# Patient Record
Sex: Female | Born: 1955
Health system: Southern US, Community
[De-identification: ages and names within clinical notes are randomized; demographics above are authoritative.]

## PROBLEM LIST (undated history)

## (undated) DIAGNOSIS — E785 Hyperlipidemia, unspecified: Secondary | ICD-10-CM

## (undated) DIAGNOSIS — H269 Unspecified cataract: Secondary | ICD-10-CM

## (undated) DIAGNOSIS — Z973 Presence of spectacles and contact lenses: Secondary | ICD-10-CM

## (undated) DIAGNOSIS — H409 Unspecified glaucoma: Secondary | ICD-10-CM

## (undated) DIAGNOSIS — E039 Hypothyroidism, unspecified: Secondary | ICD-10-CM

## (undated) DIAGNOSIS — T148XXA Other injury of unspecified body region, initial encounter: Secondary | ICD-10-CM

## (undated) DIAGNOSIS — E559 Vitamin D deficiency, unspecified: Secondary | ICD-10-CM

## (undated) DIAGNOSIS — J45909 Unspecified asthma, uncomplicated: Secondary | ICD-10-CM

## (undated) DIAGNOSIS — C801 Malignant (primary) neoplasm, unspecified: Secondary | ICD-10-CM

## (undated) DIAGNOSIS — T8859XA Other complications of anesthesia, initial encounter: Secondary | ICD-10-CM

## (undated) DIAGNOSIS — E119 Type 2 diabetes mellitus without complications: Secondary | ICD-10-CM

## (undated) DIAGNOSIS — T7840XA Allergy, unspecified, initial encounter: Secondary | ICD-10-CM

## (undated) DIAGNOSIS — G629 Polyneuropathy, unspecified: Secondary | ICD-10-CM

## (undated) DIAGNOSIS — G5600 Carpal tunnel syndrome, unspecified upper limb: Secondary | ICD-10-CM

## (undated) DIAGNOSIS — Z87442 Personal history of urinary calculi: Secondary | ICD-10-CM

## (undated) DIAGNOSIS — K219 Gastro-esophageal reflux disease without esophagitis: Secondary | ICD-10-CM

## (undated) DIAGNOSIS — T4145XA Adverse effect of unspecified anesthetic, initial encounter: Secondary | ICD-10-CM

## (undated) DIAGNOSIS — I10 Essential (primary) hypertension: Secondary | ICD-10-CM

## (undated) HISTORY — DX: Vitamin D deficiency, unspecified: E55.9

## (undated) HISTORY — DX: Malignant (primary) neoplasm, unspecified: C80.1

## (undated) HISTORY — DX: Unspecified asthma, uncomplicated: J45.909

## (undated) HISTORY — DX: Carpal tunnel syndrome, unspecified upper limb: G56.00

## (undated) HISTORY — DX: Gastro-esophageal reflux disease without esophagitis: K21.9

## (undated) HISTORY — DX: Type 2 diabetes mellitus without complications: E11.9

## (undated) HISTORY — PX: APPENDECTOMY: SHX54

## (undated) HISTORY — DX: Polyneuropathy, unspecified: G62.9

## (undated) HISTORY — DX: Unspecified glaucoma: H40.9

## (undated) HISTORY — PX: TONSILLECTOMY: SUR1361

## (undated) HISTORY — DX: Hypothyroidism, unspecified: E03.9

## (undated) HISTORY — PX: TUBAL LIGATION: SHX77

## (undated) HISTORY — PX: ABDOMINAL HYSTERECTOMY: SHX81

## (undated) HISTORY — DX: Allergy, unspecified, initial encounter: T78.40XA

## (undated) HISTORY — DX: Hyperlipidemia, unspecified: E78.5

---
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
## 1979-03-21 HISTORY — PX: INNER EAR SURGERY: SHX679

## 2000-10-08 ENCOUNTER — Ambulatory Visit (HOSPITAL_COMMUNITY): Admission: RE | Admit: 2000-10-08 | Discharge: 2000-10-08 | Payer: Self-pay | Admitting: Family Medicine

## 2000-10-08 ENCOUNTER — Encounter: Payer: Self-pay | Admitting: Family Medicine

## 2001-04-08 ENCOUNTER — Ambulatory Visit (HOSPITAL_COMMUNITY): Admission: RE | Admit: 2001-04-08 | Discharge: 2001-04-08 | Payer: Self-pay | Admitting: Family Medicine

## 2001-04-08 ENCOUNTER — Encounter: Payer: Self-pay | Admitting: Family Medicine

## 2004-03-20 HISTORY — PX: CARDIAC CATHETERIZATION: SHX172

## 2004-06-22 ENCOUNTER — Emergency Department (HOSPITAL_COMMUNITY): Admission: EM | Admit: 2004-06-22 | Discharge: 2004-06-22 | Payer: Self-pay | Admitting: Emergency Medicine

## 2004-06-23 ENCOUNTER — Ambulatory Visit: Payer: Self-pay | Admitting: Orthopedic Surgery

## 2004-07-20 ENCOUNTER — Ambulatory Visit: Payer: Self-pay | Admitting: Orthopedic Surgery

## 2004-08-22 ENCOUNTER — Ambulatory Visit: Payer: Self-pay | Admitting: Orthopedic Surgery

## 2005-01-30 ENCOUNTER — Ambulatory Visit: Payer: Self-pay | Admitting: Cardiology

## 2005-01-30 ENCOUNTER — Inpatient Hospital Stay (HOSPITAL_COMMUNITY): Admission: EM | Admit: 2005-01-30 | Discharge: 2005-01-31 | Payer: Self-pay | Admitting: Emergency Medicine

## 2005-01-31 ENCOUNTER — Ambulatory Visit: Payer: Self-pay | Admitting: Cardiovascular Disease

## 2005-01-31 ENCOUNTER — Ambulatory Visit (HOSPITAL_COMMUNITY): Admission: AD | Admit: 2005-01-31 | Discharge: 2005-02-01 | Payer: Self-pay | Admitting: Cardiovascular Disease

## 2005-02-14 ENCOUNTER — Ambulatory Visit: Payer: Self-pay | Admitting: Cardiology

## 2005-03-15 ENCOUNTER — Ambulatory Visit (HOSPITAL_COMMUNITY): Admission: RE | Admit: 2005-03-15 | Discharge: 2005-03-15 | Payer: Self-pay | Admitting: Family Medicine

## 2005-12-08 ENCOUNTER — Ambulatory Visit (HOSPITAL_COMMUNITY): Admission: RE | Admit: 2005-12-08 | Discharge: 2005-12-08 | Payer: Self-pay | Admitting: Family Medicine

## 2007-01-25 ENCOUNTER — Emergency Department (HOSPITAL_COMMUNITY): Admission: EM | Admit: 2007-01-25 | Discharge: 2007-01-25 | Payer: Self-pay | Admitting: Emergency Medicine

## 2007-03-21 HISTORY — PX: COLONOSCOPY: SHX174

## 2007-09-19 ENCOUNTER — Ambulatory Visit: Payer: Self-pay | Admitting: Gastroenterology

## 2007-10-08 ENCOUNTER — Encounter: Payer: Self-pay | Admitting: Gastroenterology

## 2007-10-08 ENCOUNTER — Ambulatory Visit: Payer: Self-pay | Admitting: Gastroenterology

## 2007-10-08 ENCOUNTER — Ambulatory Visit (HOSPITAL_COMMUNITY): Admission: RE | Admit: 2007-10-08 | Discharge: 2007-10-08 | Payer: Self-pay | Admitting: Gastroenterology

## 2009-04-12 ENCOUNTER — Ambulatory Visit (HOSPITAL_COMMUNITY): Admission: RE | Admit: 2009-04-12 | Discharge: 2009-04-12 | Payer: Self-pay | Admitting: Family Medicine

## 2009-04-20 ENCOUNTER — Ambulatory Visit (HOSPITAL_COMMUNITY): Admission: RE | Admit: 2009-04-20 | Discharge: 2009-04-20 | Payer: Self-pay | Admitting: Family Medicine

## 2009-10-18 HISTORY — PX: OTHER SURGICAL HISTORY: SHX169

## 2009-10-27 ENCOUNTER — Encounter: Admission: RE | Admit: 2009-10-27 | Discharge: 2009-12-16 | Payer: Self-pay | Admitting: Orthopedic Surgery

## 2010-04-10 ENCOUNTER — Encounter: Payer: Self-pay | Admitting: Family Medicine

## 2010-05-26 ENCOUNTER — Other Ambulatory Visit (HOSPITAL_COMMUNITY): Payer: Self-pay | Admitting: Family Medicine

## 2010-05-26 DIAGNOSIS — Z139 Encounter for screening, unspecified: Secondary | ICD-10-CM

## 2010-05-30 ENCOUNTER — Ambulatory Visit (HOSPITAL_COMMUNITY)
Admission: RE | Admit: 2010-05-30 | Discharge: 2010-05-30 | Disposition: A | Discharge status: Home / Self Care | Payer: Medicare Other | Source: Ambulatory Visit | Attending: Family Medicine | Admitting: Family Medicine

## 2010-05-30 DIAGNOSIS — Z1231 Encounter for screening mammogram for malignant neoplasm of breast: Secondary | ICD-10-CM | POA: Insufficient documentation

## 2010-05-30 DIAGNOSIS — Z139 Encounter for screening, unspecified: Secondary | ICD-10-CM

## 2010-08-02 NOTE — Op Note (Signed)
NAMEDELLANIRA, Maria [name]                ACCOUNT NO.:  [id number]   MEDICAL RECORD NO.:  [id number]          PATIENT TYPE:  AMB   LOCATION:  DAY                           FACILITY:  [facility]   PHYSICIAN:  Kassie Mends, M.D.      DATE OF BIRTH:  September 02, 1955   DATE OF PROCEDURE:  10/08/2007  DATE OF DISCHARGE:                                PROCEDURE NOTE   REFERRING PHYSICIAN:  Donna Bernard, MD   PROCEDURE:  1. Colonoscopy.  2. Esophagogastroduodenoscopy with cold forceps biopsy.   INDICATION FOR EXAM:  Maria [name] is a 55 year old female who presents  with diabetes for 12 years.  She has neuropathy in her feet.  She takes  3-4 Reglan per week for her nausea.  Her nausea primarily occurs in the  morning.  Her heartburn is controlled with her omeprazole.  Her meds  make her nauseous.  She denies any vomiting.   FINDINGS:  1. Tortuous colon requiring multiple changes in position to achieve      successful intubation of the cecum.  Otherwise, no polyps, masses,      inflammatory changes, diverticula or AVMs seen.  Slightly thickened      folds in the sigmoid colon.  Normal retroflexed view of the rectum.  2. Normal esophagus without evidence of Barrett's mass, erosion,      ulceration, or stricture.  3. Patchy erythema in the proximal stomach and the antrum without      erosion or ulceration.  Biopsies obtained via cold forceps to      evaluate for H. pylori gastritis.  4. Normal duodenal bulb and second portion of the duodenum.   DIAGNOSIS:  Mild gastritis, which may be contributing to Maria [name]'  nausea.  The differential diagnosis includes gastroparesis.   RECOMMENDATIONS:  1. She should follow a lactose-free diet to address whether or not her      loose stools may be related to dairy products.  She was given a      handout on lactose-free diet.  She also is to follow a high-fiber      diet.  She was given a handout on high-fiber diet and gastritis.  2. She should continue the  omeprazole.  3. Will follow up with the results of her biopsies.  If her biopsies      reveal no evidence of H. pylori gastritis, then we will order a      gastric emptying study.  4. No aspirin, NSAIDs, or anticoagulation for 5 days.  5. Follow up in 6 weeks with Lorenza Burton regarding her nausea and      loose stools.   MEDICATIONS:  1. Demerol 125 mg IV.  2. Versed 6 mg IV.   PROCEDURE TECHNIQUE:  Physical exam was performed.  Informed consent was  obtained.  The patient explained the benefits, risks and alternatives to  the procedure.  The patient connected to monitor and placed in left  lateral position.  Continuous oxygen was provided via nasal cannula and  IV medicine administered through an indwelling cannula.  After  administration of  sedation and rectal exam, the patient's rectum was  intubated and the scope advanced under direct visualization to the  cecum.  This scope was removed slowly by carefully examining the color,  texture, anatomy and integrity of the mucosa on the way out.   After the colonoscopy, the patient's esophagus was intubated with a  diagnostic gastroscope and the scope was advanced under direct  visualization to the second portion of the duodenum.  This scope was  removed slowly by carefully examining the color, texture, anatomy and  integrity of the mucosa on the way out.  The patient was recovered in  endoscopy and discharged home in satisfactory condition.   PATH:  Reactive gastropathy. Need gastric emptying study to complete  evaluation for nausea. Continue PPI.      Kassie Mends, M.D.  Electronically Signed     SM/MEDQ  D:  10/08/2007  T:  10/08/2007  Job:  [id number]   cc:   Donna Bernard, M.D.  Fax: [phone]

## 2010-08-02 NOTE — Consult Note (Signed)
NAMENARCISSA, MELDER                ACCOUNT NO.:  [id number]   MEDICAL RECORD NO.:  [id number]          PATIENT TYPE:  AMB   LOCATION:  DAY                           FACILITY:  [facility]   PHYSICIAN:  Kassie Mends, M.D.      DATE OF BIRTH:  06-13-1955   DATE OF CONSULTATION:  DATE OF DISCHARGE:                                 CONSULTATION   REFERRING PHYSICIAN:  W. Simone Curia, MD   CHIEF COMPLAINT:  Chronic nausea.   HISTORY OF PRESENT ILLNESS:  Ms. Griffith is a 55 year old Caucasian  female.  She describes a 5-month history of nausea.  She tells me after  she takes her medications first thing in the morning, she has a  significant nausea which worsens throughout the day.  It, around  suppertime, is quite unbearable.  She never has any vomiting, however.  She does have some crawling irritation to her epigastrium  occasionally.  It is not necessarily associated with the nausea.  She is  on omeprazole 40 mg daily and has been for a couple of years.  She has a  history of chronic GERD.  She has been diabetic for about 12 years now.  Her blood sugars do seem to be well controlled at this time, but she has  been skipping her medications regularly due to her nausea.  Her bowel  movements are irregular.  She notes a loose stools a lot.  She has been  having to take Lomotil for about 4 years, and she can have anywhere from  4-6 loose bowel movements per day.  She denies any rectal bleeding or  melena.  She does describe breakthrough heartburn and indigestion.  She  notes early satiety as well.  She tells me her weight has remained  stable.  Her appetite is generally good.  She did have laboratory  studies on July 10, 2007, which showed an alkaline phosphatase of 178  and otherwise normal LFTs.  She did have a trial of Reglan 5 mg q.6 h.  which does seem to help her nausea significantly.   PAST MEDICAL AND SURGICAL HISTORY:  She has a history of hypothyroidism,  was recently checked by Dr.  Gerda Diss.  She has diabetic neuropathy.  She  has history of ear surgery, tonsillectomy, partial hysterectomy with  right salpingo-oophorectomy, and she has had tubal ligation.   CURRENT MEDICATIONS:  1. Metformin 1 g b.i.d.  2. Glipizide 10 mg daily.  3. Lantus 44 units nightly.  4. Levothroid 125 mcg daily.  5. Lipitor 80 mg daily.  6. Gabapentin 200 mg t.i.d.  7. Loratadine 10 mg daily.  8. Omeprazole 40 mg daily.  9. Lomotil p.r.n.  10.Carafate 1 g a.c. and nightly.  11.Reglan 5 mg q.6 h. p.r.n.  12.Sodium bicarbonate 650 mg 2 p.o. p.r.n.  13.Xanax 0.5 mg nightly.  14.Travatan 0.004 mg daily eye drop.  15.Albuterol 90 mcg 2 puffs p.r.n.  16.Advair Discus 250/50 1 puff b.i.d.  17.Albuterol nebulizers p.r.n.   ALLERGIES:  No known drug allergies.   FAMILY HISTORY:  There is no known family history  of colorectal  carcinoma or other chronic GI problems.  Mother deceased at age 12 with  history of diabetes mellitus and pneumonia.  Father is alive with  history of hyperlipidemia, prostate carcinoma, and diabetes mellitus.  She has 2 healthy brothers.   SOCIAL HISTORY:  Ms. Stehr is married.  She has 2 healthy children.  She is disabled.  Previously worked in Designer, fashion/clothing.  She denies any  tobacco, alcohol, or drug use.   REVIEW OF SYSTEMS:  See HPI, otherwise negative.   PHYSICAL EXAMINATION:  VITAL SIGNS:  Weight of 180 pounds, height 5 feet  3 inches, temperature 97.5, blood pressure 122/82, and pulse of 96.  GENERAL:  She is a well-developed, well-nourished, Caucasian female in  no acute distress.  HEENT:  Sclerae are anicteric.  Conjunctivae pink.  Oropharynx pink and  moist without any lesions.  NECK:  Supple without mass or thyromegaly.  HEART:  Regular rate and rhythm.  Normal S1 and S2 without murmurs,  clicks, rubs, or gallops.  LUNGS:  Clear to auscultation bilaterally.  ABDOMEN:  Positive bowel sounds x4.  Protuberant.  No bruits  auscultated.  Soft, nontender,  and nondistended without palpable mass or  hepatosplenomegaly.  No rebound, tenderness, or guarding.  EXTREMITIES:  Without clubbing or edema bilaterally.  SKIN:  Pink, warm, and dry without any rash or jaundice.   IMPRESSION:  Ms. Matthies is a 55 year old Caucasian female with history  of diabetes mellitus who has a 47-month history of chronic nausea without  emesis.  She does occasionally have a crawling irritated epigastric  pain which is not always associated with nausea and history of chronic  gastroesophageal reflux disease.  I do feel she should have an EGD to  rule out peptic ulcer disease or gastritis.  However, she may have  diabetic gastroparesis as the culprit of her symptoms.  Interestingly,  she has an elevated alkaline phosphatase as well, not sure whether this  is related to liver or bone.   She does have chronic intermittent diarrhea for the last 4 years,  differentials include irritable bowel syndrome versus diabetic diarrhea.   PLAN:  1. Colonoscopy and EGD with Dr. Cira Servant in the near future.  I discussed      the procedures including risks and benefits including but not      limited to bleeding, infection, perforation, and drug reaction.      She agrees to the plan and consent will be obtained.  She is to      take half of her metformin and glipizide the night prior to the      colonoscopy as well as half of her dose of Lantus which will be 22      units at bedtime the night prior to the procedure.  She is to bring      her medications to endoscopy for the day of the procedure and take      them after the first early morning in endoscopy appointment per Dr.      Cira Servant' recommendations.  2. If EGD is normal, we would pursue further evaluation her for      gastroparesis in the way of gastric emptying study.   Thank you Dr. Gerda Diss for allowing [place] to participate in the care of Ms.  Fujimoto.      Lorenza Burton, N.P.      Kassie Mends, M.D.  Electronically  Signed    KJ/MEDQ  D:  09/19/2007  T:  09/20/2007  Job:  [id number]   cc:   Donna Bernard, M.D.  Fax: [phone]

## 2010-08-05 NOTE — Cardiovascular Report (Signed)
Maria [name], Maria [name]                ACCOUNT NO.:  [id number]   MEDICAL RECORD NO.:  [id number]          PATIENT TYPE:  OIB   LOCATION:  6526                         FACILITY:  [facility]   PHYSICIAN:  Charlton Haws, M.D.     DATE OF BIRTH:  04/20/1955   DATE OF PROCEDURE:  01/31/2005  DATE OF DISCHARGE:                              CARDIAC CATHETERIZATION   INDICATIONS:  Chest pain, multiple coronary risk factors.   [name] is a 55 year old patient with diabetes. She had a resting  tachycardia prior to the case. I did not give her beta blockers due to her  history of asthma.   Cine catheterization done from right femoral artery.   Left main coronary was normal.   Proximal mid-LAD were normal. The distal LAD was a small vessel without  focal stenosis. First diagonal branch was normal.   Circumflex coronary artery is nondominant and normal.   Right coronary artery was dominant and normal.   RAO VENTRICULOGRAPHY:  RAO ventriculography was normal. EF was 60%. There  was no gradient across the aortic valve. No MR. Aortic pressure was 100/64,  LV pressure was 100/6.   IMPRESSION:  The patient does not have significant coronary artery disease.  She does have a resting tachycardia. I am not sure if this is due to  autonomic neuropathy from her diabetes. We will check a TSH and a T4. I  think she should have formal PFTs pre and post bronchodilator to further  assess her asthma.   The patient tolerated the procedure well.           ______________________________  Charlton Haws, M.D.     PN/MEDQ  D:  01/31/2005  T:  01/31/2005  Job:  [id number]   cc:   Donna Bernard, M.D.  Fax: [phone]   [place] Bing, M.D. [facility]  [address]  [address]  [place]  [place] [address]   [facility]

## 2010-08-05 NOTE — Discharge Summary (Signed)
NAMESHYKERIA, Maria [name] NO.:  [id number]   MEDICAL RECORD NO.:  [id number]           PATIENT TYPE:   LOCATION:  6526                         FACILITY:  [facility]   PHYSICIAN:  [place] Bing, M.D. [facility] OF BIRTH:  1956/02/04   DATE OF ADMISSION:  01/31/2005  DATE OF DISCHARGE:  02/01/2005                                 DISCHARGE SUMMARY   PRIMARY CARDIOLOGIST:  [place] Bing, MD, [facility].   PRIMARY CARE PHYSICIAN:  Donna Bernard, MD.   CHIEF COMPLAINT:  Chest pain, felt noncardiac in nature.   SECONDARY DIAGNOSES:  1.  Diabetes.  2.  Hypothyroidism.  3.  Hyperlipidemia.  4.  Peripheral neuropathy.  5.  Chronic pain.  6.  Family history of coronary artery disease.  7.  Status remote hysterectomy.  8.  Status cervical cancer surgery.  9.  Status tibial fracture repair.  10. Remote history of tobacco use.  11. Asthma.   PROCEDURES:  1.  Cardiac catheterization.  2.  Coronary arteriogram.  3.  Left ventriculogram.   HOSPITAL COURSE:  Ms. Mcfarlane is a 55 year old female with no known history  of coronary artery disease.  She has multiple cardiac risk factors and went  to [facility] with chest pain.  She was evaluated by Dr. Gerda Diss and  admitted.  Dr. Dietrich Pates saw her in consultation and felt that further  evaluation was indicated with catheterization.  She was transferred to [facility] for the procedure.   The cardiac catheterization showed normal coronary arteries and an EF of  60%.  Dr. Eden Emms recommended testing thyroid function studies and also PFTs.  Her D-dimer was tested and was negative.   On February 01, 2005, she was seen by Dr. Dietrich Pates.  He felt that since she  had a negative D-dimer and her cath was clean, she was stable for discharge.  She is to use an Albuterol inhaler p.r.n. for chest tightness, which she  has, and she is to follow up as an outpatient with Cardiology and with Dr.  Gerda Diss.  Ms. Slevin was considered stable  for discharge on February 01, 2005, with outpatient followup arranged.   DISCHARGE INSTRUCTIONS:  1.  Her activity level to be restricted for the next few days.  2.  She is to call the office for problems with the cath site.  3.  She is to stick to a low-fat diet.  4.  She is to follow up with Dr. Marvel Plan PA on November 28th at 1 p.m.      and with Dr. Gerda Diss as needed.   DISCHARGE MEDICATIONS:  1.  Atorvastatin 20 mg a day.  2.  Protonix 40 mg a day.  3.  Avandia 8 mg a day.  4.  Glucotrol 20 mg a day.  5.  Metformin 1 gm b.i.d., restart February 03, 2005.  6.  Advair b.i.d.  7.  Albuterol p.r.n.  8.  Xanax q.h.s.  9.  Synthroid 0.1 mg a day.      Theodore Demark, P.A. [facility]      Sidon Bing, M.D. [facility]  Electronically Signed    RB/MEDQ  D:  02/01/2005  T:  02/01/2005  Job:  [id number]   cc:   Thaxton Bing, M.D. [facility]  [address]  [address]  [place]  [place] [address]   Donna Bernard, M.D.  Fax: [phone]

## 2010-08-05 NOTE — H&P (Signed)
Maria [name], Maria [name]                ACCOUNT NO.:  [id number]   MEDICAL RECORD NO.:  [id number]          PATIENT TYPE:  INP   LOCATION:  A219                          FACILITY:  [facility]   PHYSICIAN:  Donna Bernard, M.D.DATE OF BIRTH:  02-17-1956   DATE OF ADMISSION:  01/30/2005  DATE OF DISCHARGE:  LH                                HISTORY & PHYSICAL   CHIEF COMPLAINT:  Chest pain.   SUBJECTIVE:  This patient is a 55 year old white female with a history of  type 2 diabetes, hyperlipidemia, peripheral neuropathy, hypothyroidism,  asthma, and chronic painful neuropathy who presents to the hospital the day  of admission with complaints of chest pain.  The patient describes it as a  deep pressure in the substernal area of the chest, comes and goes in  severity, at times, has been quite severe.  The patient has some shortness  of breath with it.  No nausea, no diaphoresis.  It does not appear to be  particularly triggered by exertion, but it does not seem to help it either.  The patient notes no stress.  She does note she has had a fair amount of  reflux symptoms recently.  She is compliant with all of her medications  which include, Glucotrol XL 20 mg daily, Levothroid 120 mcg daily, Avandia 8  mg daily, Lipitor 80 mg q.h.s., Glucophage 1000 mg p.o. b.i.d., Ventolin two  sprays q.4h.p.r.n., Protonix 40 mg daily, nortriptyline 50 mg q.h.s., Xanax  0.5 mg q.h.s. p.r.n., Advair 250/50 one puff b.i.d.   ALLERGIES:  No known drug allergies.   FAMILY HISTORY:  Positive for coronary artery disease, diabetes.   PAST SURGICAL HISTORY:  1.  Remote hysterectomy.  2.  Tibial fracture.  3.  Cervical cancer operation.   SOCIAL HISTORY:  The patient is married.  Quit smoking in 1999.  No alcohol  use.  Two children.   REVIEW OF SYSTEMS:  Otherwise negative.   PHYSICAL EXAMINATION:  VITAL SIGNS:  Blood pressure 120/70, afebrile.  GENERAL:  No acute distress.  HEENT:  Normal fundi and disks  sharp.  TM's normal.  Pharynx normal.  NECK:  Supple.  LUNGS:  Clear.  HEART:  Regular rate and rhythm.  No chest wall tenderness.  ABDOMEN:  Mild epigastric tenderness, no rebound, no guarding, obesity  noted.  EXTREMITIES:  Feet diminished sensation bilaterally.  Pulses intact.  Reflexes intact.   LABORATORY DATA:  Cardiac enzymes negative.  Initial EKG normal sinus rhythm  with nonspecific ST changes in V1 through V3.  Other labs, please see  computer.   IMPRESSION:  1.  Chest pain.  Potentially unstable.  Angina by description.  No evidence      of myocardial damage at this point.  2.  Type 2 diabetes.  3.  Hyperlipidemia.  4.  Asth;ma.  5.  Diabetic neuropathy.   PLAN:  1.  Cardiology consultation.  2.  Lovenox b.i.d.  3.  Serial cardiac enzymes.  4.  Nasal cannula O2.  5.  Further orders as noted in the chart.      W.  Simone Curia, M.D.  Electronically Signed     WSL/MEDQ  D:  01/30/2005  T:  01/30/2005  Job:  [id number]

## 2010-08-05 NOTE — Procedures (Signed)
NAMEMERICA, PRELL                ACCOUNT NO.:  [id number]   MEDICAL RECORD NO.:  [id number]          PATIENT TYPE:  OUT   LOCATION:  RESP                          FACILITY:  [facility]   PHYSICIAN:  Edward L. Juanetta Gosling, M.D.DATE OF BIRTH:  [birth year]   DATE OF PROCEDURE:  03/15/2005  DATE OF DISCHARGE:                              PULMONARY FUNCTION TEST   1.  Spirometry shows a minimal ventilatory defect with airflow obstruction      that is most marked in the area of the smaller airways.  2.  Lung volumes are normal.  3.  Diffusion capacity of carbon monoxide is slightly minimally reduced.      This study is consistent with asthma with small airway dysfunction from      cigarette smoking.      Edward L. Juanetta Gosling, M.D.  Electronically Signed     ELH/MEDQ  D:  03/15/2005  T:  03/15/2005  Job:  [id number]   cc:   Lorin Picket A. Gerda Diss, MD  Fax: [phone]

## 2011-05-03 ENCOUNTER — Other Ambulatory Visit: Payer: Self-pay | Admitting: Family Medicine

## 2011-05-03 DIAGNOSIS — Z139 Encounter for screening, unspecified: Secondary | ICD-10-CM

## 2011-06-01 ENCOUNTER — Ambulatory Visit (HOSPITAL_COMMUNITY)
Admission: RE | Admit: 2011-06-01 | Discharge: 2011-06-01 | Disposition: A | Discharge status: Home / Self Care | Payer: Medicare Other | Source: Ambulatory Visit | Attending: Family Medicine | Admitting: Family Medicine

## 2011-06-01 DIAGNOSIS — Z1231 Encounter for screening mammogram for malignant neoplasm of breast: Secondary | ICD-10-CM | POA: Insufficient documentation

## 2011-06-01 DIAGNOSIS — Z139 Encounter for screening, unspecified: Secondary | ICD-10-CM

## 2012-03-04 ENCOUNTER — Other Ambulatory Visit: Payer: Self-pay | Admitting: Family Medicine

## 2012-03-04 DIAGNOSIS — D179 Benign lipomatous neoplasm, unspecified: Secondary | ICD-10-CM

## 2012-03-20 LAB — HM DIABETES EYE EXAM

## 2012-03-25 ENCOUNTER — Ambulatory Visit (HOSPITAL_COMMUNITY)
Admission: RE | Admit: 2012-03-25 | Discharge: 2012-03-25 | Disposition: A | Discharge status: Home / Self Care | Payer: Medicare Other | Source: Ambulatory Visit | Attending: Family Medicine | Admitting: Family Medicine

## 2012-03-25 ENCOUNTER — Other Ambulatory Visit: Payer: Self-pay | Admitting: Family Medicine

## 2012-03-25 DIAGNOSIS — D179 Benign lipomatous neoplasm, unspecified: Secondary | ICD-10-CM

## 2012-03-25 DIAGNOSIS — R222 Localized swelling, mass and lump, trunk: Secondary | ICD-10-CM | POA: Insufficient documentation

## 2012-04-29 ENCOUNTER — Other Ambulatory Visit (HOSPITAL_COMMUNITY): Payer: Self-pay | Admitting: Family Medicine

## 2012-04-29 DIAGNOSIS — Z139 Encounter for screening, unspecified: Secondary | ICD-10-CM

## 2012-05-10 ENCOUNTER — Encounter (HOSPITAL_COMMUNITY): Payer: Self-pay | Admitting: Pharmacy Technician

## 2012-05-17 NOTE — Patient Instructions (Addendum)
Maria [name]  05/17/2012   Your procedure is scheduled on:   05/24/2012  Report to [facility] at  0730 AM.  Call this number if you have problems the morning of surgery: [phone]   Remember:   Do not eat food or drink liquids after midnight.   Take these medicines the morning of surgery with A SIP OF WATER:  Xanax,norco,synthroid,reglan,singulair,prilosec.Take 1/2 Lantus dose night before your procedure. Take your advair and singulair before you come.   Do not wear jewelry, make-up or nail polish.  Do not wear lotions, powders, or perfumes.   Do not shave 48 hours prior to surgery. Men may shave face and neck.  Do not bring valuables to the hospital.  Contacts, dentures or bridgework may not be worn into surgery.  Leave suitcase in the car. After surgery it may be brought to your room.  For patients admitted to the hospital, checkout time is 11:00 AM the day of discharge.   Patients discharged the day of surgery will not be allowed to drive  home.  Name and phone number of your driver: family  Special Instructions: Shower using CHG 2 nights before surgery and the night before surgery.  If you shower the day of surgery use CHG.  Use special wash - you have one bottle of CHG for all showers.  You should use approximately 1/3 of the bottle for each shower.   Please read over the following fact sheets that you were given: Pain Booklet, Coughing and Deep Breathing, MRSA Information, Surgical Site Infection Prevention, Anesthesia Post-op Instructions and Care and Recovery After Surgery Lipoma A lipoma is a noncancerous (benign) tumor composed of fat cells. They are usually found under the skin (subcutaneous). A lipoma may occur in any tissue of the body that contains fat. Common areas for lipomas to appear include the back, shoulders, buttocks, and thighs. Lipomas are a very common soft tissue growth. They are soft and grow slowly. Most problems caused by a lipoma depend on where it is  growing. DIAGNOSIS  A lipoma can be diagnosed with a physical exam. These tumors rarely become cancerous, but radiographic studies can help determine this for certain. Studies used may include:  Computerized X-ray scans (CT or CAT scan).  Computerized magnetic scans (MRI). TREATMENT  Small lipomas that are not causing problems may be watched. If a lipoma continues to enlarge or causes problems, removal is often the best treatment. Lipomas can also be removed to improve appearance. Surgery is done to remove the fatty cells and the surrounding capsule. Most often, this is done with medicine that numbs the area (local anesthetic). The removed tissue is examined under a microscope to make sure it is not cancerous. Keep all follow-up appointments with your caregiver. SEEK MEDICAL CARE IF:   The lipoma becomes larger or hard.  The lipoma becomes painful, red, or increasingly swollen. These could be signs of infection or a more serious condition. Document Released: 02/24/2002 Document Revised: 05/29/2011 Document Reviewed: 08/06/2009 [facility] Patient Information 2013 [place], [place]. PATIENT INSTRUCTIONS POST-ANESTHESIA  IMMEDIATELY FOLLOWING SURGERY:  Do not drive or operate machinery for the first twenty four hours after surgery.  Do not make any important decisions for twenty four hours after surgery or while taking narcotic pain medications or sedatives.  If you develop intractable nausea and vomiting or a severe headache please notify your doctor immediately.  FOLLOW-UP:  Please make an appointment with your surgeon as instructed. You do not need  to follow up with anesthesia unless specifically instructed to do so.  WOUND CARE INSTRUCTIONS (if applicable):  Keep a dry clean dressing on the anesthesia/puncture wound site if there is drainage.  Once the wound has quit draining you may leave it open to air.  Generally you should leave the bandage intact for twenty four hours unless there is  drainage.  If the epidural site drains for more than 36-48 hours please call the anesthesia department.  QUESTIONS?:  Please feel free to call your physician or the hospital operator if you have any questions, and they will be happy to assist you.

## 2012-05-18 ENCOUNTER — Encounter: Payer: Self-pay | Admitting: *Deleted

## 2012-05-20 ENCOUNTER — Encounter (HOSPITAL_COMMUNITY)
Admission: RE | Admit: 2012-05-20 | Discharge: 2012-05-20 | Disposition: A | Discharge status: Home / Self Care | Payer: Medicare Other | Source: Ambulatory Visit | Attending: General Surgery | Admitting: General Surgery

## 2012-05-24 ENCOUNTER — Encounter (HOSPITAL_COMMUNITY): Admission: RE | Payer: Self-pay | Source: Ambulatory Visit

## 2012-05-24 ENCOUNTER — Ambulatory Visit (HOSPITAL_COMMUNITY): Admission: RE | Admit: 2012-05-24 | Payer: Medicare Other | Source: Ambulatory Visit | Admitting: General Surgery

## 2012-05-24 SURGERY — EXCISION LIPOMA
Anesthesia: General | Site: Back | Laterality: Right

## 2012-06-01 ENCOUNTER — Encounter: Payer: Self-pay | Admitting: *Deleted

## 2012-06-03 ENCOUNTER — Ambulatory Visit (HOSPITAL_COMMUNITY)
Admission: RE | Admit: 2012-06-03 | Discharge: 2012-06-03 | Disposition: A | Discharge status: Home / Self Care | Payer: Medicare Other | Source: Ambulatory Visit | Attending: Family Medicine | Admitting: Family Medicine

## 2012-06-03 DIAGNOSIS — Z1231 Encounter for screening mammogram for malignant neoplasm of breast: Secondary | ICD-10-CM | POA: Insufficient documentation

## 2012-06-05 ENCOUNTER — Encounter: Payer: Self-pay | Admitting: Nurse Practitioner

## 2012-06-05 ENCOUNTER — Ambulatory Visit (INDEPENDENT_AMBULATORY_CARE_PROVIDER_SITE_OTHER): Payer: Medicare Other | Admitting: Nurse Practitioner

## 2012-06-05 VITALS — BP 160/84 | HR 100 | Ht 59.5 in | Wt 171.0 lb

## 2012-06-05 MED ORDER — LEVOTHYROXINE SODIUM 112 MCG PO TABS: 112.0000 ug | ORAL_TABLET | Freq: Every day | ORAL | Status: DC

## 2012-06-05 MED ORDER — PREDNISONE 20 MG PO TABS: ORAL_TABLET | ORAL | Status: DC

## 2012-06-06 ENCOUNTER — Encounter: Payer: Self-pay | Admitting: Nurse Practitioner

## 2012-06-06 DIAGNOSIS — E1140 Type 2 diabetes mellitus with diabetic neuropathy, unspecified: Secondary | ICD-10-CM | POA: Insufficient documentation

## 2012-06-06 DIAGNOSIS — J45909 Unspecified asthma, uncomplicated: Secondary | ICD-10-CM | POA: Insufficient documentation

## 2012-06-06 DIAGNOSIS — H409 Unspecified glaucoma: Secondary | ICD-10-CM | POA: Insufficient documentation

## 2012-06-06 DIAGNOSIS — E039 Hypothyroidism, unspecified: Secondary | ICD-10-CM | POA: Insufficient documentation

## 2012-06-06 NOTE — Progress Notes (Signed)
Subjective: Presents for her yearly preventive health physical. No pelvic pain. No chest pain or shortness of breath. Married, same sexual partner. Gets regular eye exams for her glaucoma. Was not able to get Zostavax because her insurance would not cover. Last Pneumovax October 2010. Has had her mammogram. Also lab work. Has had a flareup of her asthma the past 3-4 days. Using her inhaler at least twice per day. Continues her maintenance drugs. Last colonoscopy in 2009. Objective: NAD alert, oriented. TMs normal limit. Thyroid normal limit to palpation and nontender. Pharynx clear. Neck supple with minimal adenopathy. Lungs occasional faint expiratory wheeze noted especially posterior. No tachypnea. Color normal limit. Heart regular rate rhythm. Abdomen obese soft and nontender. Breast exam no masses. Axilla no adenopathy. EGBUS normal. Bimanual exam normal, ovaries nonpalpable. Exam limited due to abdominal girth. Rectal exam normal limit. See lab work dated 3/17 TSH was low. Assessment: #1 preventive health physical #2 hypothyroidism with abnormal TSH #3 asthma exacerbation Plan: #1Decrease levothyroxine to 112 mcg qd.  Recheck TSH in 3 months. #2 Prednisone 20 mg 6d taper.  Call back early next week if no better, call or go to ER sooner if worse.  Hemoccult cards x 3.  Recheck in 3 months.

## 2012-07-25 ENCOUNTER — Other Ambulatory Visit: Payer: Self-pay | Admitting: *Deleted

## 2012-07-25 MED ORDER — OMEPRAZOLE 20 MG PO CPDR: 40.0000 mg | DELAYED_RELEASE_CAPSULE | Freq: Every day | ORAL | Status: DC

## 2012-07-25 MED ORDER — METFORMIN HCL 500 MG PO TABS: 1000.0000 mg | ORAL_TABLET | Freq: Two times a day (BID) | ORAL | Status: DC

## 2012-07-25 MED ORDER — MONTELUKAST SODIUM 10 MG PO TABS: 10.0000 mg | ORAL_TABLET | Freq: Every day | ORAL | Status: DC

## 2012-08-22 ENCOUNTER — Ambulatory Visit: Payer: Medicare Other | Admitting: Nurse Practitioner

## 2012-09-05 ENCOUNTER — Ambulatory Visit (INDEPENDENT_AMBULATORY_CARE_PROVIDER_SITE_OTHER): Payer: Medicare Other | Admitting: Nurse Practitioner

## 2012-09-05 ENCOUNTER — Encounter: Payer: Self-pay | Admitting: Nurse Practitioner

## 2012-09-05 VITALS — BP 130/80 | Wt 171.0 lb

## 2012-09-05 DIAGNOSIS — J45909 Unspecified asthma, uncomplicated: Secondary | ICD-10-CM

## 2012-09-05 DIAGNOSIS — E039 Hypothyroidism, unspecified: Secondary | ICD-10-CM

## 2012-09-05 DIAGNOSIS — E1149 Type 2 diabetes mellitus with other diabetic neurological complication: Secondary | ICD-10-CM

## 2012-09-05 DIAGNOSIS — G589 Mononeuropathy, unspecified: Secondary | ICD-10-CM

## 2012-09-05 DIAGNOSIS — J4520 Mild intermittent asthma, uncomplicated: Secondary | ICD-10-CM

## 2012-09-05 DIAGNOSIS — E1140 Type 2 diabetes mellitus with diabetic neuropathy, unspecified: Secondary | ICD-10-CM

## 2012-09-05 DIAGNOSIS — E119 Type 2 diabetes mellitus without complications: Secondary | ICD-10-CM

## 2012-09-05 NOTE — Progress Notes (Signed)
Subjective:  Presents for routine followup. Blood sugars at home running 103-119 in the mornings. Occasionally will drop into the 90s which causes her to feel jittery with headache and feeling very tired. About 5 times per month has periods of hypoglycemia during the night which makes her feel "foggy" and sweaty. Her husband has to fix her a sandwich or snack, these episodes will go away after 20-30 minutes. Currently on 40 units of Lantus at bedtime. Tries to eat a bedtime snack each day. Blood sugars after eating have run about 180-200. No chest pain/ischemic type pain shortness of breath or edema. Asthma has been stable, tries to avoid being out in the hot weather. Limited exercise. Had her thyroid medication adjusted in March, is due for a repeat TSH.  Objective:   BP 130/80  Wt 171 lb (77.565 kg)  BMI 33.97 kg/m2 NAD. Alert, oriented. Lungs clear. Heart regular rate rhythm. Lower extremities no edema. Thyroid normal limit to palpation and nontender. No obvious masses or goiters. Hemoglobin A1c 8.5.  Assessment:Type 2 diabetes mellitus with diabetic neuropathy - Plan: Microalbumin, urine, Microalbumin, urine  Type II or unspecified type diabetes mellitus without mention of complication, not stated as uncontrolled - Plan: POCT glycosylated hemoglobin (Hb A1C)  Unspecified hypothyroidism - Plan: TSH, TSH  Asthma, chronic, mild intermittent, with acute exacerbation  Plan: Continue current medications as directed. Patient recently had her Lantus filled. Once her Lantus supply is gone, would like to try NPH insulin to see if this will help avoid nighttime hypoglycemia. Also recommend high protein bedtime snack. Part of her elevation in hemoglobin A1c was probably due to her recent prednisone taper due to asthma exacerbation. Lengthy discussion regarding her diet and exercise. Recheck in 3 months, call back sooner if any problems.

## 2012-09-06 ENCOUNTER — Encounter: Payer: Self-pay | Admitting: Nurse Practitioner

## 2012-09-06 NOTE — Assessment & Plan Note (Signed)
Plan: Continue current medications as directed. Patient recently had her Lantus filled. Once her Lantus supply is gone, would like to try NPH insulin to see if this will help avoid nighttime hypoglycemia. Also recommend high protein bedtime snack. Part of her elevation in hemoglobin A1c was probably due to her recent prednisone taper due to asthma exacerbation. Lengthy discussion regarding her diet and exercise. Recheck in 3 months, call back sooner if any problems.

## 2012-09-06 NOTE — Assessment & Plan Note (Signed)
TSH pending. 

## 2012-09-10 ENCOUNTER — Other Ambulatory Visit: Payer: Self-pay | Admitting: *Deleted

## 2012-09-10 DIAGNOSIS — E039 Hypothyroidism, unspecified: Secondary | ICD-10-CM

## 2012-09-10 DIAGNOSIS — E0590 Thyrotoxicosis, unspecified without thyrotoxic crisis or storm: Secondary | ICD-10-CM

## 2012-09-10 MED ORDER — LEVOTHYROXINE SODIUM 112 MCG PO TABS: ORAL_TABLET | ORAL | Status: DC

## 2012-09-26 ENCOUNTER — Other Ambulatory Visit: Payer: Self-pay | Admitting: *Deleted

## 2012-09-26 MED ORDER — METFORMIN HCL 500 MG PO TABS: 1000.0000 mg | ORAL_TABLET | Freq: Two times a day (BID) | ORAL | Status: DC

## 2012-09-27 ENCOUNTER — Telehealth: Payer: Self-pay | Admitting: Family Medicine

## 2012-09-27 ENCOUNTER — Other Ambulatory Visit: Payer: Self-pay | Admitting: *Deleted

## 2012-09-27 MED ORDER — HYDROCODONE-ACETAMINOPHEN 10-325 MG PO TABS: 1.0000 | ORAL_TABLET | Freq: Four times a day (QID) | ORAL | Status: DC | PRN

## 2012-09-27 NOTE — Telephone Encounter (Addendum)
Rx was already faxed to pharmacy today.

## 2012-09-27 NOTE — Telephone Encounter (Signed)
Patient said pharmacy received refills of all her medicine except her hydrocodone. She needs this refilled to [facility] Today if possible

## 2012-10-25 ENCOUNTER — Other Ambulatory Visit: Payer: Self-pay

## 2012-10-25 MED ORDER — MONTELUKAST SODIUM 10 MG PO TABS: 10.0000 mg | ORAL_TABLET | Freq: Every day | ORAL | Status: DC

## 2012-10-25 MED ORDER — OMEPRAZOLE 20 MG PO CPDR: 40.0000 mg | DELAYED_RELEASE_CAPSULE | Freq: Every day | ORAL | Status: DC

## 2012-11-05 ENCOUNTER — Encounter: Payer: Self-pay | Admitting: Family Medicine

## 2012-11-05 ENCOUNTER — Ambulatory Visit (INDEPENDENT_AMBULATORY_CARE_PROVIDER_SITE_OTHER): Payer: Medicare Other | Admitting: Family Medicine

## 2012-11-05 VITALS — BP 120/70 | Temp 98.1°F | Ht 61.0 in | Wt 169.0 lb

## 2012-11-05 DIAGNOSIS — L0390 Cellulitis, unspecified: Secondary | ICD-10-CM

## 2012-11-05 MED ORDER — KETOCONAZOLE 2 % EX CREA: TOPICAL_CREAM | Freq: Two times a day (BID) | CUTANEOUS | Status: DC

## 2012-11-05 MED ORDER — DOXYCYCLINE HYCLATE 100 MG PO CAPS: 100.0000 mg | ORAL_CAPSULE | Freq: Two times a day (BID) | ORAL | Status: DC

## 2012-11-05 NOTE — Patient Instructions (Signed)
Cellulitis Cellulitis is an infection of the skin and the tissue beneath it. The infected area is usually red and tender. Cellulitis occurs most often in the arms and lower legs.  CAUSES  Cellulitis is caused by bacteria that enter the skin through cracks or cuts in the skin. The most common types of bacteria that cause cellulitis are Staphylococcus and Streptococcus. SYMPTOMS   Redness and warmth.  Swelling.  Tenderness or pain.  Fever. DIAGNOSIS  Your caregiver can usually determine what is wrong based on a physical exam. Blood tests may also be done. TREATMENT  Treatment usually involves taking an antibiotic medicine. HOME CARE INSTRUCTIONS   Take your antibiotics as directed. Finish them even if you start to feel better.  Keep the infected arm or leg elevated to reduce swelling.  Apply a warm cloth to the affected area up to 4 times per day to relieve pain.  Only take over-the-counter or prescription medicines for pain, discomfort, or fever as directed by your caregiver.  Keep all follow-up appointments as directed by your caregiver. SEEK MEDICAL CARE IF:   You notice red streaks coming from the infected area.  Your red area gets larger or turns dark in color.  Your bone or joint underneath the infected area becomes painful after the skin has healed.  Your infection returns in the same area or another area.  You notice a swollen bump in the infected area.  You develop new symptoms. SEEK IMMEDIATE MEDICAL CARE IF:   You have a fever.  You feel very sleepy.  You develop vomiting or diarrhea.  You have a general ill feeling (malaise) with muscle aches and pains. MAKE SURE YOU:   Understand these instructions.  Will watch your condition.  Will get help right away if you are not doing well or get worse. Document Released: 12/14/2004 Document Revised: 09/05/2011 Document Reviewed: 05/22/2011 [employer] Patient Information 2014 [employer].  

## 2012-11-05 NOTE — Progress Notes (Signed)
  Subjective:    Patient ID: Maria [name], female    DOB: 03-05-1956, 57 y.o.   MRN: [id number]  HPIHere for right ring finger pain and swelling. Started last Friday.  Patient relates of the ring finger become red tender painful she states that in the past she had been diagnosed with a skin fungus infection and then one time she had a bacterial infection she would like a refill on her skin fungus medication sheet econazole. She denies high fever chills denies any injury. PMH benign. Has multiple chronic health problems for which she follows up.  Review of Systems    no high fevers. Objective:   Physical Exam Lungs are clear hearts regular she does have cellulitis of the ring finger. No abscess is noted.       Assessment & Plan:  Cellulitis ring finger-do not where reaming. Doxycycline twice a day 10 days proper way to take it discuss, if high fevers or worse followup, if abscess develops followup. Recheck in 48 hours if not improving

## 2012-11-12 ENCOUNTER — Ambulatory Visit (INDEPENDENT_AMBULATORY_CARE_PROVIDER_SITE_OTHER): Payer: Medicare Other | Admitting: Family Medicine

## 2012-11-12 ENCOUNTER — Encounter: Payer: Self-pay | Admitting: Family Medicine

## 2012-11-12 VITALS — BP 148/68 | Temp 97.8°F | Ht 61.0 in | Wt 170.4 lb

## 2012-11-12 DIAGNOSIS — L0390 Cellulitis, unspecified: Secondary | ICD-10-CM

## 2012-11-12 DIAGNOSIS — L0291 Cutaneous abscess, unspecified: Secondary | ICD-10-CM

## 2012-11-12 MED ORDER — VALACYCLOVIR HCL 1 G PO TABS: 1000.0000 mg | ORAL_TABLET | Freq: Two times a day (BID) | ORAL | Status: AC

## 2012-11-12 MED ORDER — MOMETASONE FUROATE 0.1 % EX CREA: TOPICAL_CREAM | CUTANEOUS | Status: DC

## 2012-11-12 NOTE — Progress Notes (Signed)
  Subjective:    Patient ID: Maria [name], female    DOB: 20-Jun-1955, 57 y.o.   MRN: [id number]  HPI Pt back today from previous visit on 8/19 related to cellulitis in the right, ring finger. She has been taking her antibiotics and using the cream, but the cellulitis is still present. This Saturday is when it got worst to where she developed two tiny red bumps on her hand (thumb and ring finger). She says it is itchy and painful.   She is almost finished with the doxycycline and seems to be helping recently she noticed a couple small bumps.  Review of Systems She denies fevers chills she does relate moderate pain    Objective:   Physical Exam The area on the hand has a couple red spots on the back of the knuckle and on the palm there is no sign of any splinter hemorrhages no signs of significant cellulitis I feel that the patient is actually improving compared to where she was       Assessment & Plan:  It is hard to discern if this is a possibility shingles coming out or go localized reaction I would recommend Kenalog cream twice a day when necessary plus also recommend Valtrex 1 twice daily for 7 days, finish doxycycline. Keep standard followup visits

## 2012-11-28 ENCOUNTER — Other Ambulatory Visit: Payer: Self-pay

## 2012-11-28 MED ORDER — METFORMIN HCL 500 MG PO TABS: 1000.0000 mg | ORAL_TABLET | Freq: Two times a day (BID) | ORAL | Status: DC

## 2012-12-10 ENCOUNTER — Ambulatory Visit: Payer: Medicare Other | Admitting: Nurse Practitioner

## 2012-12-12 ENCOUNTER — Ambulatory Visit (INDEPENDENT_AMBULATORY_CARE_PROVIDER_SITE_OTHER): Payer: Medicare Other | Admitting: Nurse Practitioner

## 2012-12-12 ENCOUNTER — Encounter: Payer: Self-pay | Admitting: Nurse Practitioner

## 2012-12-12 VITALS — BP 122/74 | Ht 62.0 in | Wt 173.0 lb

## 2012-12-12 DIAGNOSIS — E119 Type 2 diabetes mellitus without complications: Secondary | ICD-10-CM

## 2012-12-12 DIAGNOSIS — E1149 Type 2 diabetes mellitus with other diabetic neurological complication: Secondary | ICD-10-CM

## 2012-12-12 DIAGNOSIS — Z23 Encounter for immunization: Secondary | ICD-10-CM

## 2012-12-12 DIAGNOSIS — E785 Hyperlipidemia, unspecified: Secondary | ICD-10-CM

## 2012-12-12 DIAGNOSIS — Z79899 Other long term (current) drug therapy: Secondary | ICD-10-CM

## 2012-12-12 DIAGNOSIS — G589 Mononeuropathy, unspecified: Secondary | ICD-10-CM

## 2012-12-12 DIAGNOSIS — E1140 Type 2 diabetes mellitus with diabetic neuropathy, unspecified: Secondary | ICD-10-CM

## 2012-12-12 DIAGNOSIS — E039 Hypothyroidism, unspecified: Secondary | ICD-10-CM

## 2012-12-12 LAB — HEPATIC FUNCTION PANEL
ALT: 17 U/L (ref 0–35)
AST: 16 U/L (ref 0–37)
Bilirubin, Direct: 0.2 mg/dL (ref 0.0–0.3)
Indirect Bilirubin: 0.6 mg/dL (ref 0.0–0.9)
Total Bilirubin: 0.8 mg/dL (ref 0.3–1.2)
Total Protein: 6.5 g/dL (ref 6.0–8.3)

## 2012-12-12 LAB — POCT GLYCOSYLATED HEMOGLOBIN (HGB A1C): Hemoglobin A1C: 7.3

## 2012-12-12 LAB — LIPID PANEL
Cholesterol: 191 mg/dL (ref 0–200)
Triglycerides: 106 mg/dL (ref <150)

## 2012-12-12 MED ORDER — INSULIN NPH (HUMAN) (ISOPHANE) 100 UNIT/ML ~~LOC~~ SUSP: SUBCUTANEOUS | Status: DC

## 2012-12-12 NOTE — Progress Notes (Signed)
Subjective:  Presents for recheck. Wants to try NPH insulin. Experiencing hypoglycemia mainly in AM with BS below 70 on many mornings. Was recently treated for infection in her hand. FBS this AM 216. No chest pain/ischemic type pain or shortness of breath. No edema. Has had a slight flare up of her asthma, using her albuterol to 3 times per day which relieves her symptoms. Continues to use her Advair twice a day. Is due to have her TSH rechecked, on a reduced dose of levothyroxine.  Objective:   BP 122/74  Ht 5\' 2"  (1.575 m)  Wt 173 lb (78.472 kg)  BMI 31.63 kg/m2 NAD. Alert, oriented. Lungs clear. Heart regular rate rhythm. Lower extremities no edema. Hemoglobin A1c 7.3, much improved from 8.5 3 months ago.   Assessment: Diabetes - Plan: POCT glycosylated hemoglobin (Hb A1C)  Hypothyroidism - Plan: TSH  Hyperlipemia - Plan: Lipid panel  High risk medication use - Plan: Hepatic function panel  Need for prophylactic vaccination and inoculation against influenza  Plan: Complete Lantus insulin. After this switch to NPH 32 units in the morning and 16 units at night. Monitor blood sugars closely and send results. Reviewed signs and symptoms and care for hypoglycemia. Continue current medications. Callback in 7-10 days if no improvement in her asthma. Routine followup in 3 months.

## 2012-12-13 DIAGNOSIS — E1169 Type 2 diabetes mellitus with other specified complication: Secondary | ICD-10-CM | POA: Insufficient documentation

## 2012-12-13 DIAGNOSIS — E785 Hyperlipidemia, unspecified: Secondary | ICD-10-CM | POA: Insufficient documentation

## 2012-12-13 NOTE — Assessment & Plan Note (Signed)
Recheck TSH 

## 2012-12-13 NOTE — Assessment & Plan Note (Signed)
  Plan: Complete Lantus insulin. After this switch to NPH 32 units in the morning and 16 units at night. Monitor blood sugars closely and send results. Reviewed signs and symptoms and care for hypoglycemia. Continue current medications. Callback in 7-10 days if no improvement in her asthma. Routine followup in 3 months.

## 2012-12-16 ENCOUNTER — Other Ambulatory Visit: Payer: Self-pay | Admitting: Nurse Practitioner

## 2012-12-16 MED ORDER — LEVOTHYROXINE SODIUM 88 MCG PO TABS: 88.0000 ug | ORAL_TABLET | Freq: Every day | ORAL | Status: DC

## 2012-12-31 ENCOUNTER — Other Ambulatory Visit: Payer: Self-pay | Admitting: *Deleted

## 2012-12-31 MED ORDER — ALPRAZOLAM 0.5 MG PO TABS: 0.5000 mg | ORAL_TABLET | Freq: Every evening | ORAL | Status: DC | PRN

## 2013-01-27 ENCOUNTER — Other Ambulatory Visit: Payer: Self-pay | Admitting: *Deleted

## 2013-01-27 ENCOUNTER — Telehealth: Payer: Self-pay | Admitting: Family Medicine

## 2013-01-27 MED ORDER — HYDROCODONE-ACETAMINOPHEN 10-325 MG PO TABS: 1.0000 | ORAL_TABLET | Freq: Four times a day (QID) | ORAL | Status: DC | PRN

## 2013-01-27 MED ORDER — PRAVASTATIN SODIUM 80 MG PO TABS: 80.0000 mg | ORAL_TABLET | Freq: Every day | ORAL | Status: DC

## 2013-01-27 NOTE — Telephone Encounter (Signed)
Patient needs a prescription for HYDROcodone-acetaminophen (NORCO) 10-325 MG per tablet.  States she is completely out of this medication.  Please call Patient. Thanks

## 2013-01-27 NOTE — Telephone Encounter (Signed)
I called patient and refilled prescription for HYDROcodone-acetaminophen (NORCO) 10-325 MG per tablet X 3 for her to pick up.

## 2013-01-27 NOTE — Telephone Encounter (Signed)
Write one month worth times two.

## 2013-02-04 ENCOUNTER — Telehealth: Payer: Self-pay | Admitting: Family Medicine

## 2013-02-04 NOTE — Telephone Encounter (Signed)
Pt does not like the [place], she forgets to take it. She wants to go back to the lantus where she only has to do one a day. Please call into [facility] and home care

## 2013-02-05 NOTE — Telephone Encounter (Signed)
Patient called to check on this

## 2013-02-13 NOTE — Telephone Encounter (Signed)
Call pt. Tell her I apologize for delay her phone call was at bottom and i did not see after initial review, how much insulin total is she averaging per day? And how are her first thing in the morning sugars doing?p

## 2013-02-14 MED ORDER — INSULIN GLARGINE 100 UNIT/ML ~~LOC~~ SOLN: 38.0000 [IU] | Freq: Every day | SUBCUTANEOUS | Status: DC

## 2013-02-14 NOTE — Telephone Encounter (Signed)
Rx sent electronically to [facility]. Patient notified and advised to start with 38 units at bedtime and call back in one week with sugar readings.

## 2013-02-14 NOTE — Telephone Encounter (Signed)
Ok swithc back to lantus at 38 qhs calll [place] in one wk with numbers

## 2013-02-14 NOTE — Telephone Encounter (Signed)
Patient taking 32 units in am and 16 units in pm- Morning sugars running 102-115

## 2013-03-10 ENCOUNTER — Telehealth: Payer: Self-pay | Admitting: Family Medicine

## 2013-03-10 NOTE — Telephone Encounter (Signed)
Patient needs order for blood work. °

## 2013-03-17 ENCOUNTER — Ambulatory Visit: Payer: Medicare Other | Admitting: Family Medicine

## 2013-03-17 ENCOUNTER — Other Ambulatory Visit: Payer: Self-pay | Admitting: Nurse Practitioner

## 2013-03-17 ENCOUNTER — Ambulatory Visit: Payer: Medicare Other | Admitting: Nurse Practitioner

## 2013-03-17 DIAGNOSIS — E039 Hypothyroidism, unspecified: Secondary | ICD-10-CM

## 2013-03-17 DIAGNOSIS — Z0000 Encounter for general adult medical examination without abnormal findings: Secondary | ICD-10-CM

## 2013-03-17 DIAGNOSIS — E1140 Type 2 diabetes mellitus with diabetic neuropathy, unspecified: Secondary | ICD-10-CM

## 2013-03-17 NOTE — Progress Notes (Signed)
Pt notified on voicemail that orders are ready.  

## 2013-03-24 ENCOUNTER — Encounter: Payer: Self-pay | Admitting: Nurse Practitioner

## 2013-03-24 ENCOUNTER — Ambulatory Visit (INDEPENDENT_AMBULATORY_CARE_PROVIDER_SITE_OTHER): Payer: Medicare Other | Admitting: Nurse Practitioner

## 2013-03-24 VITALS — BP 136/86 | Ht 62.0 in | Wt 174.4 lb

## 2013-03-24 DIAGNOSIS — E785 Hyperlipidemia, unspecified: Secondary | ICD-10-CM

## 2013-03-24 DIAGNOSIS — K219 Gastro-esophageal reflux disease without esophagitis: Secondary | ICD-10-CM

## 2013-03-24 DIAGNOSIS — G589 Mononeuropathy, unspecified: Secondary | ICD-10-CM

## 2013-03-24 DIAGNOSIS — E039 Hypothyroidism, unspecified: Secondary | ICD-10-CM

## 2013-03-24 DIAGNOSIS — J45909 Unspecified asthma, uncomplicated: Secondary | ICD-10-CM

## 2013-03-24 DIAGNOSIS — E1149 Type 2 diabetes mellitus with other diabetic neurological complication: Secondary | ICD-10-CM

## 2013-03-24 DIAGNOSIS — E1140 Type 2 diabetes mellitus with diabetic neuropathy, unspecified: Secondary | ICD-10-CM

## 2013-03-24 LAB — MICROALBUMIN, URINE: Microalb, Ur: 1.06 mg/dL (ref 0.00–1.89)

## 2013-03-24 LAB — TSH: TSH: 0.201 u[IU]/mL — ABNORMAL LOW (ref 0.350–4.500)

## 2013-03-24 MED ORDER — LEVOTHYROXINE SODIUM 50 MCG PO TABS: 50.0000 ug | ORAL_TABLET | Freq: Every day | ORAL | Status: DC

## 2013-03-24 MED ORDER — HYDROCODONE-ACETAMINOPHEN 10-325 MG PO TABS: 1.0000 | ORAL_TABLET | Freq: Four times a day (QID) | ORAL | Status: DC | PRN

## 2013-03-24 NOTE — Progress Notes (Signed)
Will send in new Rx for new dose of thyroid medication. Will reduce to 50 mcg. Repeat TSH in 3 months.

## 2013-03-24 NOTE — Progress Notes (Signed)
Discussed with patient. Patient verbalized understanding. 

## 2013-03-26 ENCOUNTER — Telehealth: Payer: Self-pay | Admitting: Family Medicine

## 2013-03-26 NOTE — Telephone Encounter (Signed)
[employer] called to state that patient's ALPRAZOLAM has been approved, expires 03/26/2014, they stated that the patient has been notified and a letter of approval will be sent to our office

## 2013-03-27 ENCOUNTER — Encounter: Payer: Self-pay | Admitting: Nurse Practitioner

## 2013-03-27 NOTE — Progress Notes (Signed)
Subjective:  Presents for recheck on her diabetes. Patient kept forgetting her morning injection of NPH insulin, blood sugars went up. Started Lantus back about a month ago. Currently on 38 units in the evening. Sugar has run 112-165. Gets regular eye exams. No changes in her feet. Continues to experience numbness at times with occasional cramping. No change in her breathing. Shortness of breath and wheezing slightly improved. No chest pain or ischemic type pain. Staying active. Rare exercise. Doing very well with her diet. No regular dental care.  Objective:   BP 136/86  Ht 5\' 2"  (1.575 m)  Wt 174 lb 6.4 oz (79.107 kg)  BMI 31.89 kg/m2 NAD. Alert, oriented. Thyroid no masses or goiter noted; nontender. Lungs occasional faint expiratory wheeze, no tachypnea. Otherwise clear. Heart regular rate rhythm. Abdomen soft mildly obese nondistended nontender. Feet: No edema noted. Strong DP pulses. Toes warm with good capillary refill. Monofilament test decreased sensation in both great toes bilaterally otherwise normal.  Assessment:Type 2 diabetes mellitus with diabetic neuropathy  Asthma, chronic  GERD (gastroesophageal reflux disease)  Hyperlipidemia LDL goal < 100  Patient has paperwork to get her lab work done this morning. Continue Lantus as directed. Strongly encourage dental if patient can afford it. Recommend regular activity as tolerated. Reviewed diabetic footcare. Recheck in 3 months, call back sooner if any problems. Meds ordered this encounter  Medications  . DISCONTD: HYDROcodone-acetaminophen (NORCO) 10-325 MG per tablet    Sig: Take 1 tablet by mouth every 6 (six) hours as needed.    Dispense:  30 tablet    Refill:  0    Order Specific Question:  Supervising Provider    Answer:  Mikey Kirschner [[id number]]  . DISCONTD: HYDROcodone-acetaminophen (NORCO) 10-325 MG per tablet    Sig: Take 1 tablet by mouth every 6 (six) hours as needed.    Dispense:  30 tablet    Refill:  0    May  fill 30 days from 03/24/13    Order Specific Question:  Supervising Provider    Answer:  Mikey Kirschner [[id number]]  . HYDROcodone-acetaminophen (NORCO) 10-325 MG per tablet    Sig: Take 1 tablet by mouth every 6 (six) hours as needed.    Dispense:  30 tablet    Refill:  0    May fill 60 days from 03/24/13    Order Specific Question:  Supervising Provider    Answer:  Mikey Kirschner [[id number]]  . levothyroxine (SYNTHROID, LEVOTHROID) 50 MCG tablet    Sig: Take 1 tablet (50 mcg total) by mouth daily before breakfast.    Dispense:  30 tablet    Refill:  2    Order Specific Question:  Supervising Provider    Answer:  Mikey Kirschner [[id number]]  . LEADER INSULIN SYRINGE 31G X 5/16" 0.5 ML MISC    Sig:

## 2013-03-27 NOTE — Assessment & Plan Note (Signed)
Patient has paperwork to get her lab work done this morning. Continue Lantus as directed. Strongly encourage dental if patient can afford it. Recommend regular activity as tolerated. Reviewed diabetic footcare. Recheck in 3 months, call back sooner if any problems.

## 2013-03-27 NOTE — Assessment & Plan Note (Signed)
Labwork pending  

## 2013-03-27 NOTE — Assessment & Plan Note (Signed)
Stable  Continue current regimen  

## 2013-03-27 NOTE — Assessment & Plan Note (Signed)
Lab work pending. Continue levothyroxine.

## 2013-04-21 ENCOUNTER — Telehealth: Payer: Self-pay | Admitting: Family Medicine

## 2013-04-21 MED ORDER — INSULIN GLARGINE 100 UNIT/ML ~~LOC~~ SOLN: 38.0000 [IU] | Freq: Every day | SUBCUTANEOUS | Status: DC

## 2013-04-21 NOTE — Telephone Encounter (Signed)
Pt notified and med sent

## 2013-04-21 NOTE — Telephone Encounter (Signed)
Patient needs Rx for 2 bottles of lancets because she will run out before her 30 days.   Maria [name]

## 2013-04-25 ENCOUNTER — Other Ambulatory Visit: Payer: Self-pay | Admitting: *Deleted

## 2013-04-25 MED ORDER — PRAVASTATIN SODIUM 80 MG PO TABS: 80.0000 mg | ORAL_TABLET | Freq: Every day | ORAL | Status: DC

## 2013-04-28 ENCOUNTER — Other Ambulatory Visit: Payer: Self-pay | Admitting: *Deleted

## 2013-04-28 MED ORDER — GLIPIZIDE ER 10 MG PO TB24: 10.0000 mg | ORAL_TABLET | Freq: Every day | ORAL | Status: DC

## 2013-05-02 ENCOUNTER — Other Ambulatory Visit: Payer: Self-pay | Admitting: Family Medicine

## 2013-05-02 DIAGNOSIS — Z1231 Encounter for screening mammogram for malignant neoplasm of breast: Secondary | ICD-10-CM

## 2013-05-22 ENCOUNTER — Other Ambulatory Visit: Payer: Self-pay

## 2013-05-22 MED ORDER — FLUTICASONE-SALMETEROL 500-50 MCG/DOSE IN AEPB: 1.0000 | INHALATION_SPRAY | Freq: Two times a day (BID) | RESPIRATORY_TRACT | Status: DC

## 2013-06-05 ENCOUNTER — Inpatient Hospital Stay (HOSPITAL_COMMUNITY): Admission: RE | Admit: 2013-06-05 | Payer: Medicare Other | Source: Ambulatory Visit

## 2013-06-16 ENCOUNTER — Ambulatory Visit (HOSPITAL_COMMUNITY)
Admission: RE | Admit: 2013-06-16 | Discharge: 2013-06-16 | Disposition: A | Discharge status: Home / Self Care | Payer: Medicare Other | Source: Ambulatory Visit | Attending: Family Medicine | Admitting: Family Medicine

## 2013-06-16 DIAGNOSIS — Z1231 Encounter for screening mammogram for malignant neoplasm of breast: Secondary | ICD-10-CM | POA: Insufficient documentation

## 2013-06-23 ENCOUNTER — Other Ambulatory Visit: Payer: Self-pay | Admitting: Family Medicine

## 2013-06-23 ENCOUNTER — Telehealth: Payer: Self-pay | Admitting: Family Medicine

## 2013-06-23 ENCOUNTER — Other Ambulatory Visit: Payer: Self-pay | Admitting: Nurse Practitioner

## 2013-06-23 ENCOUNTER — Encounter: Payer: Medicare Other | Admitting: Nurse Practitioner

## 2013-06-23 DIAGNOSIS — E785 Hyperlipidemia, unspecified: Secondary | ICD-10-CM

## 2013-06-23 DIAGNOSIS — Z79899 Other long term (current) drug therapy: Secondary | ICD-10-CM

## 2013-06-23 DIAGNOSIS — E119 Type 2 diabetes mellitus without complications: Secondary | ICD-10-CM

## 2013-06-23 DIAGNOSIS — E039 Hypothyroidism, unspecified: Secondary | ICD-10-CM

## 2013-06-23 NOTE — Telephone Encounter (Signed)
Patient needs order for blood work. She has an appointment for Wednesday.

## 2013-06-23 NOTE — Telephone Encounter (Signed)
tsh lip liv A1C met7

## 2013-06-23 NOTE — Telephone Encounter (Signed)
03/24/13 TSH, microalb urine 12/12/12  TSH, lipid, liver, microalb urine, A1C

## 2013-06-23 NOTE — Telephone Encounter (Signed)
Ok plus 5 monthly ref 

## 2013-06-23 NOTE — Telephone Encounter (Signed)
bloodwork orders ready. Pt notified.  

## 2013-06-25 ENCOUNTER — Encounter: Payer: Self-pay | Admitting: Nurse Practitioner

## 2013-06-25 ENCOUNTER — Ambulatory Visit (INDEPENDENT_AMBULATORY_CARE_PROVIDER_SITE_OTHER): Payer: Medicare Other | Admitting: Nurse Practitioner

## 2013-06-25 VITALS — BP 144/74 | HR 84 | Ht 61.0 in | Wt 175.0 lb

## 2013-06-25 DIAGNOSIS — E1140 Type 2 diabetes mellitus with diabetic neuropathy, unspecified: Secondary | ICD-10-CM

## 2013-06-25 DIAGNOSIS — Z23 Encounter for immunization: Secondary | ICD-10-CM | POA: Diagnosis not present

## 2013-06-25 DIAGNOSIS — Z0000 Encounter for general adult medical examination without abnormal findings: Secondary | ICD-10-CM

## 2013-06-25 DIAGNOSIS — E1149 Type 2 diabetes mellitus with other diabetic neurological complication: Secondary | ICD-10-CM

## 2013-06-25 DIAGNOSIS — Z01419 Encounter for gynecological examination (general) (routine) without abnormal findings: Secondary | ICD-10-CM

## 2013-06-25 DIAGNOSIS — J322 Chronic ethmoidal sinusitis: Secondary | ICD-10-CM

## 2013-06-25 LAB — LIPID PANEL
CHOL/HDL RATIO: 4.8 ratio
Cholesterol: 186 mg/dL (ref 0–200)
HDL: 39 mg/dL — AB (ref >39)
LDL CALC: 118 mg/dL — AB (ref 0–99)
Triglycerides: 144 mg/dL (ref <150)
VLDL: 29 mg/dL (ref 0–40)

## 2013-06-25 LAB — HEPATIC FUNCTION PANEL
ALBUMIN: 3.8 g/dL (ref 3.5–5.2)
ALT: 16 U/L (ref 0–35)
AST: 18 U/L (ref 0–37)
Alkaline Phosphatase: 126 U/L — ABNORMAL HIGH (ref 39–117)
BILIRUBIN TOTAL: 0.7 mg/dL (ref 0.2–1.2)
Bilirubin, Direct: 0.1 mg/dL (ref 0.0–0.3)
Indirect Bilirubin: 0.6 mg/dL (ref 0.2–1.2)
Total Protein: 6.1 g/dL (ref 6.0–8.3)

## 2013-06-25 LAB — BASIC METABOLIC PANEL
BUN: 13 mg/dL (ref 6–23)
CHLORIDE: 108 meq/L (ref 96–112)
CO2: 26 mEq/L (ref 19–32)
CREATININE: 0.58 mg/dL (ref 0.50–1.10)
Calcium: 8.9 mg/dL (ref 8.4–10.5)
GLUCOSE: 112 mg/dL — AB (ref 70–99)
POTASSIUM: 3.9 meq/L (ref 3.5–5.3)
Sodium: 140 mEq/L (ref 135–145)

## 2013-06-25 LAB — TSH: TSH: 15.05 u[IU]/mL — AB (ref 0.350–4.500)

## 2013-06-25 LAB — HEMOGLOBIN A1C
Hgb A1c MFr Bld: 7.8 % — ABNORMAL HIGH (ref <5.7)
Mean Plasma Glucose: 177 mg/dL — ABNORMAL HIGH (ref <117)

## 2013-06-25 MED ORDER — HYDROCODONE-ACETAMINOPHEN 10-325 MG PO TABS: 1.0000 | ORAL_TABLET | Freq: Four times a day (QID) | ORAL | Status: DC | PRN

## 2013-06-25 MED ORDER — AMOXICILLIN-POT CLAVULANATE 875-125 MG PO TABS: 1.0000 | ORAL_TABLET | Freq: Two times a day (BID) | ORAL | Status: DC

## 2013-06-25 NOTE — Patient Instructions (Addendum)
OTC antihistamine Magnesium daily as directed    Diabetes and Foot Care Diabetes may cause you to have problems because of poor blood supply (circulation) to your feet and legs. This may cause the skin on your feet to become thinner, break easier, and heal more slowly. Your skin may become dry, and the skin may peel and crack. You may also have nerve damage in your legs and feet causing decreased feeling in them. You may not notice minor injuries to your feet that could lead to infections or more serious problems. Taking care of your feet is one of the most important things you can do for yourself.  HOME CARE INSTRUCTIONS  Wear shoes at all times, even in the house. Do not go barefoot. Bare feet are easily injured.  Check your feet daily for blisters, cuts, and redness. If you cannot see the bottom of your feet, use a mirror or ask someone for help.  Wash your feet with warm water (do not use hot water) and mild soap. Then pat your feet and the areas between your toes until they are completely dry. Do not soak your feet as this can dry your skin.  Apply a moisturizing lotion or petroleum jelly (that does not contain alcohol and is unscented) to the skin on your feet and to dry, brittle toenails. Do not apply lotion between your toes.  Trim your toenails straight across. Do not dig under them or around the cuticle. File the edges of your nails with an emery board or nail file.  Do not cut corns or calluses or try to remove them with medicine.  Wear clean socks or stockings every day. Make sure they are not too tight. Do not wear knee-high stockings since they may decrease blood flow to your legs.  Wear shoes that fit properly and have enough cushioning. To break in new shoes, wear them for just a few hours a day. This prevents you from injuring your feet. Always look in your shoes before you put them on to be sure there are no objects inside.  Do not cross your legs. This may decrease the  blood flow to your feet.  If you find a minor scrape, cut, or break in the skin on your feet, keep it and the skin around it clean and dry. These areas may be cleansed with mild soap and water. Do not cleanse the area with peroxide, alcohol, or iodine.  When you remove an adhesive bandage, be sure not to damage the skin around it.  If you have a wound, look at it several times a day to make sure it is healing.  Do not use heating pads or hot water bottles. They may burn your skin. If you have lost feeling in your feet or legs, you may not know it is happening until it is too late.  Make sure your health care provider performs a complete foot exam at least annually or more often if you have foot problems. Report any cuts, sores, or bruises to your health care provider immediately. SEEK MEDICAL CARE IF:   You have an injury that is not healing.  You have cuts or breaks in the skin.  You have an ingrown nail.  You notice redness on your legs or feet.  You feel burning or tingling in your legs or feet.  You have pain or cramps in your legs and feet.  Your legs or feet are numb.  Your feet always feel cold. SEEK IMMEDIATE  MEDICAL CARE IF:   There is increasing redness, swelling, or pain in or around a wound.  There is a red line that goes up your leg.  Pus is coming from a wound.  You develop a fever or as directed by your health care provider.  You notice a bad smell coming from an ulcer or wound. Document Released: 03/03/2000 Document Revised: 11/06/2012 Document Reviewed: 08/13/2012 [facility] Patient Information 2014 [employer].

## 2013-06-26 ENCOUNTER — Encounter: Payer: Self-pay | Admitting: Nurse Practitioner

## 2013-06-26 NOTE — Progress Notes (Signed)
Subjective:    Patient ID: Maria [name], female    DOB: 05-Oct-1955, 58 y.o.   MRN: [id number]  HPI presents for wellness exam. Regular eye exams. Wears a full denture plate on top, no regular dental exams. Denies any oral sores or lesions. Has had ethmoid sinus pressure for the past 2 weeks. Unable to cough up mucus. Using her inhaler about 2-3 times per day over the past few days. Tends to have trouble when the weather changes. Continues to use her Advair and other medications, see list. Blood sugars running 110, rarely to 15 depending on what she eats. Regular activity. Does fairly well with her diet.    Review of Systems  Constitutional: Negative for fever, activity change, appetite change and fatigue.  HENT: Positive for congestion, postnasal drip, rhinorrhea and sinus pressure. Negative for dental problem, ear pain, mouth sores and sore throat.   Respiratory: Positive for cough and wheezing. Negative for chest tightness and shortness of breath.   Cardiovascular: Negative for chest pain and leg swelling.  Gastrointestinal: Negative for nausea, vomiting, abdominal pain, diarrhea, constipation and blood in stool.  Genitourinary: Negative for dysuria, urgency, frequency, vaginal discharge, enuresis, difficulty urinating, genital sores, vaginal pain and pelvic pain.  Neurological: Positive for headaches.       Objective:   Physical Exam  Vitals reviewed. Constitutional: She is oriented to person, place, and time. She appears well-developed. No distress.  HENT:  Right Ear: External ear normal.  Left Ear: External ear normal.  Mouth/Throat: Oropharynx is clear and moist.  TMs clear effusion, no erythema. Pharynx mildly injected with PND noted.  Neck: Normal range of motion. Neck supple. No tracheal deviation present. No thyromegaly present.  Mild anterior cervical adenopathy  Cardiovascular: Normal rate, regular rhythm, normal heart sounds and intact distal pulses.  Exam reveals no  gallop.   No murmur heard. Pulmonary/Chest: Effort normal and breath sounds normal. No respiratory distress. She has no wheezes. She has no rales.  Abdominal: Soft. She exhibits no distension and no mass. There is no tenderness. There is no rebound and no guarding.  Genitourinary: Vagina normal. No vaginal discharge found.  External GU: no rashes or lesions; bimanual exam normal; no obvious masses but limited due to extreme abd girth.  Musculoskeletal: She exhibits no edema.  Lymphadenopathy:    She has cervical adenopathy.  Neurological: She is alert and oriented to person, place, and time.  See diabetic foot exam.  Skin: Skin is warm and dry. No rash noted.  Psychiatric: She has a normal mood and affect. Her behavior is normal.          Assessment & Plan:   Problem List Items Addressed This Visit     Endocrine   Type 2 diabetes mellitus with diabetic neuropathy    Other Visit Diagnoses   Well woman exam    -  Primary    Need for prophylactic vaccination with combined diphtheria-tetanus-pertussis (DTP) vaccine        Relevant Orders       Tdap vaccine greater than or equal to 7yo IM (Completed)    Routine general medical examination at a health care facility        Relevant Orders       POC Hemoccult Bld/Stl (3-Cd Home Screen)    Ethmoid sinusitis        Relevant Medications       amoxicillin-clavulanate (AUGMENTIN) tablet 875-125 mg        Meds ordered this  encounter  Medications  . DISCONTD: HYDROcodone-acetaminophen (NORCO) 10-325 MG per tablet    Sig: Take 1 tablet by mouth every 6 (six) hours as needed.    Dispense:  30 tablet    Refill:  0    May fill 60 days from 06/25/13    Order Specific Question:  Supervising Provider    Answer:  Mikey Kirschner [[id number]]  . DISCONTD: HYDROcodone-acetaminophen (NORCO) 10-325 MG per tablet    Sig: Take 1 tablet by mouth every 6 (six) hours as needed.    Dispense:  30 tablet    Refill:  0    May fill 30 days from 06/25/13     Order Specific Question:  Supervising Provider    Answer:  Mikey Kirschner [[id number]]  . HYDROcodone-acetaminophen (NORCO) 10-325 MG per tablet    Sig: Take 1 tablet by mouth every 6 (six) hours as needed.    Dispense:  30 tablet    Refill:  0    Order Specific Question:  Supervising Provider    Answer:  Mikey Kirschner [[id number]]  . amoxicillin-clavulanate (AUGMENTIN) 875-125 MG per tablet    Sig: Take 1 tablet by mouth 2 (two) times daily.    Dispense:  20 tablet    Refill:  0    Order Specific Question:  Supervising Provider    Answer:  Mikey Kirschner [[id number]]   continue current meds as directed. Encouraged healthy diabetic diet. Continue activity. Patient had lab work done this morning. Call back next week if no improvement in her breathing, sooner if worse. Recheck in 3 months, call back sooner if any problems.

## 2013-07-03 ENCOUNTER — Other Ambulatory Visit: Payer: Self-pay

## 2013-07-03 MED ORDER — LEVOTHYROXINE SODIUM 75 MCG PO TABS: 75.0000 ug | ORAL_TABLET | Freq: Every day | ORAL | Status: DC

## 2013-07-14 ENCOUNTER — Other Ambulatory Visit: Payer: Self-pay | Admitting: Family Medicine

## 2013-07-15 ENCOUNTER — Telehealth: Payer: Self-pay | Admitting: Family Medicine

## 2013-07-15 NOTE — Telephone Encounter (Signed)
Patient notified

## 2013-07-15 NOTE — Telephone Encounter (Signed)
Patient is calling to request a prescription for ear drops, because it is draining. She has used ofloxacin (FLOXIN) 0.3 % otic solution in the past.  [employer].

## 2013-07-15 NOTE — Telephone Encounter (Signed)
Ok, same, five drops bid affected ear

## 2013-07-22 ENCOUNTER — Other Ambulatory Visit: Payer: Self-pay | Admitting: Family Medicine

## 2013-07-22 ENCOUNTER — Other Ambulatory Visit: Payer: Self-pay | Admitting: *Deleted

## 2013-07-22 MED ORDER — ONDANSETRON 4 MG PO TBDP: 4.0000 mg | ORAL_TABLET | Freq: Four times a day (QID) | ORAL | Status: DC | PRN

## 2013-07-22 NOTE — Telephone Encounter (Signed)
Pt states Dr. Richardson Landry started this med several years ago. She states she does not take on a regular basis but sometimes has to take 2 - 3 per week. Pt requesting something different for nausea if not able to refill reglan.

## 2013-07-22 NOTE — Telephone Encounter (Signed)
Try instead new nausea med zofr odt 4 mg thirty one q 6hrs prn nausea

## 2013-07-22 NOTE — Telephone Encounter (Signed)
Who originally started this?? This med is falling out of favor for long term use.

## 2013-08-21 ENCOUNTER — Other Ambulatory Visit: Payer: Self-pay | Admitting: Family Medicine

## 2013-09-25 ENCOUNTER — Ambulatory Visit: Payer: Medicare Other | Admitting: Nurse Practitioner

## 2013-09-26 ENCOUNTER — Encounter: Payer: Self-pay | Admitting: Nurse Practitioner

## 2013-09-26 ENCOUNTER — Ambulatory Visit (INDEPENDENT_AMBULATORY_CARE_PROVIDER_SITE_OTHER): Payer: Medicare Other | Admitting: Nurse Practitioner

## 2013-09-26 VITALS — BP 132/90 | Ht 61.0 in | Wt 173.0 lb

## 2013-09-26 DIAGNOSIS — E1140 Type 2 diabetes mellitus with diabetic neuropathy, unspecified: Secondary | ICD-10-CM

## 2013-09-26 DIAGNOSIS — E1142 Type 2 diabetes mellitus with diabetic polyneuropathy: Secondary | ICD-10-CM

## 2013-09-26 DIAGNOSIS — G894 Chronic pain syndrome: Secondary | ICD-10-CM

## 2013-09-26 DIAGNOSIS — E1149 Type 2 diabetes mellitus with other diabetic neurological complication: Secondary | ICD-10-CM

## 2013-09-26 LAB — POCT GLYCOSYLATED HEMOGLOBIN (HGB A1C): Hemoglobin A1C: 6.8

## 2013-09-26 MED ORDER — HYDROCODONE-ACETAMINOPHEN 10-325 MG PO TABS: 1.0000 | ORAL_TABLET | Freq: Four times a day (QID) | ORAL | Status: DC | PRN

## 2013-09-26 NOTE — Patient Instructions (Signed)
lantus solostar pen

## 2013-09-26 NOTE — Progress Notes (Signed)
   Subjective:    Patient ID: Maria [name], female    DOB: 01/12/1956, 58 y.o.   MRN: [id number]  Diabetes She presents for her follow-up diabetic visit. She has type 2 diabetes mellitus. Hypoglycemia symptoms include nervousness/anxiousness and sweats. Associated symptoms include fatigue. Current diabetic treatment includes oral agent (dual therapy) and insulin injections. She is compliant with treatment most of the time. She is currently taking insulin at bedtime. She monitors blood glucose at home 1-2 x per day. Her overall blood glucose range is 130-140 mg/dl. She does not see a podiatrist.Eye exam is not current.   Pt states she runs out of her insulin before it is due for a refill.  Has increased her activity, walking program. Doing very well with her diet. No chest pain/ischemic type pain or unusual shortness of breath. Using pain medication only for severe pain, not using this every day.  Review of Systems  Constitutional: Positive for fatigue.  Psychiatric/Behavioral: The patient is nervous/anxious.        Objective:   Physical Exam NAD. Alert, oriented. Lungs clear. Heart regular rate rhythm. Results for orders placed in visit on 09/26/13  POCT GLYCOSYLATED HEMOGLOBIN (HGB A1C)      Result Value Ref Range   Hemoglobin A1C 6.8     Diabetic foot exam done at last visit. No changes.       Assessment & Plan:   Problem List Items Addressed This Visit     Endocrine   Type 2 diabetes mellitus with diabetic neuropathy - Primary   Relevant Orders      POCT glycosylated hemoglobin (Hb A1C) (Completed)    Other Visit Diagnoses   Chronic pain syndrome          Continue current regimen. Hemoglobin A1c much improved. Patient to look into switching to Lantus solo star pens to see if this will help save her some money and insulin well last more than a month. Meds ordered this encounter  Medications  . DISCONTD: HYDROcodone-acetaminophen (NORCO) 10-325 MG per tablet    Sig:  Take 1 tablet by mouth every 6 (six) hours as needed.    Dispense:  30 tablet    Refill:  0    Order Specific Question:  Supervising Provider    Answer:  Mikey Kirschner [[id number]]  . DISCONTD: HYDROcodone-acetaminophen (NORCO) 10-325 MG per tablet    Sig: Take 1 tablet by mouth every 6 (six) hours as needed.    Dispense:  30 tablet    Refill:  0    May fill 30 days from 09/26/13    Order Specific Question:  Supervising Provider    Answer:  Mikey Kirschner [[id number]]  . HYDROcodone-acetaminophen (NORCO) 10-325 MG per tablet    Sig: Take 1 tablet by mouth every 6 (six) hours as needed.    Dispense:  30 tablet    Refill:  0    May fill 60 days from 09/26/13    Order Specific Question:  Supervising Provider    Answer:  Mikey Kirschner [[id number]]   Recheck in 3-4 months, call back sooner if any problems.

## 2013-09-29 ENCOUNTER — Telehealth: Payer: Self-pay | Admitting: Nurse Practitioner

## 2013-09-29 NOTE — Telephone Encounter (Signed)
Patient said that her insurance does cover lantus solostar so she would like an Rx for this sent to [facility].

## 2013-09-29 NOTE — Telephone Encounter (Signed)
Last seen 09/26/13 for diabetes

## 2013-09-30 ENCOUNTER — Other Ambulatory Visit: Payer: Self-pay | Admitting: Nurse Practitioner

## 2013-09-30 ENCOUNTER — Encounter: Payer: Self-pay | Admitting: Nurse Practitioner

## 2013-09-30 MED ORDER — INSULIN GLARGINE 100 UNIT/ML SOLOSTAR PEN: PEN_INJECTOR | SUBCUTANEOUS | Status: DC

## 2013-09-30 MED ORDER — "PEN NEEDLES 5/16"" 31G X 8 MM MISC": Status: DC

## 2013-09-30 NOTE — Telephone Encounter (Signed)
Lantus solostar pen with box of needles sent in.

## 2013-09-30 NOTE — Telephone Encounter (Signed)
Pt.notified

## 2013-11-25 ENCOUNTER — Other Ambulatory Visit: Payer: Self-pay | Admitting: Family Medicine

## 2013-12-24 ENCOUNTER — Other Ambulatory Visit: Payer: Self-pay | Admitting: Family Medicine

## 2013-12-24 NOTE — Telephone Encounter (Signed)
Ok 6 mo for botjh

## 2014-01-02 ENCOUNTER — Telehealth: Payer: Self-pay | Admitting: Family Medicine

## 2014-01-02 DIAGNOSIS — E785 Hyperlipidemia, unspecified: Secondary | ICD-10-CM

## 2014-01-02 DIAGNOSIS — E119 Type 2 diabetes mellitus without complications: Secondary | ICD-10-CM

## 2014-01-02 DIAGNOSIS — Z79899 Other long term (current) drug therapy: Secondary | ICD-10-CM

## 2014-01-02 NOTE — Telephone Encounter (Signed)
Does patient need to have BW done before appointment with Hoyle Sauer on 01/07/14?

## 2014-01-05 NOTE — Telephone Encounter (Signed)
Blood work orders placed in Epic. Patient notified. 

## 2014-01-05 NOTE — Telephone Encounter (Signed)
Yes. Need lipid profile, liver profile and HgbA1C. Thanks.

## 2014-01-06 ENCOUNTER — Ambulatory Visit: Payer: Medicare Other | Admitting: Nurse Practitioner

## 2014-01-07 ENCOUNTER — Encounter: Payer: Self-pay | Admitting: Nurse Practitioner

## 2014-01-07 ENCOUNTER — Ambulatory Visit (INDEPENDENT_AMBULATORY_CARE_PROVIDER_SITE_OTHER): Payer: Medicare Other | Admitting: Nurse Practitioner

## 2014-01-07 VITALS — BP 130/80 | Ht 61.0 in | Wt 170.0 lb

## 2014-01-07 DIAGNOSIS — L819 Disorder of pigmentation, unspecified: Secondary | ICD-10-CM

## 2014-01-07 DIAGNOSIS — Z23 Encounter for immunization: Secondary | ICD-10-CM

## 2014-01-07 DIAGNOSIS — E1140 Type 2 diabetes mellitus with diabetic neuropathy, unspecified: Secondary | ICD-10-CM

## 2014-01-07 DIAGNOSIS — J4520 Mild intermittent asthma, uncomplicated: Secondary | ICD-10-CM

## 2014-01-07 DIAGNOSIS — J0120 Acute ethmoidal sinusitis, unspecified: Secondary | ICD-10-CM

## 2014-01-07 LAB — HEPATIC FUNCTION PANEL
ALK PHOS: 118 U/L — AB (ref 39–117)
ALT: 13 U/L (ref 0–35)
AST: 14 U/L (ref 0–37)
Albumin: 4 g/dL (ref 3.5–5.2)
BILIRUBIN INDIRECT: 0.6 mg/dL (ref 0.2–1.2)
Bilirubin, Direct: 0.2 mg/dL (ref 0.0–0.3)
Total Bilirubin: 0.8 mg/dL (ref 0.2–1.2)
Total Protein: 6.5 g/dL (ref 6.0–8.3)

## 2014-01-07 LAB — LIPID PANEL
Cholesterol: 169 mg/dL (ref 0–200)
HDL: 35 mg/dL — ABNORMAL LOW (ref >39)
LDL CALC: 115 mg/dL — AB (ref 0–99)
TRIGLYCERIDES: 97 mg/dL (ref <150)
Total CHOL/HDL Ratio: 4.8 Ratio
VLDL: 19 mg/dL (ref 0–40)

## 2014-01-07 LAB — HEMOGLOBIN A1C
HEMOGLOBIN A1C: 7.4 % — AB (ref <5.7)
Mean Plasma Glucose: 166 mg/dL — ABNORMAL HIGH (ref <117)

## 2014-01-07 MED ORDER — LEVOFLOXACIN 500 MG PO TABS: 500.0000 mg | ORAL_TABLET | Freq: Every day | ORAL | Status: DC

## 2014-01-07 MED ORDER — HYDROCODONE-ACETAMINOPHEN 10-325 MG PO TABS: 1.0000 | ORAL_TABLET | Freq: Four times a day (QID) | ORAL | Status: DC | PRN

## 2014-01-07 NOTE — Patient Instructions (Signed)
Magnesium as directed Nasacort AQ as directed

## 2014-01-12 ENCOUNTER — Encounter: Payer: Self-pay | Admitting: Nurse Practitioner

## 2014-01-12 NOTE — Progress Notes (Signed)
Subjective:  Presents for recheck on diabetes. FBS is good; BS running in 200's in the evenings. Doing well with here diet. Tries to stay active.  Had labs done this AM. Occasional numbness and tingling in the feet. Uses pain med nor more than one to two per day; never more than 30 per month. Occasional cramping in the lower legs especially at night. Has a lesion on the left side of the nose that has been there for several months. Non tender, no change. Her m. gmother had skin cancer, unsure which type. Also c/o cough and congestion for the past several days. Runny nose. Ethmoid sinus area headache. bilat ear pain. No sore throat. Using albuterol twice a day for the past 2 weeks.   Objective:   BP 130/80  Ht 5\' 1"  (1.549 m)  Wt 170 lb (77.111 kg)  BMI 32.14 kg/m2 NAD. Alert, oriented. TMs clear effusion. Pharynx injected with PND noted. Lungs clear, heart RRR. Small slightly raised mildly erythematous lesion left side of the nose. Several small broken vessels noted on face. See diabetic foot exam.   Assessment:  Problem List Items Addressed This Visit     Respiratory   Asthma, chronic - Primary     Endocrine   Type 2 diabetes mellitus with diabetic neuropathy   Relevant Medications      metFORMIN (GLUCOPHAGE) 500 MG tablet    Other Visit Diagnoses   Acute ethmoidal sinusitis, recurrence not specified        Relevant Medications       levofloxacin (LEVAQUIN) tablet    Encounter for immunization        Atypical pigmented skin lesion        Relevant Orders       Ambulatory referral to Dermatology      Plan:  Meds ordered this encounter  Medications  . metFORMIN (GLUCOPHAGE) 500 MG tablet    Sig: TAKE 2 TABLETS  A DAY WITH MEALS  . DISCONTD: HYDROcodone-acetaminophen (NORCO) 10-325 MG per tablet    Sig: Take 1 tablet by mouth every 6 (six) hours as needed.    Dispense:  30 tablet    Refill:  0    May fill 60 days from 01/07/14    Order Specific Question:  Supervising Provider   Answer:  Mikey Kirschner [[id number]]  . levofloxacin (LEVAQUIN) 500 MG tablet    Sig: Take 1 tablet (500 mg total) by mouth daily.    Dispense:  10 tablet    Refill:  0    Order Specific Question:  Supervising Provider    Answer:  Mikey Kirschner [[id number]]  . DISCONTD: HYDROcodone-acetaminophen (NORCO) 10-325 MG per tablet    Sig: Take 1 tablet by mouth every 6 (six) hours as needed.    Dispense:  30 tablet    Refill:  0    May fill 30 days from 01/07/14    Order Specific Question:  Supervising Provider    Answer:  Mikey Kirschner [[id number]]  . HYDROcodone-acetaminophen (NORCO) 10-325 MG per tablet    Sig: Take 1 tablet by mouth every 6 (six) hours as needed.    Dispense:  30 tablet    Refill:  0    Order Specific Question:  Supervising Provider    Answer:  Mikey Kirschner [[id number]]   Magnesium for leg cramps. nasacort AQ as directed.  Return in about 3 months (around 04/09/2014).

## 2014-01-20 ENCOUNTER — Encounter: Payer: Self-pay | Admitting: Family Medicine

## 2014-01-26 ENCOUNTER — Other Ambulatory Visit: Payer: Self-pay | Admitting: Family Medicine

## 2014-01-31 ENCOUNTER — Other Ambulatory Visit: Payer: Self-pay | Admitting: *Deleted

## 2014-02-02 NOTE — Telephone Encounter (Signed)
Patient has been getting 30 a month since 09/2012

## 2014-02-02 NOTE — Telephone Encounter (Signed)
Was this just rx'ed at last chronic visit? How often does pt take? REfill monthly?

## 2014-02-02 NOTE — Telephone Encounter (Signed)
See my questions

## 2014-02-02 NOTE — Telephone Encounter (Signed)
Ok plus 2 ref 

## 2014-03-17 ENCOUNTER — Ambulatory Visit (INDEPENDENT_AMBULATORY_CARE_PROVIDER_SITE_OTHER): Payer: Medicare Other | Admitting: Family Medicine

## 2014-03-17 ENCOUNTER — Encounter: Payer: Self-pay | Admitting: Family Medicine

## 2014-03-17 VITALS — BP 128/80 | Temp 98.3°F | Ht 61.0 in | Wt 169.0 lb

## 2014-03-17 DIAGNOSIS — J329 Chronic sinusitis, unspecified: Secondary | ICD-10-CM

## 2014-03-17 MED ORDER — PREDNISONE 10 MG PO TABS: ORAL_TABLET | ORAL | Status: DC

## 2014-03-17 MED ORDER — AMOXICILLIN-POT CLAVULANATE 875-125 MG PO TABS: 1.0000 | ORAL_TABLET | Freq: Two times a day (BID) | ORAL | Status: DC

## 2014-03-17 MED ORDER — HYDROCODONE-HOMATROPINE 5-1.5 MG/5ML PO SYRP: 5.0000 mL | ORAL_SOLUTION | Freq: Every evening | ORAL | Status: DC | PRN

## 2014-03-17 NOTE — Progress Notes (Signed)
   Subjective:    Patient ID: Maria [name], female    DOB: 07-16-1955, 58 y.o.   MRN: [id number]  Cough This is a new problem. Episode onset: Saturday. The problem has been unchanged. The cough is productive of sputum. Associated symptoms include nasal congestion and shortness of breath. She has tried steroid inhaler (Advair) for the symptoms. The treatment provided mild relief. Her past medical history is significant for asthma.   strted sat, got to coughing, nose running some and drainage  Some settling into the chest feels somewhat tight  Using neb and inhaler and as needed  No sig fever  Some prod phlegm   Review of Systems  Respiratory: Positive for cough and shortness of breath.    no vomiting no diarrhea no rash     Objective:   Physical Exam  Alert mild malaise. H&T nasal congestion pharynx slight erythema lungs bilateral wheezes no tachypnea no crackles heart rare rhythm nasal discharge      Assessment & Plan:  Impression rhinosinusitis bronchitis with exacerbation of reactive airways plan albuterol 4 times a day. And prednisone taper. Augmentin twice a day 10 days. Warning signs discussed. WSL

## 2014-03-27 ENCOUNTER — Other Ambulatory Visit: Payer: Self-pay | Admitting: Family Medicine

## 2014-04-06 ENCOUNTER — Encounter: Payer: Self-pay | Admitting: Nurse Practitioner

## 2014-04-06 ENCOUNTER — Ambulatory Visit (INDEPENDENT_AMBULATORY_CARE_PROVIDER_SITE_OTHER): Payer: Medicare Other | Admitting: Nurse Practitioner

## 2014-04-06 VITALS — BP 156/90 | Ht 61.0 in | Wt 167.0 lb

## 2014-04-06 DIAGNOSIS — E1140 Type 2 diabetes mellitus with diabetic neuropathy, unspecified: Secondary | ICD-10-CM

## 2014-04-06 DIAGNOSIS — E785 Hyperlipidemia, unspecified: Secondary | ICD-10-CM

## 2014-04-06 MED ORDER — HYDROCODONE-ACETAMINOPHEN 10-325 MG PO TABS: 1.0000 | ORAL_TABLET | Freq: Four times a day (QID) | ORAL | Status: DC | PRN

## 2014-04-06 NOTE — Patient Instructions (Signed)
Magnesium supplement 

## 2014-04-06 NOTE — Progress Notes (Signed)
Subjective:  Presents for routine follow-up. Her blood sugars went extremely high recently due to illness and prednisone. Since then her sugars have gone back to normal. Fasting blood sugar at home less than 100. In the afternoon around lunch 120-145. Takes 0-2 hydrocodone per day depending on pain especially in her feet. Active lifestyle. Has been taking care of her 46-month-old granddaughter. No chest pain/ischemic type pain or shortness of breath. No edema.  Objective:   BP 156/90 mmHg  Ht 5\' 1"  (1.549 m)  Wt 167 lb (75.751 kg)  BMI 31.57 kg/m2 NAD. Alert, oriented. Cheerful affect. Lungs rare faint expiratory wheeze noted only in the right lower lobe posterior and right upper lobe anterior. Otherwise clear. No tachypnea. Normal color. Heart regular rate rhythm. Lower extremities no edema.  Assessment:  Problem List Items Addressed This Visit      Endocrine   Type 2 diabetes mellitus with diabetic neuropathy - Primary     Other   Hyperlipidemia with target LDL less than 100 (Chronic)     Plan:  Meds ordered this encounter  Medications  . DISCONTD: HYDROcodone-acetaminophen (NORCO) 10-325 MG per tablet    Sig: Take 1 tablet by mouth every 6 (six) hours as needed.    Dispense:  30 tablet    Refill:  0    Order Specific Question:  Supervising Provider    Answer:  Mikey Kirschner [[id number]]  . DISCONTD: HYDROcodone-acetaminophen (NORCO) 10-325 MG per tablet    Sig: Take 1 tablet by mouth every 6 (six) hours as needed.    Dispense:  30 tablet    Refill:  0    May fill 30 days from 04/06/14    Order Specific Question:  Supervising Provider    Answer:  Mikey Kirschner [[id number]]  . HYDROcodone-acetaminophen (NORCO) 10-325 MG per tablet    Sig: Take 1 tablet by mouth every 6 (six) hours as needed.    Dispense:  30 tablet    Refill:  0    May fill 60 days from 04/06/14    Order Specific Question:  Supervising Provider    Answer:  Mikey Kirschner [[id number]]   Continue current medications  as directed. Reviewed labs done 01/07/2014 with patient. Return in about 3 months (around 06/29/2014) for physical. Plan repeat hemoglobin A1c and urine microalbumin at next visit. Call back sooner if any problems.

## 2014-04-08 ENCOUNTER — Telehealth: Payer: Self-pay | Admitting: Family Medicine

## 2014-04-08 ENCOUNTER — Encounter: Payer: Self-pay | Admitting: Nurse Practitioner

## 2014-04-08 NOTE — Telephone Encounter (Signed)
Pt called stating that she is needing the Dr. To write a letter  Stating that it is medically necessary for her to take alprazolam 0.5 mg For the insurance to pay for it.

## 2014-04-27 ENCOUNTER — Other Ambulatory Visit: Payer: Self-pay | Admitting: Nurse Practitioner

## 2014-05-01 ENCOUNTER — Telehealth: Payer: Self-pay | Admitting: Family Medicine

## 2014-05-01 NOTE — Telephone Encounter (Signed)
Rx non-formulary exception APPROVED for pt's ALPRAZolam Maria [name]) 0.5 MG tablet, expires 04/25/15 through her [facility] plan

## 2014-05-22 ENCOUNTER — Encounter: Payer: Self-pay | Admitting: Family Medicine

## 2014-05-22 ENCOUNTER — Ambulatory Visit (INDEPENDENT_AMBULATORY_CARE_PROVIDER_SITE_OTHER): Payer: Medicare Other | Admitting: Family Medicine

## 2014-05-22 VITALS — BP 130/70 | Temp 98.2°F | Ht 61.0 in | Wt 166.2 lb

## 2014-05-22 DIAGNOSIS — J4521 Mild intermittent asthma with (acute) exacerbation: Secondary | ICD-10-CM

## 2014-05-22 DIAGNOSIS — J329 Chronic sinusitis, unspecified: Secondary | ICD-10-CM

## 2014-05-22 DIAGNOSIS — J310 Chronic rhinitis: Secondary | ICD-10-CM

## 2014-05-22 MED ORDER — ALBUTEROL SULFATE HFA 108 (90 BASE) MCG/ACT IN AERS: 2.0000 | INHALATION_SPRAY | Freq: Four times a day (QID) | RESPIRATORY_TRACT | Status: DC | PRN

## 2014-05-22 MED ORDER — PREDNISONE 20 MG PO TABS: ORAL_TABLET | ORAL | Status: DC

## 2014-05-22 MED ORDER — LEVOFLOXACIN 500 MG PO TABS: 500.0000 mg | ORAL_TABLET | Freq: Every day | ORAL | Status: DC

## 2014-05-22 NOTE — Progress Notes (Signed)
   Subjective:    Patient ID: Maria [name], female    DOB: 09-21-1955, 59 y.o.   MRN: [id number]  Sinusitis This is a new problem. The current episode started in the past 7 days. The problem has been gradually worsening since onset. There has been no fever. The pain is moderate. Associated symptoms include congestion, coughing, headaches and sinus pressure. (Wheezing, shortness of breath) Past treatments include oral decongestants. The treatment provided no relief.   Patient states that she has no other concerns at this time.  Wed morn woke up sick  Got to cougjing and real raaw  achey and sore thru the day  Cough and cong   Felt warm   Some coming up  Some wheezing  Some sickness in thre family  On nyquil and decongestant  Review of Systems  HENT: Positive for congestion and sinus pressure.   Respiratory: Positive for cough.   Neurological: Positive for headaches.       Objective:   Physical Exam Alert moderate malaise HET moderate his congestion frontal tenderness pharynx normal lungs bilateral wheezes substantial no tachypnea no inspiratory crackles       Assessment & Plan:  Impression rhinosinusitis/bronchitis with exacerbation of reactive airways plan prednisone taper. Albuterol regularly. Add antibiotics rationale discussed WSL

## 2014-05-25 ENCOUNTER — Other Ambulatory Visit: Payer: Self-pay | Admitting: Family Medicine

## 2014-06-10 ENCOUNTER — Other Ambulatory Visit: Payer: Self-pay | Admitting: Family Medicine

## 2014-06-10 DIAGNOSIS — Z1231 Encounter for screening mammogram for malignant neoplasm of breast: Secondary | ICD-10-CM

## 2014-06-22 ENCOUNTER — Encounter (HOSPITAL_COMMUNITY): Payer: Self-pay | Admitting: Emergency Medicine

## 2014-06-22 ENCOUNTER — Emergency Department (HOSPITAL_COMMUNITY)
Admission: EM | Admit: 2014-06-22 | Discharge: 2014-06-22 | Disposition: A | Discharge status: Home / Self Care | Payer: Medicare Other | Attending: Emergency Medicine | Admitting: Emergency Medicine

## 2014-06-22 ENCOUNTER — Ambulatory Visit: Payer: Medicare Other | Admitting: Family Medicine

## 2014-06-22 DIAGNOSIS — H6501 Acute serous otitis media, right ear: Secondary | ICD-10-CM | POA: Insufficient documentation

## 2014-06-22 DIAGNOSIS — Z8541 Personal history of malignant neoplasm of cervix uteri: Secondary | ICD-10-CM | POA: Insufficient documentation

## 2014-06-22 DIAGNOSIS — Z87891 Personal history of nicotine dependence: Secondary | ICD-10-CM | POA: Diagnosis not present

## 2014-06-22 DIAGNOSIS — K219 Gastro-esophageal reflux disease without esophagitis: Secondary | ICD-10-CM | POA: Insufficient documentation

## 2014-06-22 DIAGNOSIS — Z9889 Other specified postprocedural states: Secondary | ICD-10-CM | POA: Diagnosis not present

## 2014-06-22 DIAGNOSIS — Z792 Long term (current) use of antibiotics: Secondary | ICD-10-CM | POA: Insufficient documentation

## 2014-06-22 DIAGNOSIS — Z7952 Long term (current) use of systemic steroids: Secondary | ICD-10-CM | POA: Insufficient documentation

## 2014-06-22 DIAGNOSIS — E119 Type 2 diabetes mellitus without complications: Secondary | ICD-10-CM | POA: Insufficient documentation

## 2014-06-22 DIAGNOSIS — H409 Unspecified glaucoma: Secondary | ICD-10-CM | POA: Diagnosis not present

## 2014-06-22 DIAGNOSIS — E039 Hypothyroidism, unspecified: Secondary | ICD-10-CM | POA: Insufficient documentation

## 2014-06-22 DIAGNOSIS — J029 Acute pharyngitis, unspecified: Secondary | ICD-10-CM | POA: Insufficient documentation

## 2014-06-22 DIAGNOSIS — M791 Myalgia: Secondary | ICD-10-CM | POA: Insufficient documentation

## 2014-06-22 DIAGNOSIS — J45909 Unspecified asthma, uncomplicated: Secondary | ICD-10-CM | POA: Insufficient documentation

## 2014-06-22 DIAGNOSIS — E785 Hyperlipidemia, unspecified: Secondary | ICD-10-CM | POA: Diagnosis not present

## 2014-06-22 DIAGNOSIS — Z79899 Other long term (current) drug therapy: Secondary | ICD-10-CM | POA: Insufficient documentation

## 2014-06-22 DIAGNOSIS — Z794 Long term (current) use of insulin: Secondary | ICD-10-CM | POA: Insufficient documentation

## 2014-06-22 LAB — RAPID STREP SCREEN (MED CTR MEBANE ONLY): STREPTOCOCCUS, GROUP A SCREEN (DIRECT): NEGATIVE

## 2014-06-22 MED ORDER — AMOXICILLIN-POT CLAVULANATE 875-125 MG PO TABS: 1.0000 | ORAL_TABLET | Freq: Two times a day (BID) | ORAL | Status: DC

## 2014-06-22 NOTE — Discharge Instructions (Signed)
Your strep screen is negative but your right ear is infected. We are treating you with antibiotics and you will need to follow up with your doctor next week to be sure the infection is improving. Return here for worsening symptoms.

## 2014-06-22 NOTE — ED Notes (Signed)
Patient complaining of sore throat and generalized body aches x 4 days.

## 2014-06-22 NOTE — ED Provider Notes (Signed)
CSN: [id number]     Arrival date & time 06/22/14  7939 History   First MD Initiated Contact with Patient 06/22/14 1013     Chief Complaint  Patient presents with  . Sore Throat  . Generalized Body Aches     (Consider location/radiation/quality/duration/timing/severity/associated sxs/prior Treatment) Patient is a 59 y.o. female presenting with pharyngitis. The history is provided by the patient.  Sore Throat This is a new problem. The current episode started in the past 7 days. The problem occurs constantly. The problem has been gradually worsening. Associated symptoms include congestion, myalgias and a sore throat. Pertinent negatives include no coughing, fever, nausea, urinary symptoms or vomiting. Treatments tried: OTC medicatons. The treatment provided mild relief.   Maria [name] is a 59 y.o. female who presents to the ED with sore throat and right ear pain. She has been around her grand child who had ear infection and congestion.   Past Medical History  Diagnosis Date  . Diabetes mellitus without complication   . Hypothyroidism   . Hyperlipidemia   . Asthma   . Neuropathy   . Glaucoma   . GERD (gastroesophageal reflux disease)   . Vitamin D deficiency   . CTS (carpal tunnel syndrome)   . Cancer     cervical  . Allergy    Past Surgical History  Procedure Laterality Date  . Abdominal hysterectomy    . Cardiac catheterization  2006    negative  . Colonoscopy  2009    negative  . Left shoulder surgery Left 10/2009  . Inner ear surgery Right 1981   Family History  Problem Relation Age of Onset  . Diabetes Mother   . Prostate cancer Father   . Heart disease Father   . Heart attack Other    History  Substance Use Topics  . Smoking status: Former Smoker -- 15 years    Types: Cigarettes    Quit date: 06/05/2001  . Smokeless tobacco: Not on file  . Alcohol Use: No   OB History    No data available     Review of Systems  Constitutional: Negative for fever.   HENT: Positive for congestion, ear pain and sore throat.   Respiratory: Negative for cough.   Gastrointestinal: Negative for nausea and vomiting.  Musculoskeletal: Positive for myalgias.   All other systems negative   Allergies  Review of patient's allergies indicates no known allergies.  Home Medications   Prior to Admission medications   Medication Sig Start Date End Date Taking? Authorizing Provider  ALPRAZolam Duanne Moron) 0.5 MG tablet TAKE 1 TABLET AT BEDTIME AS NEEDED FOR SLEEP 12/24/13  Yes Mikey Kirschner, MD  bimatoprost (LUMIGAN) 0.01 % SOLN Place 1 drop into both eyes at bedtime.   Yes Historical Provider, MD  Fluticasone-Salmeterol (ADVAIR) 500-50 MCG/DOSE AEPB Inhale 1 puff into the lungs every 12 (twelve) hours. 05/22/13  Yes Mikey Kirschner, MD  GLIPIZIDE XL 10 MG 24 hr tablet TAKE 2 TABLETS DAILY AS DIRECTED 12/25/13  Yes Mikey Kirschner, MD  LANTUS SOLOSTAR 100 UNIT/ML Solostar Pen INJECT 38 UNITS SQ EVERY EVENING FOR DIABETES 04/27/14  Yes Mikey Kirschner, MD  levothyroxine (SYNTHROID, LEVOTHROID) 75 MCG tablet TAKE 1 TABLET DAILY 01/27/14  Yes Mikey Kirschner, MD  metFORMIN (GLUCOPHAGE) 500 MG tablet TAKE 2 TABLETS  A DAY WITH MEALS 11/25/13  Yes Mikey Kirschner, MD  montelukast (SINGULAIR) 10 MG tablet TAKE 1 TABLET ONCE A DAY 05/25/14  Yes Mikey Kirschner,  MD  omeprazole (PRILOSEC) 40 MG capsule TAKE 1 CAPSULE DAILY. 12/24/13  Yes Mikey Kirschner, MD  pravastatin (PRAVACHOL) 80 MG tablet TAKE 1 TABLET DAILY 01/27/14  Yes Mikey Kirschner, MD  albuterol (PROVENTIL HFA;VENTOLIN HFA) 108 (90 BASE) MCG/ACT inhaler Inhale 2 puffs into the lungs every 6 (six) hours as needed for wheezing or shortness of breath. 05/22/14   Mikey Kirschner, MD  amoxicillin-clavulanate (AUGMENTIN) 875-125 MG per tablet Take 1 tablet by mouth 2 (two) times daily. 06/22/14   Hope Bunnie Pion, NP  HYDROcodone-acetaminophen (NORCO) 10-325 MG per tablet Take 1 tablet by mouth every 6 (six) hours as  needed. Patient taking differently: Take 1 tablet by mouth every 6 (six) hours as needed (pain).  04/06/14   Nilda Simmer, NP  Insulin Pen Needle (PEN NEEDLES 31GX5/16") 31G X 8 MM MISC Use daily with Lantus pen 09/30/13   Nilda Simmer, NP  ketoconazole (NIZORAL) 2 % cream Apply topically 2 (two) times daily. Patient taking differently: Apply 1 application topically 2 (two) times daily as needed for irritation.  11/05/12   Kathyrn Drown, MD  LEADER INSULIN SYRINGE 31G X 5/16" 0.5 ML MISC  01/24/13   Historical Provider, MD  levofloxacin (LEVAQUIN) 500 MG tablet Take 1 tablet (500 mg total) by mouth daily. For 10 days Patient not taking: Reported on 06/22/2014 05/22/14   Mikey Kirschner, MD  ondansetron (ZOFRAN ODT) 4 MG disintegrating tablet Take 1 tablet (4 mg total) by mouth every 6 (six) hours as needed for nausea or vomiting. 07/22/13   Mikey Kirschner, MD  predniSONE (DELTASONE) 20 MG tablet Take 3 tabs qd x 3 days, 2 tabs qd x 2 days then 1 tab qd x 2 days Patient not taking: Reported on 06/22/2014 05/22/14   Mikey Kirschner, MD  triamcinolone cream (KENALOG) 0.1 % APPLY TO AFFECTED AREAS 2 TIMES A DAY AS NEEDED Patient not taking: Reported on 06/22/2014 08/21/13   Mikey Kirschner, MD   BP 119/75 mmHg  Pulse 94  Temp(Src) 97.9 F (36.6 C) (Oral)  Resp 18  Ht 5\' 2"  (1.575 m)  Wt 167 lb (75.751 kg)  BMI 30.54 kg/m2  SpO2 97% Physical Exam  Constitutional: She is oriented to person, place, and time. She appears well-developed and well-nourished.  HENT:  Head: Normocephalic.  Right Ear: Tympanic membrane is scarred, erythematous and retracted.  Left Ear: Tympanic membrane normal.  Mouth/Throat: Uvula is midline and mucous membranes are normal. Posterior oropharyngeal erythema present.  Eyes: Conjunctivae and EOM are normal.  Neck: Normal range of motion. Neck supple.  Cardiovascular: Normal rate and regular rhythm.   Pulmonary/Chest: Effort normal. No respiratory distress. She has  wheezes (right lower ).  Abdominal: Soft. There is no tenderness.  Musculoskeletal: Normal range of motion.  Lymphadenopathy:    She has no cervical adenopathy.  Neurological: She is alert and oriented to person, place, and time. No cranial nerve deficit.  Skin: Skin is warm and dry.  Psychiatric: She has a normal mood and affect. Her behavior is normal.  Nursing note and vitals reviewed.   ED Course  Procedures (including critical care time) Labs Review Results for orders placed or performed during the hospital encounter of 06/22/14 (from the past 24 hour(s))  Rapid strep screen     Status: None   Collection Time: 06/22/14 10:20 AM  Result Value Ref Range   Streptococcus, Group A Screen (Direct) NEGATIVE NEGATIVE     MDM  59 y.o. female former smoker with hx of asthma and complaint of sore throat, ear ache and body aches x 4 days. Stable for d/c without fever and does not appear toxic. Will treat for otitis media and bronchitis. Patient to follow up with her PCP to have the ear rechecked. She will return here as needed for problems. Discussed with the patient and all questioned fully answered. Augmentin for otitis and patient to continue her home medications.   Final diagnoses:  Right acute serous otitis media, recurrence not specified  Pharyngitis        Ashley Murrain, NP 06/22/14 1719  Lacretia Leigh, MD 06/26/14 1540

## 2014-06-22 NOTE — ED Notes (Signed)
Maria [name] with lab and strep test still " in progess"

## 2014-06-24 LAB — CULTURE, GROUP A STREP: STREP A CULTURE: NEGATIVE

## 2014-06-26 ENCOUNTER — Other Ambulatory Visit: Payer: Self-pay | Admitting: Family Medicine

## 2014-06-26 NOTE — Telephone Encounter (Signed)
It is okay to refill each with 2 additional refills recommend follow-up office visit mid summer

## 2014-06-29 ENCOUNTER — Ambulatory Visit (INDEPENDENT_AMBULATORY_CARE_PROVIDER_SITE_OTHER): Payer: Medicare Other | Admitting: Nurse Practitioner

## 2014-06-29 ENCOUNTER — Encounter: Payer: Self-pay | Admitting: Nurse Practitioner

## 2014-06-29 VITALS — BP 152/92 | Ht 62.0 in | Wt 162.0 lb

## 2014-06-29 DIAGNOSIS — Z0000 Encounter for general adult medical examination without abnormal findings: Secondary | ICD-10-CM

## 2014-06-29 DIAGNOSIS — Z01419 Encounter for gynecological examination (general) (routine) without abnormal findings: Secondary | ICD-10-CM

## 2014-06-29 DIAGNOSIS — E039 Hypothyroidism, unspecified: Secondary | ICD-10-CM

## 2014-06-29 DIAGNOSIS — E1140 Type 2 diabetes mellitus with diabetic neuropathy, unspecified: Secondary | ICD-10-CM

## 2014-06-29 DIAGNOSIS — E559 Vitamin D deficiency, unspecified: Secondary | ICD-10-CM | POA: Diagnosis not present

## 2014-06-29 DIAGNOSIS — E785 Hyperlipidemia, unspecified: Secondary | ICD-10-CM | POA: Diagnosis not present

## 2014-06-29 DIAGNOSIS — Z79899 Other long term (current) drug therapy: Secondary | ICD-10-CM

## 2014-06-29 MED ORDER — HYDROCODONE-ACETAMINOPHEN 10-325 MG PO TABS: 1.0000 | ORAL_TABLET | Freq: Four times a day (QID) | ORAL | Status: DC | PRN

## 2014-06-29 NOTE — Patient Instructions (Signed)
Nasacort AQ or Rhinocort nasal spray

## 2014-06-29 NOTE — Progress Notes (Signed)
Subjective:    Patient ID: Maria [name], female    DOB: Nov 16, 1955, 59 y.o.   MRN: [id number]  HPI AWV- Annual Wellness Visit  The patient was seen for their annual wellness visit. The patient's past medical history, surgical history, and family history were reviewed. Pertinent vaccines were reviewed ( tetanus, pneumonia, shingles, flu) The patient's medication list was reviewed and updated.  The height and weight were entered. The patient's current BMI is:29   Cognitive screening was completed. Outcome of Mini - Cog: Pass  Falls within the past 6 months:No  Current tobacco usage: No (All patients who use tobacco were given written and verbal information on quitting)  Recent listing of emergency department/hospitalizations over the past year were reviewed.  current specialist the patient sees on a regular basis: No   Medicare annual wellness visit patient questionnaire was reviewed.  A written screening schedule for the patient for the next 5-10 years was given. Appropriate discussion of followup regarding next visit was discussed.  Presents for her wellness exam. Married, same sexual partner. Last dental exam about 7 months ago. Wears full plate upper. Yearly eye exams. Placed on Augmentin 4/4 by ED for Rt OM and pharyngitis. Doing much better. Still trying to get energy back. FBS up some but less than 200. Healthy diet. Staying active with walking and house work. Pain medication helps patient function and perform ADLs. Limits to 30 per month. Only uses for extreme pain.       Review of Systems  Constitutional: Positive for fatigue. Negative for fever, activity change and appetite change.  HENT: Positive for congestion and postnasal drip. Negative for dental problem, ear pain, mouth sores, sinus pressure and sore throat.        Slight drainage. Minimal cough at night. No ear pain.  Respiratory: Negative for cough, chest tightness, shortness of breath and wheezing.     Cardiovascular: Negative for chest pain.  Gastrointestinal: Positive for constipation. Negative for nausea, vomiting, abdominal pain, diarrhea, blood in stool and abdominal distention.  Genitourinary: Negative for dysuria, urgency, frequency, hematuria, vaginal discharge, enuresis, difficulty urinating, genital sores and pelvic pain.  Neurological: Negative for headaches.       Objective:   Physical Exam  Constitutional: She is oriented to person, place, and time. She appears well-developed. No distress.  HENT:  Right Ear: External ear normal.  Left Ear: External ear normal.  Mouth/Throat: No oropharyngeal exudate.  TMs clear effusion, no erythema. More on the right. Pharynx mild erythema.   Neck: Normal range of motion. Neck supple. No tracheal deviation present. No thyromegaly present.  Very mild anterior adenopathy.  Cardiovascular: Normal rate, regular rhythm and normal heart sounds.  Exam reveals no gallop.   No murmur heard. Pulmonary/Chest: Effort normal and breath sounds normal. She has no wheezes. She has no rales.  Abdominal: Soft. She exhibits no distension. There is no tenderness.  Genitourinary: Vagina normal. No vaginal discharge found.  External GU: no rashes or lesions. Vagina pale and injected. No discharge. Bimanual exam: no tenderness or obvious masses; very limited due to abd girth. Rectal exam: no masses; small external hemorrhoid.  Musculoskeletal: She exhibits no edema.  Lymphadenopathy:    She has cervical adenopathy.  Neurological: She is alert and oriented to person, place, and time.  Skin: Skin is warm and dry. No rash noted.  Psychiatric: She has a normal mood and affect. Her behavior is normal.  Vitals reviewed. Breast exam: no masses; axillae no adenopathy.  Assessment & Plan:   Problem List Items Addressed This Visit      Endocrine   Type 2 diabetes mellitus with diabetic neuropathy   Relevant Orders   Microalbumin, urine   Hemoglobin  A1c   Hypothyroidism   Relevant Orders   TSH     Other   Hyperlipidemia with target LDL less than 100 (Chronic)   Relevant Orders   Lipid panel   Vitamin D deficiency   Relevant Orders   Vit D  25 hydroxy (rtn osteoporosis monitoring)    Other Visit Diagnoses    Well woman exam    -  Primary    Relevant Orders    POC Hemoccult Bld/Stl (3-Cd Home Screen)    Basic metabolic panel    High risk medication use        Relevant Orders    Hepatic function panel      Meds ordered this encounter  Medications  . DISCONTD: HYDROcodone-acetaminophen (NORCO) 10-325 MG per tablet    Sig: Take 1 tablet by mouth every 6 (six) hours as needed.    Dispense:  30 tablet    Refill:  0    May fill 60 days from 06/29/14    Order Specific Question:  Supervising Provider    Answer:  Mikey Kirschner [[id number]]  . DISCONTD: HYDROcodone-acetaminophen (NORCO) 10-325 MG per tablet    Sig: Take 1 tablet by mouth every 6 (six) hours as needed.    Dispense:  30 tablet    Refill:  0    May fill 30 days from 06/29/14    Order Specific Question:  Supervising Provider    Answer:  Mikey Kirschner [[id number]]  . HYDROcodone-acetaminophen (NORCO) 10-325 MG per tablet    Sig: Take 1 tablet by mouth every 6 (six) hours as needed.    Dispense:  30 tablet    Refill:  0    Order Specific Question:  Supervising Provider    Answer:  Mikey Kirschner [[id number]]   Recommend continued weight loss efforts. Stopped Flonase due to smell and taste. Switch to OTC spray as recommended. Complete Augmentin as directed and call back if persists. Daily vitamin D and calcium.  Return in about 3 months (around 09/28/2014) for recheck.

## 2014-07-27 ENCOUNTER — Other Ambulatory Visit: Payer: Self-pay | Admitting: Nurse Practitioner

## 2014-07-27 ENCOUNTER — Other Ambulatory Visit: Payer: Self-pay | Admitting: Family Medicine

## 2014-07-27 LAB — HM DIABETES EYE EXAM

## 2014-07-29 ENCOUNTER — Ambulatory Visit (HOSPITAL_COMMUNITY): Payer: Self-pay

## 2014-07-30 LAB — HEMOGLOBIN A1C
Est. average glucose Bld gHb Est-mCnc: 177 mg/dL
HEMOGLOBIN A1C: 7.8 % — AB (ref 4.8–5.6)

## 2014-07-30 LAB — HEPATIC FUNCTION PANEL
ALBUMIN: 4.1 g/dL (ref 3.5–5.5)
ALT: 18 IU/L (ref 0–32)
AST: 16 IU/L (ref 0–40)
Alkaline Phosphatase: 117 IU/L (ref 39–117)
Bilirubin Total: 0.6 mg/dL (ref 0.0–1.2)
Bilirubin, Direct: 0.17 mg/dL (ref 0.00–0.40)
Total Protein: 6.8 g/dL (ref 6.0–8.5)

## 2014-07-30 LAB — LIPID PANEL
Chol/HDL Ratio: 3.8 ratio units (ref 0.0–4.4)
Cholesterol, Total: 164 mg/dL (ref 100–199)
HDL: 43 mg/dL (ref >39)
LDL Calculated: 96 mg/dL (ref 0–99)
Triglycerides: 125 mg/dL (ref 0–149)
VLDL Cholesterol Cal: 25 mg/dL (ref 5–40)

## 2014-07-30 LAB — BASIC METABOLIC PANEL
BUN / CREAT RATIO: 18 (ref 9–23)
BUN: 12 mg/dL (ref 6–24)
CO2: 23 mmol/L (ref 18–29)
Calcium: 9.1 mg/dL (ref 8.7–10.2)
Chloride: 101 mmol/L (ref 97–108)
Creatinine, Ser: 0.66 mg/dL (ref 0.57–1.00)
GFR calc non Af Amer: 98 mL/min/{1.73_m2} (ref >59)
GFR, EST AFRICAN AMERICAN: 113 mL/min/{1.73_m2} (ref >59)
Glucose: 194 mg/dL — ABNORMAL HIGH (ref 65–99)
Potassium: 4.4 mmol/L (ref 3.5–5.2)
Sodium: 140 mmol/L (ref 134–144)

## 2014-07-30 LAB — MICROALBUMIN, URINE: Microalbumin, Urine: <3 ug/mL

## 2014-07-30 LAB — VITAMIN D 25 HYDROXY (VIT D DEFICIENCY, FRACTURES): Vit D, 25-Hydroxy: 9.2 ng/mL — ABNORMAL LOW (ref 30.0–100.0)

## 2014-07-30 LAB — TSH: TSH: 0.897 u[IU]/mL (ref 0.450–4.500)

## 2014-08-06 ENCOUNTER — Ambulatory Visit (HOSPITAL_COMMUNITY)
Admission: RE | Admit: 2014-08-06 | Discharge: 2014-08-06 | Disposition: A | Discharge status: Home / Self Care | Payer: Medicare Other | Source: Ambulatory Visit | Attending: Family Medicine | Admitting: Family Medicine

## 2014-08-06 DIAGNOSIS — Z1231 Encounter for screening mammogram for malignant neoplasm of breast: Secondary | ICD-10-CM | POA: Diagnosis not present

## 2014-09-25 ENCOUNTER — Other Ambulatory Visit: Payer: Self-pay | Admitting: Family Medicine

## 2014-09-25 NOTE — Telephone Encounter (Signed)
May have 3 refills on both medicines

## 2014-09-30 ENCOUNTER — Ambulatory Visit: Payer: Medicare Other | Admitting: Nurse Practitioner

## 2014-10-01 ENCOUNTER — Ambulatory Visit (INDEPENDENT_AMBULATORY_CARE_PROVIDER_SITE_OTHER): Payer: Medicare Other | Admitting: Nurse Practitioner

## 2014-10-01 ENCOUNTER — Encounter: Payer: Self-pay | Admitting: Nurse Practitioner

## 2014-10-01 VITALS — BP 132/80 | Ht 61.0 in | Wt 159.2 lb

## 2014-10-01 DIAGNOSIS — Z79891 Long term (current) use of opiate analgesic: Secondary | ICD-10-CM

## 2014-10-01 DIAGNOSIS — E1140 Type 2 diabetes mellitus with diabetic neuropathy, unspecified: Secondary | ICD-10-CM | POA: Diagnosis not present

## 2014-10-01 MED ORDER — HYDROCODONE-ACETAMINOPHEN 10-325 MG PO TABS: 1.0000 | ORAL_TABLET | Freq: Four times a day (QID) | ORAL | Status: DC | PRN

## 2014-10-02 ENCOUNTER — Encounter: Payer: Self-pay | Admitting: Nurse Practitioner

## 2014-10-02 DIAGNOSIS — Z79891 Long term (current) use of opiate analgesic: Secondary | ICD-10-CM | POA: Insufficient documentation

## 2014-10-02 NOTE — Progress Notes (Signed)
Subjective:  Presents for routine follow-up. FBS in a.m. running 99-130. Diet low in sugar. Compliant with medications. Walking on a regular basis. Taking pain medication 0-2 pills per day depending on level of pain from diabetic neuropathy. Takes pain medicine 4-5 days out of the week.  Objective:   BP 132/80 mmHg  Ht 5\' 1"  (1.549 m)  Wt 159 lb 4 oz (72.235 kg)  BMI 30.11 kg/m2 NAD. Alert, oriented. Lungs clear. Heart regular rate rhythm. Lower extremities no edema.  Assessment:  Problem List Items Addressed This Visit      Endocrine   Type 2 diabetes mellitus with diabetic neuropathy - Primary (Chronic)     Other   Encounter for long-term opiate analgesic use       Plan:  Meds ordered this encounter  Medications  . DISCONTD: HYDROcodone-acetaminophen (NORCO) 10-325 MG per tablet    Sig: Take 1 tablet by mouth every 6 (six) hours as needed.    Dispense:  30 tablet    Refill:  0    Order Specific Question:  Supervising Provider    Answer:  Mikey Kirschner [[id number]]  . DISCONTD: HYDROcodone-acetaminophen (NORCO) 10-325 MG per tablet    Sig: Take 1 tablet by mouth every 6 (six) hours as needed.    Dispense:  30 tablet    Refill:  0    May fill 30 days from 10/01/14    Order Specific Question:  Supervising Provider    Answer:  Mikey Kirschner [[id number]]  . HYDROcodone-acetaminophen (NORCO) 10-325 MG per tablet    Sig: Take 1 tablet by mouth every 6 (six) hours as needed.    Dispense:  30 tablet    Refill:  0    May fill 60 days from 10/01/14    Order Specific Question:  Supervising Provider    Answer:  Mikey Kirschner [[id number]]  . latanoprost (XALATAN) 0.005 % ophthalmic solution    Sig:     Refill:  5   Control substances agreement and opioid risk tool completed. Return in about 3 months (around 01/01/2015) for recheck.

## 2014-11-26 ENCOUNTER — Other Ambulatory Visit: Payer: Self-pay | Admitting: Family Medicine

## 2014-12-30 ENCOUNTER — Other Ambulatory Visit: Payer: Self-pay | Admitting: Nurse Practitioner

## 2015-01-04 ENCOUNTER — Encounter: Payer: Self-pay | Admitting: Nurse Practitioner

## 2015-01-04 ENCOUNTER — Ambulatory Visit (INDEPENDENT_AMBULATORY_CARE_PROVIDER_SITE_OTHER): Payer: Medicare Other | Admitting: Nurse Practitioner

## 2015-01-04 VITALS — BP 128/78 | Temp 98.1°F | Wt 160.0 lb

## 2015-01-04 DIAGNOSIS — K219 Gastro-esophageal reflux disease without esophagitis: Secondary | ICD-10-CM | POA: Diagnosis not present

## 2015-01-04 DIAGNOSIS — Z794 Long term (current) use of insulin: Secondary | ICD-10-CM | POA: Diagnosis not present

## 2015-01-04 DIAGNOSIS — E1140 Type 2 diabetes mellitus with diabetic neuropathy, unspecified: Secondary | ICD-10-CM | POA: Diagnosis not present

## 2015-01-04 DIAGNOSIS — Z23 Encounter for immunization: Secondary | ICD-10-CM

## 2015-01-04 DIAGNOSIS — J0110 Acute frontal sinusitis, unspecified: Secondary | ICD-10-CM | POA: Diagnosis not present

## 2015-01-04 DIAGNOSIS — E785 Hyperlipidemia, unspecified: Secondary | ICD-10-CM

## 2015-01-04 LAB — POCT GLYCOSYLATED HEMOGLOBIN (HGB A1C): Hemoglobin A1C: 7.3

## 2015-01-04 MED ORDER — AMOXICILLIN-POT CLAVULANATE 875-125 MG PO TABS: 1.0000 | ORAL_TABLET | Freq: Two times a day (BID) | ORAL | Status: DC

## 2015-01-04 NOTE — Progress Notes (Signed)
Subjective:  Presents for routine follow up. No CP/ischemic type pain or SOB. Active. Reflux acting up x 2 weeks. Taking daily Omeprazole. No abdominal pain or N/V. No increase in caffeine. Nonsmoker. No alcohol use. Slight increase in stress. Has been using OTC meds such as alka seltzer for breakthrough symptoms. Also sinus pressure and frontal area headache for over a week. No fever. Uses occasional Nasacort. No ear pain. Scratchy throat. Slight cough at night. Wheezing much improved, using less albuterol now. PND. Producing yellow mucus at times. Occasional numbness in the feet.   Objective:   BP 128/78 mmHg  Temp(Src) 98.1 F (36.7 C)  Wt 160 lb (72.576 kg) NAD. Alert, oriented. TMs significant effusion right, minimal left. No erythema. Pharynx injected with slight green PND. Neck supple with mild anterior adenopathy. Lungs: breath sounds slightly diminished but clear. Heart RRR. Abdomen obese, soft, nondistended, nontender.  Diabetic Foot Exam - Simple   Simple Foot Form  Diabetic Foot exam was performed with the following findings:  Yes 01/04/2015 10:03 AM  Visual Inspection  No deformities, no ulcerations, no other skin breakdown bilaterally:  Yes  Sensation Testing  See comments:  Yes  Pulse Check  See comments:  Yes  Comments  DP pulses present bilat. Confluent venous stasis color changes on top of both feet. Slight decrease in sensation on both great toes with monofilament test. Reviewed diabetic foot care.     Results for orders placed or performed in visit on 01/04/15  POCT HgB A1C  Result Value Ref Range   Hemoglobin A1C 7.3       Assessment:  Problem List Items Addressed This Visit      Digestive   GERD (gastroesophageal reflux disease)     Endocrine   Type 2 diabetes mellitus with diabetic neuropathy (HCC) - Primary (Chronic)   Relevant Orders   POCT HgB A1C (Completed)     Other   Hyperlipidemia with target LDL less than 100 (Chronic)   Relevant Orders   Lipid panel   Hepatic function panel    Other Visit Diagnoses    Acute non-recurrent frontal sinusitis        Encounter for immunization          Plan:  Meds ordered this encounter  Medications  . amoxicillin-clavulanate (AUGMENTIN) 875-125 MG tablet    Sig: Take 1 tablet by mouth 2 (two) times daily.    Dispense:  20 tablet    Refill:  0    Order Specific Question:  Supervising Provider    Answer:  Mikey Kirschner [[id number]]              Start daily Nasacort and antihistamine. Continue to work on weight particularly around abdomen. Repeat lipid and liver profiles in mid November.  Flu vaccine today. Sugar free TUMS for breakthrough reflux symptoms. Call back in 2 weeks if no better. Reviewed dietary measures for reflux. Return in about 3 months (around 04/06/2015) for diabetes check up.

## 2015-01-29 ENCOUNTER — Other Ambulatory Visit: Payer: Self-pay | Admitting: *Deleted

## 2015-01-29 MED ORDER — ALPRAZOLAM 0.5 MG PO TABS: 0.5000 mg | ORAL_TABLET | Freq: Every evening | ORAL | Status: DC | PRN

## 2015-01-29 MED ORDER — GLIPIZIDE ER 10 MG PO TB24: ORAL_TABLET | ORAL | Status: DC

## 2015-01-29 MED ORDER — OMEPRAZOLE 40 MG PO CPDR: 40.0000 mg | DELAYED_RELEASE_CAPSULE | Freq: Every day | ORAL | Status: DC

## 2015-01-29 MED ORDER — TRIAMCINOLONE ACETONIDE 0.1 % EX CREA: TOPICAL_CREAM | CUTANEOUS | Status: DC

## 2015-01-29 MED ORDER — PRAVASTATIN SODIUM 80 MG PO TABS: 80.0000 mg | ORAL_TABLET | Freq: Every day | ORAL | Status: DC

## 2015-01-29 NOTE — Progress Notes (Signed)
May ref time six

## 2015-03-01 ENCOUNTER — Other Ambulatory Visit: Payer: Self-pay | Admitting: Family Medicine

## 2015-03-01 NOTE — Telephone Encounter (Signed)
Three mo worth ok both

## 2015-03-08 ENCOUNTER — Telehealth: Payer: Self-pay | Admitting: Family Medicine

## 2015-03-08 NOTE — Telephone Encounter (Signed)
Patient received a letter for jury duty and she needs a letter explaining her disability and why she cant do this by 12/30 please.she wants it mailed.

## 2015-03-12 ENCOUNTER — Encounter: Payer: Self-pay | Admitting: Family Medicine

## 2015-03-31 ENCOUNTER — Other Ambulatory Visit: Payer: Self-pay | Admitting: Family Medicine

## 2015-03-31 NOTE — Telephone Encounter (Signed)
Ok plus three monthly ref 

## 2015-04-08 ENCOUNTER — Ambulatory Visit: Payer: Medicare Other | Admitting: Nurse Practitioner

## 2015-04-09 ENCOUNTER — Ambulatory Visit (INDEPENDENT_AMBULATORY_CARE_PROVIDER_SITE_OTHER): Payer: Medicare Other | Admitting: Nurse Practitioner

## 2015-04-09 ENCOUNTER — Encounter: Payer: Self-pay | Admitting: Nurse Practitioner

## 2015-04-09 VITALS — BP 128/78 | Ht 61.0 in | Wt 161.4 lb

## 2015-04-09 DIAGNOSIS — Z794 Long term (current) use of insulin: Secondary | ICD-10-CM

## 2015-04-09 DIAGNOSIS — J069 Acute upper respiratory infection, unspecified: Secondary | ICD-10-CM | POA: Diagnosis not present

## 2015-04-09 DIAGNOSIS — M778 Other enthesopathies, not elsewhere classified: Secondary | ICD-10-CM

## 2015-04-09 DIAGNOSIS — M7581 Other shoulder lesions, right shoulder: Secondary | ICD-10-CM

## 2015-04-09 DIAGNOSIS — E119 Type 2 diabetes mellitus without complications: Secondary | ICD-10-CM

## 2015-04-09 LAB — POCT GLYCOSYLATED HEMOGLOBIN (HGB A1C): Hemoglobin A1C: 6.3

## 2015-04-09 MED ORDER — AMOXICILLIN-POT CLAVULANATE 875-125 MG PO TABS
1.0000 | ORAL_TABLET | Freq: Two times a day (BID) | ORAL | Status: DC
Start: 2015-04-09 — End: 2015-05-27

## 2015-04-09 MED ORDER — PREDNISONE 20 MG PO TABS: ORAL_TABLET | ORAL | Status: DC

## 2015-04-09 NOTE — Progress Notes (Signed)
Subjective:  Presents for routine follow-up. Has done well with her diet. No chest pain/ischemic type pain or shortness of breath. Also complaints of head congestion over the past week. No fever. Sore throat has improved. Some coughing especially at nighttime producing slight yellow sputum. No headache. Ear pain greater on the right. Has been using her albuterol about twice a day were slight wheezing. Also complaints of right anterior shoulder discomfort off-and-on for the past 1-1/2 months. No specific history of injury. Pain with certain movements. Has been applying icy hot. Has had surgery on the left shoulder in the past, has started those exercises for the right shoulder.  Objective:   BP 128/78 mmHg  Ht 5\' 1"  (1.549 m)  Wt 161 lb 6 oz (73.199 kg)  BMI 30.51 kg/m2 NAD. TMs clear effusion, no erythema. Pharynx minimal erythema, clear PND noted. Neck supple with mild soft anterior adenopathy. Lungs sounds mildly diminished in general with faint expiratory wheeze occasionally noted posterior only. Anterior clear. Heart regular rate rhythm. Lower extremities no edema. Can perform full rotation of the shoulder without difficulty. Can perform all OM of the shoulder with tenderness mainly noted with posterior rotation. Tenderness with palpation of the anterior shoulder joint line near the biceps tendon. Results for orders placed or performed in visit on 04/09/15  POCT glycosylated hemoglobin (Hb A1C)  Result Value Ref Range   Hemoglobin A1C 6.3      Assessment:  Problem List Items Addressed This Visit    None    Visit Diagnoses    Type 2 diabetes mellitus without complication, with long-term current use of insulin (HCC)    -  Primary    Relevant Orders    POCT glycosylated hemoglobin (Hb A1C) (Completed)    Acute upper respiratory infection        Right shoulder tendinitis          Plan:  Meds ordered this encounter  Medications  . predniSONE (DELTASONE) 20 MG tablet    Sig: 2 po qd x 5  d    Dispense:  10 tablet    Refill:  0    Order Specific Question:  Supervising Provider    Answer:  Mikey Kirschner [[id number]]  . amoxicillin-clavulanate (AUGMENTIN) 875-125 MG tablet    Sig: Take 1 tablet by mouth 2 (two) times daily.    Dispense:  20 tablet    Refill:  0    Order Specific Question:  Supervising Provider    Answer:  Maggie Font   Given prescription for prednisone and Augmentin for the weekend in case symptoms worsen. Aleve OTC as directed for shoulder pain. Ice/heat applications. Continue shoulder exercises. Patient to call back in 2 weeks if no improvement, consider orthopedic referral at that time. Encourage patient to continue dietary measures and activity with significant improvement in A1c noted today. Also encourage patient to get her lab work that was ordered in October. Return in about 3 months (around 07/08/2015) for physical.

## 2015-05-13 ENCOUNTER — Other Ambulatory Visit: Payer: Self-pay | Admitting: *Deleted

## 2015-05-13 MED ORDER — PRAVASTATIN SODIUM 80 MG PO TABS: 80.0000 mg | ORAL_TABLET | Freq: Every day | ORAL | Status: DC

## 2015-05-13 MED ORDER — GLIPIZIDE ER 10 MG PO TB24: ORAL_TABLET | ORAL | Status: DC

## 2015-05-13 MED ORDER — OMEPRAZOLE 40 MG PO CPDR: 40.0000 mg | DELAYED_RELEASE_CAPSULE | Freq: Every day | ORAL | Status: DC

## 2015-05-13 MED ORDER — MONTELUKAST SODIUM 10 MG PO TABS: 10.0000 mg | ORAL_TABLET | Freq: Every day | ORAL | Status: DC

## 2015-05-13 MED ORDER — METFORMIN HCL 500 MG PO TABS: ORAL_TABLET | ORAL | Status: DC

## 2015-05-27 ENCOUNTER — Encounter: Payer: Self-pay | Admitting: Nurse Practitioner

## 2015-05-27 ENCOUNTER — Ambulatory Visit (INDEPENDENT_AMBULATORY_CARE_PROVIDER_SITE_OTHER): Payer: Medicare Other | Admitting: Nurse Practitioner

## 2015-05-27 ENCOUNTER — Other Ambulatory Visit: Payer: Self-pay | Admitting: Family Medicine

## 2015-05-27 VITALS — BP 128/76 | Temp 97.5°F | Ht 62.0 in | Wt 159.0 lb

## 2015-05-27 DIAGNOSIS — J4541 Moderate persistent asthma with (acute) exacerbation: Secondary | ICD-10-CM

## 2015-05-27 MED ORDER — AZITHROMYCIN 250 MG PO TABS: ORAL_TABLET | ORAL | Status: DC

## 2015-05-27 MED ORDER — ALBUTEROL SULFATE (2.5 MG/3ML) 0.083% IN NEBU
2.5000 mg | INHALATION_SOLUTION | Freq: Once | RESPIRATORY_TRACT | Status: AC
  Administered 2015-05-27: 2.5 mg via RESPIRATORY_TRACT

## 2015-05-27 MED ORDER — PREDNISONE 20 MG PO TABS: ORAL_TABLET | ORAL | Status: DC

## 2015-05-28 ENCOUNTER — Encounter: Payer: Self-pay | Admitting: Nurse Practitioner

## 2015-05-28 NOTE — Progress Notes (Signed)
Subjective:  Presents for complaints of trouble breathing and shortness of breath scratchy throat congestion and frequent cough for this past 7 days. Completed her Augmentin and prednisone after previous visit in January, symptoms had greatly improved. No fever. Frontal area headache. Wheezing especially at nighttime. Some relief with albuterol nebulizer or inhaler. No ear pain. No sore throat. No edema. No orthopnea. No vomiting diarrhea or abdominal pain. Taking fluids well. Voiding normal limit.  Objective:   BP 128/76 mmHg  Temp(Src) 97.5 F (36.4 C) (Oral)  Ht 5\' 2"  (1.575 m)  Wt 159 lb (72.122 kg)  BMI 29.07 kg/m2  SpO2 91% Alert, oriented. TMs clear effusion, no erythema. Pharynx injected with PND noted. Neck supple with mild soft anterior adenopathy. Lungs initially mildly diminished breath sounds particularly on the right side with faint expiratory wheezes noted throughout lung fields. No tachypnea, normal color but shortness of breath with talking. Given albuterol 2.5 mg nebulizer treatment. No further shortness of breath noted, improved airflow and wheezing resolved. Also subjective improvement of symptoms. Heart regular rate rhythm. Lower extremities no edema.  Assessment: Asthma, moderate persistent, with acute exacerbation - Plan: albuterol (PROVENTIL) (2.5 MG/3ML) 0.083% nebulizer solution 2.5 mg  Plan:  Meds ordered this encounter  Medications  . albuterol (PROVENTIL) (2.5 MG/3ML) 0.083% nebulizer solution 2.5 mg    Sig:   . azithromycin (ZITHROMAX Z-PAK) 250 MG tablet    Sig: Take 2 tablets (500 mg) on  Day 1,  followed by 1 tablet (250 mg) once daily on Days 2 through 5.    Dispense:  6 each    Refill:  0    Order Specific Question:  Supervising Provider    Answer:  Mikey Kirschner [[id number]]  . predniSONE (DELTASONE) 20 MG tablet    Sig: 3 po qd x 3 d then 2 po qd x 3 d then 1 po qd x 3 d    Dispense:  18 tablet    Refill:  0    Order Specific Question:  Supervising  Provider    Answer:  Mikey Kirschner [[id number]]   Albuterol by nebulizer or inhaler as directed. OTC meds as directed for congestion and cough. Warning signs reviewed. Call back in 4 days if no improvement, go to ED over the weekend if worse. Continue Advair as directed.

## 2015-06-02 DIAGNOSIS — E785 Hyperlipidemia, unspecified: Secondary | ICD-10-CM | POA: Diagnosis not present

## 2015-06-03 LAB — HEPATIC FUNCTION PANEL
ALK PHOS: 127 IU/L — AB (ref 39–117)
ALT: 25 IU/L (ref 0–32)
AST: 14 IU/L (ref 0–40)
Albumin: 4.4 g/dL (ref 3.5–5.5)
Bilirubin Total: 0.6 mg/dL (ref 0.0–1.2)
Bilirubin, Direct: 0.19 mg/dL (ref 0.00–0.40)
Total Protein: 7 g/dL (ref 6.0–8.5)

## 2015-06-03 LAB — LIPID PANEL
Chol/HDL Ratio: 3.8 ratio units (ref 0.0–4.4)
Cholesterol, Total: 238 mg/dL — ABNORMAL HIGH (ref 100–199)
HDL: 63 mg/dL (ref >39)
LDL Calculated: 143 mg/dL — ABNORMAL HIGH (ref 0–99)
Triglycerides: 162 mg/dL — ABNORMAL HIGH (ref 0–149)
VLDL Cholesterol Cal: 32 mg/dL (ref 5–40)

## 2015-06-25 ENCOUNTER — Other Ambulatory Visit: Payer: Self-pay | Admitting: Family Medicine

## 2015-07-01 ENCOUNTER — Telehealth: Payer: Self-pay | Admitting: Family Medicine

## 2015-07-01 MED ORDER — AMOXICILLIN-POT CLAVULANATE 875-125 MG PO TABS: ORAL_TABLET | ORAL | Status: DC

## 2015-07-01 NOTE — Telephone Encounter (Signed)
Patient seen on 05/27/15 for asthma.  She said she was given an antibiotic by Hoyle Sauer, but now she feels her symptoms are coming back.  She has cough, congestion, headache, sinus issues, chest and head congestion.  She wants to know if we can call in another antibiotic since it was so recent she was seen.   [place]

## 2015-07-01 NOTE — Telephone Encounter (Signed)
Spoke with patient and asked patient per Dr.Steve Luking- Augmentin 875 1 tablet twice a day for 10 days was sent over to requested pharmacy. Patient verbalized understanding.

## 2015-07-01 NOTE — Telephone Encounter (Signed)
Patient was given a Zpak and prednisone taper.

## 2015-07-01 NOTE — Telephone Encounter (Signed)
Aug 875 bid ten d 

## 2015-07-08 ENCOUNTER — Encounter: Payer: Medicare Other | Admitting: Nurse Practitioner

## 2015-07-14 ENCOUNTER — Ambulatory Visit (INDEPENDENT_AMBULATORY_CARE_PROVIDER_SITE_OTHER): Payer: Medicare Other | Admitting: Nurse Practitioner

## 2015-07-14 ENCOUNTER — Other Ambulatory Visit: Payer: Self-pay | Admitting: Family Medicine

## 2015-07-14 ENCOUNTER — Encounter: Payer: Self-pay | Admitting: Nurse Practitioner

## 2015-07-14 VITALS — BP 130/80 | Ht 62.0 in | Wt 158.6 lb

## 2015-07-14 DIAGNOSIS — Z0000 Encounter for general adult medical examination without abnormal findings: Secondary | ICD-10-CM | POA: Diagnosis not present

## 2015-07-14 DIAGNOSIS — G4700 Insomnia, unspecified: Secondary | ICD-10-CM

## 2015-07-14 DIAGNOSIS — Z01419 Encounter for gynecological examination (general) (routine) without abnormal findings: Secondary | ICD-10-CM

## 2015-07-14 DIAGNOSIS — Z1231 Encounter for screening mammogram for malignant neoplasm of breast: Secondary | ICD-10-CM

## 2015-07-14 MED ORDER — ALPRAZOLAM 1 MG PO TABS: 1.0000 mg | ORAL_TABLET | Freq: Every evening | ORAL | Status: DC | PRN

## 2015-07-14 NOTE — Progress Notes (Signed)
   Subjective:    Patient ID: Maria [name], female    DOB: 02/10/1956, 60 y.o.   MRN: [id number]  HPI presents for her wellness exam. Married, same sexual partner. Has had a hysterectomy, still has her left ovary. No pelvic pain. Just finished a course of amoxicillin for upper respiratory illness and bronchitis, much improved. Has been using albuterol about twice per day, still using Advair. Patient plans to schedule her own mammogram after 5/19. Has her eye exam scheduled for next month. Does not receive dental care. Wears a full denture on the top, no oral lesions. Having difficulty sleeping at night on the Xanax 0.5 mg, requesting an increase in dosage.    Review of Systems  Constitutional: Negative for fever, activity change, appetite change and fatigue.  HENT: Negative for dental problem, ear pain, sinus pressure and sore throat.   Respiratory: Positive for shortness of breath and wheezing. Negative for cough and chest tightness.   Cardiovascular: Negative for chest pain.  Gastrointestinal: Negative for nausea, vomiting, abdominal pain, diarrhea, constipation, blood in stool and abdominal distention.  Genitourinary: Negative for dysuria, urgency, frequency, vaginal discharge, enuresis, difficulty urinating, genital sores and pelvic pain.       Objective:   Physical Exam  Constitutional: She is oriented to person, place, and time. She appears well-developed. No distress.  HENT:  Right Ear: External ear normal.  Left Ear: External ear normal.  Mouth/Throat: Oropharynx is clear and moist.  Neck: Normal range of motion. Neck supple. No tracheal deviation present. No thyromegaly present.  Cardiovascular: Normal rate, regular rhythm and normal heart sounds.  Exam reveals no gallop.   No murmur heard. Pulmonary/Chest: Effort normal. No respiratory distress. She has wheezes. She has no rales.  Faint scattered expiratory wheezes, no tachypnea. Normal color.  Abdominal: Soft. She exhibits  no distension. There is no tenderness.  Genitourinary: Vagina normal. No vaginal discharge found.  External GU normal limit. Vagina pale. Bimanual exam no tenderness or obvious masses, limited due to abdominal girth. Rectal exam no masses.  Musculoskeletal: She exhibits no edema.  Lymphadenopathy:    She has no cervical adenopathy.  Neurological: She is alert and oriented to person, place, and time.  Skin: Skin is warm and dry. No rash noted.  Psychiatric: She has a normal mood and affect. Her behavior is normal.  Vitals reviewed.  breast exam: No masses noted. Axilla no adenopathy.        Assessment & Plan:   Problem List Items Addressed This Visit      Other   Insomnia    Other Visit Diagnoses    Well woman exam    -  Primary    Relevant Orders    POC Hemoccult Bld/Stl (3-Cd Home Screen)      Meds ordered this encounter  Medications  . ALPRAZolam (XANAX) 1 MG tablet    Sig: Take 1 tablet (1 mg total) by mouth at bedtime as needed for sleep.    Dispense:  30 tablet    Refill:  5    Order Specific Question:  Supervising Provider    Answer:  Mikey Kirschner [[id number]]   Increase Xanax to 1 mg at bedtime. Call back if no improvement. Plan mammogram an eye exam for May. Follow-up in 3 months for diabetes checkup.

## 2015-08-11 ENCOUNTER — Ambulatory Visit (HOSPITAL_COMMUNITY)
Admission: RE | Admit: 2015-08-11 | Discharge: 2015-08-11 | Disposition: A | Discharge status: Home / Self Care | Payer: Medicare Other | Source: Ambulatory Visit | Attending: Family Medicine | Admitting: Family Medicine

## 2015-08-11 DIAGNOSIS — Z1231 Encounter for screening mammogram for malignant neoplasm of breast: Secondary | ICD-10-CM | POA: Insufficient documentation

## 2015-08-25 DIAGNOSIS — E119 Type 2 diabetes mellitus without complications: Secondary | ICD-10-CM | POA: Diagnosis not present

## 2015-08-25 DIAGNOSIS — H2511 Age-related nuclear cataract, right eye: Secondary | ICD-10-CM | POA: Diagnosis not present

## 2015-08-25 DIAGNOSIS — H401130 Primary open-angle glaucoma, bilateral, stage unspecified: Secondary | ICD-10-CM | POA: Diagnosis not present

## 2015-08-25 DIAGNOSIS — H35362 Drusen (degenerative) of macula, left eye: Secondary | ICD-10-CM | POA: Diagnosis not present

## 2015-08-25 LAB — HM DIABETES EYE EXAM

## 2015-09-07 ENCOUNTER — Encounter: Payer: Self-pay | Admitting: *Deleted

## 2015-10-13 ENCOUNTER — Ambulatory Visit (INDEPENDENT_AMBULATORY_CARE_PROVIDER_SITE_OTHER): Payer: Medicare Other | Admitting: Nurse Practitioner

## 2015-10-13 ENCOUNTER — Encounter: Payer: Self-pay | Admitting: Nurse Practitioner

## 2015-10-13 VITALS — BP 140/78 | Ht 62.0 in | Wt 158.0 lb

## 2015-10-13 DIAGNOSIS — E1140 Type 2 diabetes mellitus with diabetic neuropathy, unspecified: Secondary | ICD-10-CM | POA: Diagnosis not present

## 2015-10-13 DIAGNOSIS — E785 Hyperlipidemia, unspecified: Secondary | ICD-10-CM | POA: Diagnosis not present

## 2015-10-13 DIAGNOSIS — E119 Type 2 diabetes mellitus without complications: Secondary | ICD-10-CM

## 2015-10-13 DIAGNOSIS — E039 Hypothyroidism, unspecified: Secondary | ICD-10-CM

## 2015-10-13 DIAGNOSIS — Z79899 Other long term (current) drug therapy: Secondary | ICD-10-CM | POA: Diagnosis not present

## 2015-10-13 DIAGNOSIS — Z794 Long term (current) use of insulin: Secondary | ICD-10-CM | POA: Diagnosis not present

## 2015-10-13 DIAGNOSIS — E559 Vitamin D deficiency, unspecified: Secondary | ICD-10-CM

## 2015-10-13 LAB — POCT GLYCOSYLATED HEMOGLOBIN (HGB A1C): Hemoglobin A1C: 6.9

## 2015-10-13 NOTE — Progress Notes (Signed)
Subjective:  Presents for recheck on her diabetes. Since her son has moved out, she and her husband have been eating out more that usual which she thinks caused increase in LDL levels on last lab. Fasting sugars 89-98; highest 109. No CP/ischemic type pain or SOB. Trying to stay active.   Objective:   BP 140/78   Ht 5\' 2"  (1.575 m)   Wt 158 lb (71.7 kg)   BMI 28.90 kg/m  NAD. Alert, oriented. Lungs clear. Heart RRR.   Results for orders placed or performed in visit on 10/13/15  POCT glycosylated hemoglobin (Hb A1C)  Result Value Ref Range   Hemoglobin A1C 6.9      Assessment:  Problem List Items Addressed This Visit      Endocrine   Hypothyroidism (Chronic)   Relevant Orders   TSH   Type 2 diabetes mellitus with diabetic neuropathy (HCC) (Chronic)   Relevant Orders   Microalbumin / creatinine urine ratio     Other   Hyperlipidemia with target LDL less than 100 (Chronic)   Relevant Orders   Lipid panel   Vitamin D deficiency   Relevant Orders   VITAMIN D 25 Hydroxy (Vit-D Deficiency, Fractures)    Other Visit Diagnoses    Type 2 diabetes mellitus without complication, unspecified long term insulin use status ([place])    -  Primary   Relevant Orders   POCT glycosylated hemoglobin (Hb A1C) (Completed)   High risk medication use       Relevant Orders   Hepatic function panel   Basic metabolic panel     Reduce fat and cholesterol in diet. Recheck in 3 months with labs a week before.

## 2015-10-27 ENCOUNTER — Other Ambulatory Visit: Payer: Self-pay | Admitting: Family Medicine

## 2015-12-14 ENCOUNTER — Other Ambulatory Visit: Payer: Self-pay | Admitting: Family Medicine

## 2016-01-13 ENCOUNTER — Other Ambulatory Visit: Payer: Self-pay | Admitting: Family Medicine

## 2016-01-13 ENCOUNTER — Ambulatory Visit (INDEPENDENT_AMBULATORY_CARE_PROVIDER_SITE_OTHER): Payer: Medicare Other | Admitting: Nurse Practitioner

## 2016-01-13 ENCOUNTER — Encounter: Payer: Self-pay | Admitting: Nurse Practitioner

## 2016-01-13 VITALS — BP 130/72 | Ht 62.0 in | Wt 163.5 lb

## 2016-01-13 DIAGNOSIS — Z794 Long term (current) use of insulin: Secondary | ICD-10-CM

## 2016-01-13 DIAGNOSIS — S39012A Strain of muscle, fascia and tendon of lower back, initial encounter: Secondary | ICD-10-CM | POA: Diagnosis not present

## 2016-01-13 DIAGNOSIS — G8929 Other chronic pain: Secondary | ICD-10-CM | POA: Diagnosis not present

## 2016-01-13 DIAGNOSIS — Z79899 Other long term (current) drug therapy: Secondary | ICD-10-CM | POA: Diagnosis not present

## 2016-01-13 DIAGNOSIS — M25511 Pain in right shoulder: Secondary | ICD-10-CM | POA: Diagnosis not present

## 2016-01-13 DIAGNOSIS — E119 Type 2 diabetes mellitus without complications: Secondary | ICD-10-CM

## 2016-01-13 DIAGNOSIS — Z23 Encounter for immunization: Secondary | ICD-10-CM | POA: Diagnosis not present

## 2016-01-13 DIAGNOSIS — E039 Hypothyroidism, unspecified: Secondary | ICD-10-CM | POA: Diagnosis not present

## 2016-01-13 DIAGNOSIS — E1140 Type 2 diabetes mellitus with diabetic neuropathy, unspecified: Secondary | ICD-10-CM | POA: Diagnosis not present

## 2016-01-13 DIAGNOSIS — E785 Hyperlipidemia, unspecified: Secondary | ICD-10-CM | POA: Diagnosis not present

## 2016-01-13 LAB — POCT GLYCOSYLATED HEMOGLOBIN (HGB A1C): Hemoglobin A1C: 7.1

## 2016-01-13 MED ORDER — CYCLOBENZAPRINE HCL 10 MG PO TABS: 10.0000 mg | ORAL_TABLET | Freq: Three times a day (TID) | ORAL | 0 refills | Status: DC | PRN

## 2016-01-13 MED ORDER — NAPROXEN 500 MG PO TABS: 500.0000 mg | ORAL_TABLET | Freq: Two times a day (BID) | ORAL | 0 refills | Status: DC

## 2016-01-13 MED ORDER — HYDROCODONE-ACETAMINOPHEN 10-325 MG PO TABS: 1.0000 | ORAL_TABLET | Freq: Three times a day (TID) | ORAL | 0 refills | Status: DC | PRN

## 2016-01-13 NOTE — Progress Notes (Signed)
Subjective:  Presents for routine follow-up. Compliant with medications. Has been less active lately due to some back and shoulder issues. Having mild cough and congestion. Occasional mild wheeze, has not used her albuterol at this point. Is using her Advair. Complaints of right shoulder pain that began at the last visit see previous note. Has been worse over the past 2 months. Has been lifting her granddaughter who weighs about 32 pounds. In addition has had some generalized low back discomfort worse with certain movements. Does not radiate down the legs. No abdominal pain. Has a history of surgery on her left shoulder.  Objective:   BP 130/72   Ht 5\' 2"  (1.575 m)   Wt 163 lb 8 oz (74.2 kg)   BMI 29.90 kg/m  NAD. Alert, oriented. Lungs clear. Heart regular rate rhythm. Distinct tenderness noted in the anterior and mid right shoulder joint line. Can perform range of motion but increased tenderness and slight limitation as compared to previous exam. Hand strength 5+ bilateral. Generalized lumbar sacral area tenderness to palpation. SLR negative bilaterally. Gait slow but steady. Results for orders placed or performed in visit on 01/13/16  POCT glycosylated hemoglobin (Hb A1C)  Result Value Ref Range   Hemoglobin A1C 7.1    Diabetic Foot Exam - Simple   Simple Foot Form Diabetic Foot exam was performed with the following findings:  Yes 01/13/2016  9:40 AM  Visual Inspection No deformities, no ulcerations, no other skin breakdown bilaterally:  Yes Sensation Testing Intact to touch and monofilament testing bilaterally:  Yes Pulse Check Posterior Tibialis and Dorsalis pulse intact bilaterally:  Yes Comments Sensation intact but slight numbness in both great toes      Assessment:  Problem List Items Addressed This Visit    None    Visit Diagnoses    Type 2 diabetes mellitus without complication, with long-term current use of insulin (HCC)    -  Primary   Relevant Orders   POCT  glycosylated hemoglobin (Hb A1C) (Completed)   Chronic right shoulder pain       Strain of lumbar region, initial encounter       Need for vaccination       Relevant Orders   Flu Vaccine QUAD 36+ mos PF IM (Fluarix & Fluzone Quad PF) (Completed)     Plan:  Meds ordered this encounter  Medications  . naproxen (NAPROSYN) 500 MG tablet    Sig: Take 1 tablet (500 mg total) by mouth 2 (two) times daily with a meal.    Dispense:  30 tablet    Refill:  0    Order Specific Question:   Supervising Provider    Answer:   Mikey Kirschner [[id number]]  . cyclobenzaprine (FLEXERIL) 10 MG tablet    Sig: Take 1 tablet (10 mg total) by mouth 3 (three) times daily as needed for muscle spasms.    Dispense:  30 tablet    Refill:  0    Order Specific Question:   Supervising Provider    Answer:   Mikey Kirschner [[id number]]  . HYDROcodone-acetaminophen (NORCO) 10-325 MG tablet    Sig: Take 1 tablet by mouth every 8 (eight) hours as needed.    Dispense:  30 tablet    Refill:  0    Order Specific Question:   Supervising Provider    Answer:   LUKING, WILLIAM S [id number]   Ice/heat applications to affected areas. Gentle stretching exercises. Due to the chronic nature of her right shoulder  pain, will refer back to her orthopedic specialist for further evaluation. Recommend limiting amount of weight lifting. Call back in 2 weeks if back pain persists, sooner if worse.

## 2016-01-14 LAB — MICROALBUMIN / CREATININE URINE RATIO: Creatinine, Urine: 66.7 mg/dL

## 2016-01-14 LAB — HEPATIC FUNCTION PANEL
ALBUMIN: 4.3 g/dL (ref 3.5–5.5)
ALK PHOS: 125 IU/L — AB (ref 39–117)
ALT: 19 IU/L (ref 0–32)
AST: 18 IU/L (ref 0–40)
BILIRUBIN TOTAL: 0.7 mg/dL (ref 0.0–1.2)
Bilirubin, Direct: 0.19 mg/dL (ref 0.00–0.40)
Total Protein: 6.8 g/dL (ref 6.0–8.5)

## 2016-01-14 LAB — BASIC METABOLIC PANEL
BUN/Creatinine Ratio: 18 (ref 9–23)
BUN: 12 mg/dL (ref 6–24)
CO2: 26 mmol/L (ref 18–29)
Calcium: 9.2 mg/dL (ref 8.7–10.2)
Chloride: 101 mmol/L (ref 96–106)
Creatinine, Ser: 0.68 mg/dL (ref 0.57–1.00)
GFR, EST AFRICAN AMERICAN: 111 mL/min/{1.73_m2} (ref >59)
GFR, EST NON AFRICAN AMERICAN: 96 mL/min/{1.73_m2} (ref >59)
Glucose: 105 mg/dL — ABNORMAL HIGH (ref 65–99)
POTASSIUM: 3.9 mmol/L (ref 3.5–5.2)
SODIUM: 142 mmol/L (ref 134–144)

## 2016-01-14 LAB — LIPID PANEL
CHOLESTEROL TOTAL: 180 mg/dL (ref 100–199)
Chol/HDL Ratio: 4.3 ratio units (ref 0.0–4.4)
HDL: 42 mg/dL (ref >39)
LDL Calculated: 109 mg/dL — ABNORMAL HIGH (ref 0–99)
Triglycerides: 145 mg/dL (ref 0–149)
VLDL Cholesterol Cal: 29 mg/dL (ref 5–40)

## 2016-01-14 LAB — TSH: TSH: 2.72 u[IU]/mL (ref 0.450–4.500)

## 2016-01-14 LAB — VITAMIN D 25 HYDROXY (VIT D DEFICIENCY, FRACTURES): Vit D, 25-Hydroxy: 34.6 ng/mL (ref 30.0–100.0)

## 2016-01-17 ENCOUNTER — Encounter: Payer: Self-pay | Admitting: Family Medicine

## 2016-01-31 DIAGNOSIS — M7541 Impingement syndrome of right shoulder: Secondary | ICD-10-CM | POA: Diagnosis not present

## 2016-02-01 DIAGNOSIS — H2511 Age-related nuclear cataract, right eye: Secondary | ICD-10-CM | POA: Diagnosis not present

## 2016-02-01 DIAGNOSIS — H25812 Combined forms of age-related cataract, left eye: Secondary | ICD-10-CM | POA: Diagnosis not present

## 2016-02-01 DIAGNOSIS — H401134 Primary open-angle glaucoma, bilateral, indeterminate stage: Secondary | ICD-10-CM | POA: Diagnosis not present

## 2016-02-14 ENCOUNTER — Other Ambulatory Visit: Payer: Self-pay | Admitting: Family Medicine

## 2016-03-14 ENCOUNTER — Other Ambulatory Visit: Payer: Self-pay | Admitting: Family Medicine

## 2016-03-15 ENCOUNTER — Ambulatory Visit (INDEPENDENT_AMBULATORY_CARE_PROVIDER_SITE_OTHER): Payer: Medicare Other | Admitting: Nurse Practitioner

## 2016-03-15 ENCOUNTER — Encounter: Payer: Self-pay | Admitting: Nurse Practitioner

## 2016-03-15 VITALS — BP 122/82 | Temp 98.2°F | Ht 62.0 in | Wt 162.6 lb

## 2016-03-15 DIAGNOSIS — J0100 Acute maxillary sinusitis, unspecified: Secondary | ICD-10-CM | POA: Diagnosis not present

## 2016-03-15 DIAGNOSIS — L609 Nail disorder, unspecified: Secondary | ICD-10-CM | POA: Diagnosis not present

## 2016-03-15 DIAGNOSIS — L608 Other nail disorders: Secondary | ICD-10-CM

## 2016-03-15 MED ORDER — AMOXICILLIN-POT CLAVULANATE 875-125 MG PO TABS: 1.0000 | ORAL_TABLET | Freq: Two times a day (BID) | ORAL | 0 refills | Status: DC

## 2016-03-15 NOTE — Patient Instructions (Signed)
Tea tree oil

## 2016-03-15 NOTE — Progress Notes (Signed)
Subjective:  Presents for complaints of slight discoloration on the nailbed of her left great toe for the past couple of weeks. No history of injury. Nontender. Also has been having a flareup of her sinuses for the past few weeks. Maxillary area pressure. Pain in the area of the right ear. No sore throat. No fever. Occasional cough. Asthma has been stable other than an occasional flareup with cold weather.  Objective:   BP 122/82   Temp 98.2 F (36.8 C) (Oral)   Ht 5\' 2"  (1.575 m)   Wt 162 lb 9.6 oz (73.8 kg)   BMI 29.74 kg/m  NAD. Alert, oriented. TMs clear effusion, much more on the right. Pharynx clear. Neck supple with minimal adenopathy. Lungs rare expiratory wheeze, otherwise clear. Heart regular rate rhythm. Faint linear purplish discoloration noted on the medial part of the left great toe nailbed. Slight separation of the nail from the nailbed noted, no erythema tenderness or edema. Strong DP pulse. Strong capillary refill.  Assessment: Acute non-recurrent maxillary sinusitis  Deformity of nail bed  Plan:  Meds ordered this encounter  Medications  . amoxicillin-clavulanate (AUGMENTIN) 875-125 MG tablet    Sig: Take 1 tablet by mouth 2 (two) times daily.    Dispense:  20 tablet    Refill:  0    Order Specific Question:   Supervising Provider    Answer:   Mikey Kirschner [[id number]]   OTC topicals as directed for nailbed to help prevent fungal infection. Expect gradual resolution of symptoms. Warning signs reviewed. Call back if worsens or persists. Continue indication for sinuses. Start Augmentin as directed. Call back if persists.

## 2016-03-24 ENCOUNTER — Other Ambulatory Visit: Payer: Self-pay | Admitting: *Deleted

## 2016-03-24 ENCOUNTER — Other Ambulatory Visit: Payer: Self-pay | Admitting: Family Medicine

## 2016-03-24 ENCOUNTER — Other Ambulatory Visit: Payer: Self-pay | Admitting: Nurse Practitioner

## 2016-03-24 MED ORDER — FLUTICASONE-SALMETEROL 500-50 MCG/DOSE IN AEPB: INHALATION_SPRAY | RESPIRATORY_TRACT | 5 refills | Status: DC

## 2016-03-24 MED ORDER — ONDANSETRON 4 MG PO TBDP: 4.0000 mg | ORAL_TABLET | Freq: Four times a day (QID) | ORAL | 0 refills | Status: DC | PRN

## 2016-03-24 MED ORDER — ALPRAZOLAM 1 MG PO TABS: 1.0000 mg | ORAL_TABLET | Freq: Every evening | ORAL | 5 refills | Status: DC | PRN

## 2016-03-24 MED ORDER — METFORMIN HCL 500 MG PO TABS: ORAL_TABLET | ORAL | 1 refills | Status: DC

## 2016-03-24 MED ORDER — TRIAMCINOLONE ACETONIDE 0.1 % EX CREA: TOPICAL_CREAM | CUTANEOUS | 5 refills | Status: DC

## 2016-03-24 MED ORDER — CYCLOBENZAPRINE HCL 10 MG PO TABS: 10.0000 mg | ORAL_TABLET | Freq: Three times a day (TID) | ORAL | 5 refills | Status: DC | PRN

## 2016-03-24 MED ORDER — MONTELUKAST SODIUM 10 MG PO TABS
10.0000 mg | ORAL_TABLET | Freq: Every day | ORAL | 1 refills | Status: DC
Start: 2016-03-24 — End: 2016-06-16

## 2016-03-24 MED ORDER — GLIPIZIDE ER 10 MG PO TB24: ORAL_TABLET | ORAL | 5 refills | Status: DC

## 2016-03-24 MED ORDER — PRAVASTATIN SODIUM 80 MG PO TABS
80.0000 mg | ORAL_TABLET | Freq: Every day | ORAL | 1 refills | Status: DC
Start: 2016-03-24 — End: 2016-06-26

## 2016-03-24 MED ORDER — INSULIN GLARGINE 100 UNIT/ML SOLOSTAR PEN: PEN_INJECTOR | SUBCUTANEOUS | 5 refills | Status: DC

## 2016-03-24 MED ORDER — LEVOTHYROXINE SODIUM 75 MCG PO TABS: 75.0000 ug | ORAL_TABLET | Freq: Every day | ORAL | 1 refills | Status: DC

## 2016-03-24 NOTE — Telephone Encounter (Signed)
Six mo all meds, plz have front call pt snd tell her we did this ahead of time for insur pruposes, but plz f u end of jan as rec by carolyn end of oct

## 2016-03-27 NOTE — Telephone Encounter (Signed)
Maria [name] saw pt for sinusitis 03/15/16.  Last refill sent 2016. Ok to refill?

## 2016-03-29 ENCOUNTER — Other Ambulatory Visit: Payer: Self-pay | Admitting: *Deleted

## 2016-03-29 MED ORDER — OMEPRAZOLE 40 MG PO CPDR: 40.0000 mg | DELAYED_RELEASE_CAPSULE | Freq: Every day | ORAL | 0 refills | Status: DC

## 2016-04-17 ENCOUNTER — Ambulatory Visit (INDEPENDENT_AMBULATORY_CARE_PROVIDER_SITE_OTHER): Payer: Medicare Other | Admitting: Nurse Practitioner

## 2016-04-17 ENCOUNTER — Encounter: Payer: Self-pay | Admitting: Nurse Practitioner

## 2016-04-17 VITALS — BP 122/82 | Ht 62.0 in | Wt 162.4 lb

## 2016-04-17 DIAGNOSIS — E1143 Type 2 diabetes mellitus with diabetic autonomic (poly)neuropathy: Secondary | ICD-10-CM

## 2016-04-17 DIAGNOSIS — K3184 Gastroparesis: Secondary | ICD-10-CM

## 2016-04-17 DIAGNOSIS — E1140 Type 2 diabetes mellitus with diabetic neuropathy, unspecified: Secondary | ICD-10-CM | POA: Diagnosis not present

## 2016-04-17 DIAGNOSIS — Z794 Long term (current) use of insulin: Secondary | ICD-10-CM | POA: Diagnosis not present

## 2016-04-17 LAB — POCT GLYCOSYLATED HEMOGLOBIN (HGB A1C): Hemoglobin A1C: 5.8

## 2016-04-17 MED ORDER — ONDANSETRON 4 MG PO TBDP: 4.0000 mg | ORAL_TABLET | Freq: Four times a day (QID) | ORAL | 2 refills | Status: DC | PRN

## 2016-04-19 ENCOUNTER — Encounter: Payer: Self-pay | Admitting: Nurse Practitioner

## 2016-04-19 DIAGNOSIS — K3184 Gastroparesis: Secondary | ICD-10-CM

## 2016-04-19 DIAGNOSIS — E1143 Type 2 diabetes mellitus with diabetic autonomic (poly)neuropathy: Secondary | ICD-10-CM | POA: Insufficient documentation

## 2016-04-19 NOTE — Progress Notes (Signed)
Subjective:  Presents for c/o a "sick feeling" after eating. Unassociated with any particular foods. Will sometimes occur several days in a row. Reflux stable. Better after lying down. Was diagnosed with diabetic gastroparesis by GI specialist in the past. Was off med for awhile but recently flared up again. Was on Reglan at one point but stopped this due to potential side effects.   Objective:   BP 122/82   Ht 5\' 2"  (1.575 m)   Wt 162 lb 6.4 oz (73.7 kg)   BMI 29.70 kg/m  NAD. Alert, oriented. Lungs clear. Heart RRR. Abdomen obese, soft, non distended, non tender.   Assessment:   Problem List Items Addressed This Visit      Digestive   Diabetic gastroparesis associated with type 2 diabetes mellitus ([place])     Endocrine   Type 2 diabetes mellitus with diabetic neuropathy ([place]) - Primary (Chronic)   Relevant Orders   POCT glycosylated hemoglobin (Hb A1C) (Completed)       Plan:  Meds ordered this encounter  Medications  . ondansetron (ZOFRAN ODT) 4 MG disintegrating tablet    Sig: Take 1 tablet (4 mg total) by mouth every 6 (six) hours as needed for nausea or vomiting.    Dispense:  30 tablet    Refill:  2    Order Specific Question:   Supervising Provider    Answer:   Mikey Kirschner [[id number]]     Restart Zofran as directed. Call back in 2 weeks if no improvement. Consider restarting Reglan if no improvement.

## 2016-04-24 ENCOUNTER — Other Ambulatory Visit: Payer: Self-pay | Admitting: Family Medicine

## 2016-05-02 DIAGNOSIS — H401134 Primary open-angle glaucoma, bilateral, indeterminate stage: Secondary | ICD-10-CM | POA: Diagnosis not present

## 2016-05-02 DIAGNOSIS — H25812 Combined forms of age-related cataract, left eye: Secondary | ICD-10-CM | POA: Diagnosis not present

## 2016-05-02 DIAGNOSIS — H35362 Drusen (degenerative) of macula, left eye: Secondary | ICD-10-CM | POA: Diagnosis not present

## 2016-05-02 DIAGNOSIS — H2511 Age-related nuclear cataract, right eye: Secondary | ICD-10-CM | POA: Diagnosis not present

## 2016-05-16 ENCOUNTER — Ambulatory Visit (INDEPENDENT_AMBULATORY_CARE_PROVIDER_SITE_OTHER): Payer: Medicare Other | Admitting: Family Medicine

## 2016-05-16 ENCOUNTER — Encounter: Payer: Self-pay | Admitting: Family Medicine

## 2016-05-16 VITALS — BP 140/76 | Temp 98.2°F | Ht 61.0 in | Wt 162.5 lb

## 2016-05-16 DIAGNOSIS — J4541 Moderate persistent asthma with (acute) exacerbation: Secondary | ICD-10-CM

## 2016-05-16 DIAGNOSIS — J329 Chronic sinusitis, unspecified: Secondary | ICD-10-CM | POA: Diagnosis not present

## 2016-05-16 MED ORDER — AMOXICILLIN-POT CLAVULANATE 875-125 MG PO TABS: 1.0000 | ORAL_TABLET | Freq: Two times a day (BID) | ORAL | 0 refills | Status: DC

## 2016-05-16 MED ORDER — PREDNISONE 20 MG PO TABS: ORAL_TABLET | ORAL | 0 refills | Status: DC

## 2016-05-16 NOTE — Progress Notes (Signed)
   Subjective:    Patient ID: Maria [name], female    DOB: Jun 25, 1955, 61 y.o.   MRN: [id number]  Cough  This is a new problem. The current episode started 1 to 4 weeks ago. Associated symptoms include ear pain, headaches, rhinorrhea and wheezing.   Started with cong and dranage and runny nose  Hen developed a cough  Trouble breathing and wheezing  Using inhaler and the breathing machine  With any typ of walking gets sob and coughing spells  Energy poor  had alb this morn, using about evey four hrs reg  Patient states no other concern this visit.   Pos strep exp last mo  Review of Systems  HENT: Positive for ear pain and rhinorrhea.   Respiratory: Positive for cough and wheezing.   Neurological: Positive for headaches.       Objective:   Physical Exam  Alert, mild malaise. Hydration good Vitals stable. frontal/ maxillary tenderness evident positive nasal congestion. pharynx normal neck supple  lungs clear/no crackles other than positive bilaterally wheezes. heart regular in rhythm       Assessment & Plan:  Impression rhinosinusitis/Bronchitis with exacerbation of reactive airways likely post viral, discussed with patient. plan antibiotics prescribed. Questions answered. Symptomatic care discussed. warning signs discussed. WSL

## 2016-05-17 ENCOUNTER — Other Ambulatory Visit: Payer: Self-pay | Admitting: Family Medicine

## 2016-06-05 ENCOUNTER — Other Ambulatory Visit: Payer: Self-pay | Admitting: Family Medicine

## 2016-06-16 ENCOUNTER — Other Ambulatory Visit: Payer: Self-pay | Admitting: Family Medicine

## 2016-06-26 ENCOUNTER — Other Ambulatory Visit: Payer: Self-pay | Admitting: Family Medicine

## 2016-07-17 ENCOUNTER — Encounter: Payer: Self-pay | Admitting: Nurse Practitioner

## 2016-07-17 ENCOUNTER — Ambulatory Visit (INDEPENDENT_AMBULATORY_CARE_PROVIDER_SITE_OTHER): Payer: Medicare Other | Admitting: Nurse Practitioner

## 2016-07-17 ENCOUNTER — Other Ambulatory Visit: Payer: Self-pay | Admitting: Family Medicine

## 2016-07-17 VITALS — BP 140/70 | Ht 61.0 in | Wt 160.1 lb

## 2016-07-17 DIAGNOSIS — E785 Hyperlipidemia, unspecified: Secondary | ICD-10-CM

## 2016-07-17 DIAGNOSIS — Z0000 Encounter for general adult medical examination without abnormal findings: Secondary | ICD-10-CM | POA: Diagnosis not present

## 2016-07-17 DIAGNOSIS — E1140 Type 2 diabetes mellitus with diabetic neuropathy, unspecified: Secondary | ICD-10-CM | POA: Diagnosis not present

## 2016-07-17 DIAGNOSIS — Z794 Long term (current) use of insulin: Secondary | ICD-10-CM | POA: Diagnosis not present

## 2016-07-17 DIAGNOSIS — Z79899 Other long term (current) drug therapy: Secondary | ICD-10-CM

## 2016-07-17 NOTE — Progress Notes (Signed)
   Subjective:    Patient ID: Maria [name], female    DOB: [birth year], 61 y.o.   MRN: [id number]  HPI presents for her wellness exam. Same sexual partner. No pelvic pain. Regular eye exams. Plans to schedule her own mammogram. Dentures on top; missing teeth on bottom. Denies any sores or lesions. Active lifestyle.     Review of Systems  Constitutional: Negative for activity change, appetite change and fatigue.  HENT: Negative for dental problem, ear pain, mouth sores, sinus pressure and sore throat.   Respiratory: Negative for cough, chest tightness, shortness of breath and wheezing.   Cardiovascular: Negative for chest pain.  Gastrointestinal: Negative for abdominal distention, abdominal pain, blood in stool, constipation, diarrhea, nausea and vomiting.  Genitourinary: Negative for difficulty urinating, dysuria, enuresis, frequency, genital sores, pelvic pain, urgency and vaginal discharge.       Objective:   Physical Exam  Constitutional: She is oriented to person, place, and time. She appears well-developed. No distress.  HENT:  Right Ear: External ear normal.  Left Ear: External ear normal.  Mouth/Throat: Oropharynx is clear and moist.  Neck: Normal range of motion. Neck supple. No tracheal deviation present. No thyromegaly present.  Cardiovascular: Normal rate, regular rhythm and normal heart sounds.  Exam reveals no gallop.   No murmur heard. Pulmonary/Chest: Effort normal and breath sounds normal.  Abdominal: Soft. She exhibits no distension. There is no tenderness.  Genitourinary: Vagina normal. No vaginal discharge found.  Genitourinary Comments: External GU: no rashes or lesions. Vagina: pale, slightly dry; no discharge. Bimanual exam: no tenderness or obvious masses. Exam limited due to abd girth.   Musculoskeletal: She exhibits no edema.  Lymphadenopathy:    She has no cervical adenopathy.  Neurological: She is alert and oriented to person, place, and time.  Skin:  Skin is warm and dry. No rash noted.  Psychiatric: She has a normal mood and affect. Her behavior is normal.  Vitals reviewed. Breast exam: no masses; axillae no adenopathy.         Assessment & Plan:   Problem List Items Addressed This Visit      Endocrine   Type 2 diabetes mellitus with diabetic neuropathy (HCC) (Chronic)   Relevant Orders   HgB A1c     Other   Hyperlipidemia with target LDL less than 100 (Chronic)   Relevant Orders   Lipid panel   Hepatic function panel    Other Visit Diagnoses    Routine general medical examination at a health care facility    -  Primary   High risk medication use       Relevant Orders   Hepatic function panel     Encouraged continued activity. Taking daily vitamin D and calcium. Labs pending. Patient to schedule her own mammogram.  Return in about 4 months (around 11/16/2016) for recheck.

## 2016-07-18 ENCOUNTER — Other Ambulatory Visit: Payer: Self-pay | Admitting: Nurse Practitioner

## 2016-07-18 DIAGNOSIS — Z79899 Other long term (current) drug therapy: Secondary | ICD-10-CM

## 2016-07-18 DIAGNOSIS — E785 Hyperlipidemia, unspecified: Secondary | ICD-10-CM

## 2016-07-18 LAB — LIPID PANEL
CHOL/HDL RATIO: 4.6 ratio — AB (ref 0.0–4.4)
Cholesterol, Total: 196 mg/dL (ref 100–199)
HDL: 43 mg/dL (ref >39)
LDL Calculated: 130 mg/dL — ABNORMAL HIGH (ref 0–99)
Triglycerides: 116 mg/dL (ref 0–149)
VLDL CHOLESTEROL CAL: 23 mg/dL (ref 5–40)

## 2016-07-18 LAB — HEPATIC FUNCTION PANEL
ALK PHOS: 118 IU/L — AB (ref 39–117)
ALT: 19 IU/L (ref 0–32)
AST: 18 IU/L (ref 0–40)
Albumin: 4.2 g/dL (ref 3.6–4.8)
Bilirubin Total: 0.8 mg/dL (ref 0.0–1.2)
Bilirubin, Direct: 0.21 mg/dL (ref 0.00–0.40)
Total Protein: 6.6 g/dL (ref 6.0–8.5)

## 2016-07-18 LAB — HEMOGLOBIN A1C
ESTIMATED AVERAGE GLUCOSE: 197 mg/dL
Hgb A1c MFr Bld: 8.5 % — ABNORMAL HIGH (ref 4.8–5.6)

## 2016-07-18 MED ORDER — ROSUVASTATIN CALCIUM 20 MG PO TABS: 20.0000 mg | ORAL_TABLET | Freq: Every day | ORAL | 0 refills | Status: DC

## 2016-07-18 MED ORDER — ROSUVASTATIN CALCIUM 20 MG PO TABS: 20.0000 mg | ORAL_TABLET | Freq: Every day | ORAL | 1 refills | Status: DC

## 2016-07-18 NOTE — Progress Notes (Signed)
Spoke with patient and informed her per [facility] Cholesterol medication was sent into pharmacy. Repeat lipid and hepatic in 8-10 weeks. Patient verbalized understanding

## 2016-07-24 DIAGNOSIS — H25812 Combined forms of age-related cataract, left eye: Secondary | ICD-10-CM | POA: Diagnosis not present

## 2016-07-24 DIAGNOSIS — H2511 Age-related nuclear cataract, right eye: Secondary | ICD-10-CM | POA: Diagnosis not present

## 2016-07-24 DIAGNOSIS — H401131 Primary open-angle glaucoma, bilateral, mild stage: Secondary | ICD-10-CM | POA: Diagnosis not present

## 2016-07-24 DIAGNOSIS — H35362 Drusen (degenerative) of macula, left eye: Secondary | ICD-10-CM | POA: Diagnosis not present

## 2016-08-08 ENCOUNTER — Other Ambulatory Visit: Payer: Self-pay | Admitting: Nurse Practitioner

## 2016-08-08 NOTE — Telephone Encounter (Signed)
Ok plus five ref monthly

## 2016-08-17 ENCOUNTER — Other Ambulatory Visit: Payer: Self-pay | Admitting: Family Medicine

## 2016-08-23 ENCOUNTER — Other Ambulatory Visit: Payer: Self-pay | Admitting: Family Medicine

## 2016-08-23 DIAGNOSIS — Z1231 Encounter for screening mammogram for malignant neoplasm of breast: Secondary | ICD-10-CM

## 2016-08-30 ENCOUNTER — Ambulatory Visit (HOSPITAL_COMMUNITY)
Admission: RE | Admit: 2016-08-30 | Discharge: 2016-08-30 | Disposition: A | Discharge status: Home / Self Care | Payer: Medicare Other | Source: Ambulatory Visit | Attending: Family Medicine | Admitting: Family Medicine

## 2016-08-30 DIAGNOSIS — Z1231 Encounter for screening mammogram for malignant neoplasm of breast: Secondary | ICD-10-CM | POA: Insufficient documentation

## 2016-09-06 ENCOUNTER — Ambulatory Visit (INDEPENDENT_AMBULATORY_CARE_PROVIDER_SITE_OTHER): Payer: Medicare Other | Admitting: Nurse Practitioner

## 2016-09-06 ENCOUNTER — Encounter: Payer: Self-pay | Admitting: Nurse Practitioner

## 2016-09-06 VITALS — BP 144/74 | Temp 98.1°F | Ht 61.0 in | Wt 159.0 lb

## 2016-09-06 DIAGNOSIS — N611 Abscess of the breast and nipple: Secondary | ICD-10-CM | POA: Diagnosis not present

## 2016-09-06 MED ORDER — CLINDAMYCIN HCL 300 MG PO CAPS: 300.0000 mg | ORAL_CAPSULE | Freq: Three times a day (TID) | ORAL | 0 refills | Status: DC

## 2016-09-06 NOTE — Progress Notes (Signed)
Subjective:  Presents for c/o abscess on right breast that began about 7 days ago. Had a mammogram last week. Patient states it was seen but told it was not a mass. No fever. Very painful. Began draining yellowish drainage 3 days ago. Blood sugars stable; 127 this am.   Objective:   BP (!) 144/74   Temp 98.1 F (36.7 C) (Oral)   Ht 5\' 1"  (1.549 m)   Wt 159 lb (72.1 kg)   BMI 30.04 kg/m  NAD. Alert, oriented. 2-3 cm erythematous superficial open erythematous area outer right breast. No deep mass noted. Very tender. Small amount of bloody drainage obtained for culture.   Assessment:  Breast abscess - Plan: WOUND CULTURE    Plan:   Meds ordered this encounter  Medications  . clindamycin (CLEOCIN) 300 MG capsule    Sig: Take 1 capsule (300 mg total) by mouth 3 (three) times daily.    Dispense:  30 capsule    Refill:  0    Order Specific Question:   Supervising Provider    Answer:   Mikey Kirschner [[id number]]   Warning signs reviewed. Call back in 48 hours if no improvement, sooner if worse.

## 2016-09-11 LAB — WOUND CULTURE: Organism ID, Bacteria: NONE SEEN

## 2016-09-21 DIAGNOSIS — H35362 Drusen (degenerative) of macula, left eye: Secondary | ICD-10-CM | POA: Diagnosis not present

## 2016-09-21 DIAGNOSIS — E119 Type 2 diabetes mellitus without complications: Secondary | ICD-10-CM | POA: Diagnosis not present

## 2016-09-21 DIAGNOSIS — H401132 Primary open-angle glaucoma, bilateral, moderate stage: Secondary | ICD-10-CM | POA: Diagnosis not present

## 2016-09-21 DIAGNOSIS — H25812 Combined forms of age-related cataract, left eye: Secondary | ICD-10-CM | POA: Diagnosis not present

## 2016-10-20 ENCOUNTER — Other Ambulatory Visit: Payer: Self-pay | Admitting: Family Medicine

## 2016-10-30 ENCOUNTER — Encounter: Payer: Self-pay | Admitting: Nurse Practitioner

## 2016-10-30 ENCOUNTER — Ambulatory Visit (INDEPENDENT_AMBULATORY_CARE_PROVIDER_SITE_OTHER): Payer: Medicare Other | Admitting: Nurse Practitioner

## 2016-10-30 VITALS — BP 132/84 | Temp 97.6°F | Ht 61.0 in | Wt 158.8 lb

## 2016-10-30 DIAGNOSIS — J4521 Mild intermittent asthma with (acute) exacerbation: Secondary | ICD-10-CM | POA: Diagnosis not present

## 2016-10-30 DIAGNOSIS — J300 Vasomotor rhinitis: Secondary | ICD-10-CM | POA: Diagnosis not present

## 2016-10-30 DIAGNOSIS — B369 Superficial mycosis, unspecified: Secondary | ICD-10-CM | POA: Diagnosis not present

## 2016-10-30 DIAGNOSIS — E119 Type 2 diabetes mellitus without complications: Secondary | ICD-10-CM

## 2016-10-30 DIAGNOSIS — F418 Other specified anxiety disorders: Secondary | ICD-10-CM | POA: Diagnosis not present

## 2016-10-30 LAB — POCT GLYCOSYLATED HEMOGLOBIN (HGB A1C): HEMOGLOBIN A1C: 7.4

## 2016-10-30 MED ORDER — MOMETASONE FUROATE 0.1 % EX CREA: 1.0000 "application " | TOPICAL_CREAM | Freq: Every day | CUTANEOUS | 0 refills | Status: DC

## 2016-10-30 NOTE — Progress Notes (Signed)
Subjective:  Presents for recheck on her diabetes. Blood sugar has been up for the past few weeks. Normally fasting runs in the 80s, now the Lucita Ferrara has been about 158. No changes in her diet. No other changes in her medications. Is completing her previous statin, will be starting Crestor 20 mg since her 90 day supply. No chest pain/ischemic type pain. Has seen increased use of her albuterol for the past 2 weeks, using it 3-4 times per day. Increased cough, nonproductive thick mucus. No fevers. Has been under increased stress at home related to some family issues, states she has no time for herself. Also has a mildly pruritic slightly burning rash in the right axillary area that will come and go. Has been there for while.  Objective:   BP 132/84   Temp 97.6 F (36.4 C) (Oral)   Ht 5\' 1"  (1.549 m)   Wt 158 lb 12.8 oz (72 kg)   SpO2 95%   BMI 30.00 kg/m  NAD. Alert, oriented. TMs retracted, no erythema. There next nonerythematous with cloudy PND noted. Neck supple with mild soft anterior adenopathy. Initially faint wheezes noted throughout lung fields that cleared after coughing. Breath sounds mildly diminished. No tachypnea. Normal color. Heart regular rate rhythm. Very faint slightly pink oval-shaped lesion in the right axillary area approximately 2 cm in diameter with a few tiny pink papules. Nontender to palpation. Results for orders placed or performed in visit on 10/30/16  POCT glycosylated hemoglobin (Hb A1C)  Result Value Ref Range   Hemoglobin A1C 7.4    A1c improved from previous visit.  Assessment: Problem List Items Addressed This Visit      Respiratory   Asthma, chronic (Chronic)    Other Visit Diagnoses    Diabetes mellitus without complication ([place])    -  Primary   Relevant Orders   POCT glycosylated hemoglobin (Hb A1C) (Completed)   Vasomotor rhinitis       Situational anxiety       Fungal dermatitis           Plan:   Meds ordered this encounter  Medications  .  mometasone (ELOCON) 0.1 % cream    Sig: Apply 1 application topically daily.    Dispense:  45 g    Refill:  0    Order Specific Question:   Supervising Provider    Answer:   Mikey Kirschner [[id number]]   Combined with ketoconazole cream for rash under her breast and right arm. Continue Advair and medications for rhinitis. Call back by the end of the week if no improvement. Given samples of Mucinex DM to help thin out mucus. Increase Lantus to 42 units, check sugar and call back with results. Defers daily medication at this time for her anxiety, this appears to be the major factor in her elevated sugars. Return in about 3 months (around 01/30/2017) for recheck. Call back sooner if needed.

## 2016-10-30 NOTE — Patient Instructions (Signed)
Increase Lantus to 42 units daily.

## 2016-11-08 ENCOUNTER — Other Ambulatory Visit: Payer: Self-pay | Admitting: Family Medicine

## 2016-12-09 ENCOUNTER — Other Ambulatory Visit: Payer: Self-pay | Admitting: Nurse Practitioner

## 2017-01-18 ENCOUNTER — Other Ambulatory Visit: Payer: Self-pay | Admitting: *Deleted

## 2017-01-18 ENCOUNTER — Other Ambulatory Visit: Payer: Self-pay | Admitting: Family Medicine

## 2017-01-18 MED ORDER — INSULIN GLARGINE 100 UNIT/ML SOLOSTAR PEN: PEN_INJECTOR | SUBCUTANEOUS | 2 refills | Status: DC

## 2017-01-20 ENCOUNTER — Other Ambulatory Visit: Payer: Self-pay | Admitting: Family Medicine

## 2017-01-31 ENCOUNTER — Ambulatory Visit (INDEPENDENT_AMBULATORY_CARE_PROVIDER_SITE_OTHER): Payer: Medicare Other | Admitting: Nurse Practitioner

## 2017-01-31 ENCOUNTER — Encounter: Payer: Self-pay | Admitting: Nurse Practitioner

## 2017-01-31 VITALS — BP 148/80 | Temp 97.8°F | Ht 61.0 in | Wt 160.0 lb

## 2017-01-31 DIAGNOSIS — E119 Type 2 diabetes mellitus without complications: Secondary | ICD-10-CM | POA: Diagnosis not present

## 2017-01-31 DIAGNOSIS — Z794 Long term (current) use of insulin: Secondary | ICD-10-CM | POA: Diagnosis not present

## 2017-01-31 DIAGNOSIS — Z79899 Other long term (current) drug therapy: Secondary | ICD-10-CM

## 2017-01-31 DIAGNOSIS — E1140 Type 2 diabetes mellitus with diabetic neuropathy, unspecified: Secondary | ICD-10-CM

## 2017-01-31 DIAGNOSIS — E039 Hypothyroidism, unspecified: Secondary | ICD-10-CM | POA: Diagnosis not present

## 2017-01-31 DIAGNOSIS — J4521 Mild intermittent asthma with (acute) exacerbation: Secondary | ICD-10-CM | POA: Diagnosis not present

## 2017-01-31 DIAGNOSIS — E785 Hyperlipidemia, unspecified: Secondary | ICD-10-CM

## 2017-01-31 DIAGNOSIS — B9689 Other specified bacterial agents as the cause of diseases classified elsewhere: Secondary | ICD-10-CM | POA: Diagnosis not present

## 2017-01-31 DIAGNOSIS — J019 Acute sinusitis, unspecified: Secondary | ICD-10-CM | POA: Diagnosis not present

## 2017-01-31 DIAGNOSIS — I10 Essential (primary) hypertension: Secondary | ICD-10-CM

## 2017-01-31 LAB — POCT GLYCOSYLATED HEMOGLOBIN (HGB A1C): HEMOGLOBIN A1C: 7.5

## 2017-01-31 MED ORDER — PREDNISONE 20 MG PO TABS: ORAL_TABLET | ORAL | 0 refills | Status: DC

## 2017-01-31 MED ORDER — AMOXICILLIN-POT CLAVULANATE 875-125 MG PO TABS: 1.0000 | ORAL_TABLET | Freq: Two times a day (BID) | ORAL | 0 refills | Status: DC

## 2017-01-31 MED ORDER — LISINOPRIL 5 MG PO TABS: 5.0000 mg | ORAL_TABLET | Freq: Every day | ORAL | 1 refills | Status: DC

## 2017-01-31 NOTE — Progress Notes (Signed)
Subjective:    Patient ID: Maria [name], female    DOB: April 28, 1955, 61 y.o.   MRN: [id number]  HPI: 61 y/o female presents today for f/u with DM, HLD, and hypothyroidism. Pt reports she is taking her metformin and glipizide as prescribed with no complications. Pt reports that her lantus was changed to 42u q HS with last visit, but during the first week she woke 2-3 times feeling that her blood sugar was low. She states she did not check her blood sugars at that time, but recognized the symptoms and immediately ate something. She states changed the dosage back to the 38u she had previously been on and reports her blood sugars were then running in the 130's -140's on average until she became ill at the beginning of November. At that time she reports that her blood sugars were "all over the place" with some mornings readings as high as 300. She reports she has been changing her dosing between the 38u and 42u since being sick to keep her blood sugar within normal range. Pt reports when her blood sugar falls below 130 she feels dizzy, nauseous, and sweaty. She reports a low carbohydrate diet. Pt states she has been taking her rosuvastatin daily as prescribed and is tolerating this medication well. She denies any known side effects. She reports taking her synthroid daily prior to meals and is tolerating this well. She denies weight gain or loss, decreased appetite, hot or cold intolerance, muscle pain or weakness, palpitations, or edema. Pt reports she is doing daily foot exams and wearing well supportive shoes. She reports occasional (reported less than once per week) "tingling" in the right foot when walking or sitting for long periods of time. She denies numbness or loss of sensation to feet or hands bilaterally.  Sinusitis- Pt reports symptoms beginning appx 2 weeks ago of cough, congestion, and shortness of breath requiring albuterol rescue inhaler use 3-4x/d with symptoms worse at night. Pt took Mucinex and  reports this helped with congestion. Symptoms improved, but 3 days ago pt reports return of symptoms with frontal sinus pain, sinus headache 6/10, cough with thick mucous unable to cough up, sinus and chest congestion, "scratchy" throat, and clear rhinorrhea. Pt states symptoms are worse in the night and the morning. Pt states that her granddaughter was recently diagnosed with croup. Pt reports increased use of albuterol inhaler to 3-4 times per day with last use this morning at 6:30am. She reports she is using her Advair inhaler twice per day as prescribed. She denies fever or chills. She has continued to take mucinex, acetaminophen, and Nyquil with minimal relief.     Review of Systems  Constitutional: Positive for fatigue. Negative for appetite change, chills and fever.  HENT: Positive for congestion, postnasal drip, rhinorrhea, sinus pressure, sinus pain, sore throat and voice change. Negative for ear discharge, ear pain and hearing loss.   Respiratory: Positive for cough, shortness of breath and wheezing. Negative for chest tightness.   Cardiovascular: Negative for chest pain and palpitations.  Gastrointestinal: Negative for nausea and vomiting.  Musculoskeletal: Positive for myalgias.  Skin: Negative for rash.  Neurological: Positive for weakness and headaches.  Psychiatric/Behavioral: Negative for sleep disturbance. The patient is not nervous/anxious.        Objective:   Physical Exam  Constitutional: She is oriented to person, place, and time. She appears well-developed and well-nourished.  HENT:  Head: Normocephalic.  Right Ear: Tympanic membrane is scarred and retracted.  Left Ear: Tympanic  membrane is retracted. A middle ear effusion is present.  Nose: Mucosal edema and rhinorrhea present. Right sinus exhibits frontal sinus tenderness. Right sinus exhibits no maxillary sinus tenderness. Left sinus exhibits frontal sinus tenderness. Left sinus exhibits no maxillary sinus tenderness.    Mouth/Throat: Uvula is midline and mucous membranes are normal. Posterior oropharyngeal erythema present. No oropharyngeal exudate.  Eyes: Pupils are equal, round, and reactive to light.  Neck: Normal range of motion. No thyromegaly present.  Cardiovascular: Normal rate, regular rhythm and normal heart sounds.  Pulmonary/Chest: Effort normal. No accessory muscle usage. No respiratory distress. She has wheezes in the left lower field.  Lymphadenopathy:    She has cervical adenopathy.  Neurological: She is alert and oriented to person, place, and time.  Skin: Skin is warm and dry.  Psychiatric: She has a normal mood and affect. Her behavior is normal.  Vitals reviewed.  Vitals:   01/31/17 0856  BP: (!) 148/80  Temp: 97.8 F (36.6 C)  TempSrc: Oral  Weight: 160 lb (72.6 kg)  Height: 5\' 1"  (1.549 m)   Results for orders placed or performed in visit on 01/31/17  POCT glycosylated hemoglobin (Hb A1C)  Result Value Ref Range   Hemoglobin A1C 7.5    . Diabetic Foot Exam - Simple   Simple Foot Form Diabetic Foot exam was performed with the following findings:  Yes 01/31/2017  9:10 AM  Visual Inspection No deformities, no ulcerations, no other skin breakdown bilaterally:  Yes Sensation Testing Intact to touch and monofilament testing bilaterally:  Yes Pulse Check Posterior Tibialis and Dorsalis pulse intact bilaterally:  Yes Comments       Assessment & Plan:  1. Diabetes mellitus without complication ([place]) - POCT glycosylated hemoglobin (Hb A1C) - Pt educated to continue lantus as prescribed- she was instructed to alter dosage as she has been while ill to meet her needs. - Pt instructed to continue monitoring blood sugar and maintaining a low carbohydrate, low sodium diet - Diabetic foot exam completed  2. Acute bacterial sinusitis - Amoxicillin/Clavulanate ordered - pt instructed to complete full course of antibiotics - Pt educated on continuing Mucinex as needed and  drinking plenty of fluids - Pt instructed to contact the office if her symptoms worsen or fail to improve   3. Mild intermittent chronic asthma with acute exacerbation - Written prescription for prednisone taper given to patient with instructions to fill if breathing symptoms worsen - Continue Advair BID and Albuterol inhaler PRN - Pt instructed to notify the office if she uses the prednisone taper and/or if symptoms worsen or fail to improve  4. Hypothyroidism, unspecified type - TSH ordered  5. Type 2 diabetes mellitus with diabetic neuropathy, with long-term current use of insulin (HCC) - Microalbumin / creatinine urine ratio ordered  6. Hyperlipidemia with target LDL less than 100 - Lipid panel ordered  7. High risk medication use - Hepatic function panel - Basic metabolic panel - Pt instructed to continue Xanax qHS as needed for anxiety and sleep  8. Essential hypertension - Lisinopril ordered - Pt instructed to take the medication at the same time every day - Pt instructed to discontinue use and contact the office if dizziness or low blood pressure occurs  Meds ordered this encounter  Medications  . amoxicillin-clavulanate (AUGMENTIN) 875-125 MG tablet    Sig: Take 1 tablet 2 (two) times daily by mouth.    Dispense:  20 tablet    Refill:  0    Order Specific  Question:   Supervising Provider    Answer:   Mikey Kirschner [[id number]]  . predniSONE (DELTASONE) 20 MG tablet    Sig: Take 3 tabs PO qd for 2 days, then 2 tabs PO qd for 2 days, then 1 tab PO qd for 2 days.    Dispense:  12 tablet    Refill:  0    Order Specific Question:   Supervising Provider    Answer:   Mikey Kirschner [[id number]]  . lisinopril (PRINIVIL,ZESTRIL) 5 MG tablet    Sig: Take 1 tablet (5 mg total) daily by mouth. For blood pressure    Dispense:  90 tablet    Refill:  1    Order Specific Question:   Supervising Provider    Answer:   Mikey Kirschner [[id number]]   Return in about 3 months (around  05/03/2017), or if symptoms worsen or fail to improve, for Diabetes, HTN, HLD, Thyroid.

## 2017-01-31 NOTE — Patient Instructions (Signed)
We will start you on a blood pressure medicine to help bring your blood pressure down just a little bit and to help protect your kidneys. With diabetes you have an increased risk of kidney damage- this medicine will help protect them.

## 2017-02-01 LAB — HEPATIC FUNCTION PANEL
ALBUMIN: 4.3 g/dL (ref 3.6–4.8)
ALT: 21 IU/L (ref 0–32)
AST: 25 IU/L (ref 0–40)
Alkaline Phosphatase: 121 IU/L — ABNORMAL HIGH (ref 39–117)
BILIRUBIN TOTAL: 0.7 mg/dL (ref 0.0–1.2)
BILIRUBIN, DIRECT: 0.2 mg/dL (ref 0.00–0.40)
Total Protein: 6.9 g/dL (ref 6.0–8.5)

## 2017-02-01 LAB — MICROALBUMIN / CREATININE URINE RATIO
Creatinine, Urine: 95.5 mg/dL
MICROALB/CREAT RATIO: 28.4 mg/g{creat} (ref 0.0–30.0)
MICROALBUM., U, RANDOM: 27.1 ug/mL

## 2017-02-01 LAB — BASIC METABOLIC PANEL
BUN / CREAT RATIO: 19 (ref 12–28)
BUN: 13 mg/dL (ref 8–27)
CALCIUM: 9.3 mg/dL (ref 8.7–10.3)
CHLORIDE: 99 mmol/L (ref 96–106)
CO2: 26 mmol/L (ref 20–29)
Creatinine, Ser: 0.69 mg/dL (ref 0.57–1.00)
GFR, EST AFRICAN AMERICAN: 109 mL/min/{1.73_m2} (ref >59)
GFR, EST NON AFRICAN AMERICAN: 95 mL/min/{1.73_m2} (ref >59)
Glucose: 102 mg/dL — ABNORMAL HIGH (ref 65–99)
POTASSIUM: 4.2 mmol/L (ref 3.5–5.2)
Sodium: 140 mmol/L (ref 134–144)

## 2017-02-01 LAB — LIPID PANEL
CHOLESTEROL TOTAL: 165 mg/dL (ref 100–199)
Chol/HDL Ratio: 4.5 ratio — ABNORMAL HIGH (ref 0.0–4.4)
HDL: 37 mg/dL — ABNORMAL LOW (ref >39)
LDL Calculated: 86 mg/dL (ref 0–99)
TRIGLYCERIDES: 212 mg/dL — AB (ref 0–149)
VLDL Cholesterol Cal: 42 mg/dL — ABNORMAL HIGH (ref 5–40)

## 2017-02-01 LAB — TSH: TSH: 0.945 u[IU]/mL (ref 0.450–4.500)

## 2017-02-06 ENCOUNTER — Encounter: Payer: Self-pay | Admitting: Nurse Practitioner

## 2017-02-21 ENCOUNTER — Other Ambulatory Visit: Payer: Self-pay | Admitting: Family Medicine

## 2017-02-21 NOTE — Telephone Encounter (Signed)
May have this +2 refills 

## 2017-03-22 ENCOUNTER — Other Ambulatory Visit: Payer: Self-pay | Admitting: Family Medicine

## 2017-04-08 ENCOUNTER — Emergency Department (HOSPITAL_COMMUNITY)
Admission: EM | Admit: 2017-04-08 | Discharge: 2017-04-08 | Disposition: A | Discharge status: Home / Self Care | Payer: Medicare Other | Attending: Emergency Medicine | Admitting: Emergency Medicine

## 2017-04-08 ENCOUNTER — Encounter (HOSPITAL_COMMUNITY): Payer: Self-pay | Admitting: Emergency Medicine

## 2017-04-08 ENCOUNTER — Emergency Department (HOSPITAL_COMMUNITY): Payer: Medicare Other

## 2017-04-08 ENCOUNTER — Other Ambulatory Visit: Payer: Self-pay

## 2017-04-08 DIAGNOSIS — Y92013 Bedroom of single-family (private) house as the place of occurrence of the external cause: Secondary | ICD-10-CM | POA: Diagnosis not present

## 2017-04-08 DIAGNOSIS — S93601A Unspecified sprain of right foot, initial encounter: Secondary | ICD-10-CM

## 2017-04-08 DIAGNOSIS — Z794 Long term (current) use of insulin: Secondary | ICD-10-CM | POA: Insufficient documentation

## 2017-04-08 DIAGNOSIS — M79671 Pain in right foot: Secondary | ICD-10-CM | POA: Diagnosis not present

## 2017-04-08 DIAGNOSIS — Y9384 Activity, sleeping: Secondary | ICD-10-CM | POA: Insufficient documentation

## 2017-04-08 DIAGNOSIS — S99921A Unspecified injury of right foot, initial encounter: Secondary | ICD-10-CM | POA: Diagnosis not present

## 2017-04-08 DIAGNOSIS — E1143 Type 2 diabetes mellitus with diabetic autonomic (poly)neuropathy: Secondary | ICD-10-CM | POA: Diagnosis not present

## 2017-04-08 DIAGNOSIS — Y999 Unspecified external cause status: Secondary | ICD-10-CM | POA: Diagnosis not present

## 2017-04-08 DIAGNOSIS — W06XXXA Fall from bed, initial encounter: Secondary | ICD-10-CM | POA: Diagnosis not present

## 2017-04-08 DIAGNOSIS — J45909 Unspecified asthma, uncomplicated: Secondary | ICD-10-CM | POA: Insufficient documentation

## 2017-04-08 DIAGNOSIS — Z87891 Personal history of nicotine dependence: Secondary | ICD-10-CM | POA: Insufficient documentation

## 2017-04-08 DIAGNOSIS — E039 Hypothyroidism, unspecified: Secondary | ICD-10-CM | POA: Insufficient documentation

## 2017-04-08 DIAGNOSIS — S93691A Other sprain of right foot, initial encounter: Secondary | ICD-10-CM | POA: Diagnosis not present

## 2017-04-08 DIAGNOSIS — Z79899 Other long term (current) drug therapy: Secondary | ICD-10-CM | POA: Diagnosis not present

## 2017-04-08 NOTE — ED Triage Notes (Signed)
Patient states she stood up quickly when she developed a cramp in her L leg. Patient fell and injured her R foot. Edema noted. Pulse and cap refill intact.

## 2017-04-08 NOTE — ED Provider Notes (Signed)
[facility] EMERGENCY DEPARTMENT Provider Note   CSN: [id number] Arrival date & time: 04/08/17  1625     History   Chief Complaint Chief Complaint  Patient presents with  . Fall    HPI Maria [name] is a 62 y.o. female.  HPI   62 year old female with history of diabetes, vitamin D deficiency and neuropathy presenting for evaluation of foot injury.  Patient reports she was in bed sleeping when she developed a cramp on her left leg.  She has this problem in the past and usually if she stands up the current will go away.  States she jumped up to stand when she lost balance and twisted her right foot causing her to fall onto the ground.  Incident happened approximately 4 hours ago.  She report acute onset of sharp throbbing pain to her right foot with difficulty bearing weight.  Pain severe causing her to feel nauseous.  She denies any specific treatment tried.  No complaints of ankle or knee pain or numbness.  No chest pain or trouble breathing.  She has had prior hairline fracture in the affected foot the past.  Past Medical History:  Diagnosis Date  . Allergy   . Asthma   . Cancer (HCC)    cervical  . CTS (carpal tunnel syndrome)   . Diabetes mellitus without complication ([place])   . GERD (gastroesophageal reflux disease)   . Glaucoma   . Hyperlipidemia   . Hypothyroidism   . Neuropathy   . Vitamin D deficiency     Patient Active Problem List   Diagnosis Date Noted  . Diabetic gastroparesis associated with type 2 diabetes mellitus ([place]) 04/19/2016  . Insomnia 07/14/2015  . Encounter for long-term opiate analgesic use 10/02/2014  . Vitamin D deficiency 06/29/2014  . GERD (gastroesophageal reflux disease) 03/24/2013  . Hyperlipidemia with target LDL less than 100 12/13/2012  . Type 2 diabetes mellitus with diabetic neuropathy ([place]) 06/06/2012  . Hypothyroidism 06/06/2012  . Asthma, chronic 06/06/2012  . Glaucoma 06/06/2012    Past Surgical History:  Procedure  Laterality Date  . ABDOMINAL HYSTERECTOMY    . CARDIAC CATHETERIZATION  2006   negative  . COLONOSCOPY  2009   negative  . INNER EAR SURGERY Right 1981  . left shoulder surgery Left 10/2009    OB History    Gravida Para Term Preterm AB Living   2 2 2          SAB TAB Ectopic Multiple Live Births                   Home Medications    Prior to Admission medications   Medication Sig Start Date End Date Taking? Authorizing Provider  ADVAIR DISKUS 500-50 MCG/DOSE AEPB 1 PUFF TWICE DAILY. RINSE MOUTH WITH WATER AFTER EACH USE. 01/18/17   Mikey Kirschner, MD  albuterol (PROVENTIL) (2.5 MG/3ML) 0.083% nebulizer solution USE 1 VIAL IN NEBULIZER EVERY 4 HOURS AS NEEDED FOR WHEEZING 03/24/16   Mikey Kirschner, MD  ALPRAZolam Duanne Moron) 1 MG tablet TAKE 1 TABLET AT BEDTIME AS NEEDED FOR SLEEP 02/22/17   Kathyrn Drown, MD  amoxicillin-clavulanate (AUGMENTIN) 875-125 MG tablet Take 1 tablet 2 (two) times daily by mouth. 01/31/17   Nilda Simmer, NP  cyclobenzaprine (FLEXERIL) 10 MG tablet Take 1 tablet (10 mg total) by mouth 3 (three) times daily as needed for muscle spasms. 03/24/16   Mikey Kirschner, MD  glipiZIDE (GLIPIZIDE XL) 10 MG 24 hr tablet  TAKE 2 TABLETS DAILY AS DIRECTED 03/24/16   Mikey Kirschner, MD  HYDROcodone-acetaminophen [facility]) 10-325 MG tablet Take 1 tablet by mouth every 8 (eight) hours as needed. 01/13/16   Nilda Simmer, NP  Insulin Glargine (LANTUS SOLOSTAR) 100 UNIT/ML Solostar Pen INJECT 42 UNITS SQ EVERY EVENING FOR DIABETES 01/18/17   Mikey Kirschner, MD  ketoconazole (NIZORAL) 2 % cream Apply topically 2 (two) times daily. Patient taking differently: Apply 1 application topically 2 (two) times daily as needed for irritation.  11/05/12   Kathyrn Drown, MD  LEADER INSULIN SYRINGE 31G X 5/16" 0.5 ML MISC  01/24/13   [provider]  levothyroxine (SYNTHROID, LEVOTHROID) 75 MCG tablet TAKE 1 TABLET DAILY 12/11/16   Nilda Simmer, NP  lisinopril  (PRINIVIL,ZESTRIL) 5 MG tablet Take 1 tablet (5 mg total) daily by mouth. For blood pressure 01/31/17   Pearson Forster C, NP  LUMIGAN 0.01 % SOLN  06/16/16   [provider]  metFORMIN (GLUCOPHAGE) 500 MG tablet TAKE 2 TABLETS 2 TIMES A DAY WITH MEALS 08/18/16   Mikey Kirschner, MD  mometasone (ELOCON) 0.1 % cream Apply 1 application topically daily. 10/30/16   Nilda Simmer, NP  montelukast (SINGULAIR) 10 MG tablet TAKE 1 TABLET DAILY 12/11/16   Nilda Simmer, NP  omeprazole (PRILOSEC) 40 MG capsule Take 1 capsule (40 mg total) by mouth daily. 01/22/17   Mikey Kirschner, MD  ondansetron (ZOFRAN ODT) 4 MG disintegrating tablet Take 1 tablet (4 mg total) by mouth every 6 (six) hours as needed for nausea or vomiting. 04/17/16   Nilda Simmer, NP  predniSONE (DELTASONE) 20 MG tablet Take 3 tabs PO qd for 2 days, then 2 tabs PO qd for 2 days, then 1 tab PO qd for 2 days. 01/31/17   Nilda Simmer, NP  PROAIR HFA 108 [phone] Base) MCG/ACT inhaler Inhale 2 puffs into the lungs every 6 (six) hours as needed for wheezi ng or shortness of breath. 03/22/17   Mikey Kirschner, MD  rosuvastatin (CRESTOR) 20 MG tablet Take 1 tablet (20 mg total) by mouth daily. 07/18/16   Nilda Simmer, NP  triamcinolone cream (KENALOG) 0.1 % APPLY TO AFFECTED AREAS 2 TIMES A DAY AS NEEDED 03/24/16   Mikey Kirschner, MD  ULTICARE SHORT PEN NEEDLES 31G X 8 MM MISC USE AS DIRECTED 11/08/16   Mikey Kirschner, MD  ZOVIRAX 5 % APPLY 5 TIMES A DAY AS NEEDED FOR FEVER BLISTERS 06/05/16   Kathyrn Drown, MD    Family History Family History  Problem Relation Age of Onset  . Diabetes Mother   . Prostate cancer Father   . Heart disease Father   . Heart attack Other     Social History Social History   Tobacco Use  . Smoking status: Former Smoker    Packs/day: 1.00    Years: 15.00    Pack years: 15.00    Types: Cigarettes    Last attempt to quit: 06/05/2001    Years since quitting: 15.8  . Smokeless  tobacco: Never Used  Substance Use Topics  . Alcohol use: No  . Drug use: No     Allergies   Patient has no known allergies.   Review of Systems Review of Systems  Constitutional: Negative for fever.  Musculoskeletal: Positive for arthralgias.  Skin: Negative for wound.  Neurological: Negative for numbness.     Physical Exam Updated Vital Signs BP 112/68 (BP  Location: Right Arm)   Pulse (!) 102   Temp 97.6 F (36.4 C) (Oral)   Resp 16   Ht 5\' 1"  (1.549 m)   Wt 72.6 kg (160 lb)   SpO2 95%   BMI 30.23 kg/m   Physical Exam  Constitutional: She appears well-developed and well-nourished. No distress.  HENT:  Head: Atraumatic.  Eyes: Conjunctivae are normal.  Neck: Neck supple.  Musculoskeletal: She exhibits tenderness (Right foot: Moderate edema and ecchymosis noted to the dorsum of foot, tenderness to palpation without any crepitus.  Dorsalis pedis pulse palpable, brisk cap refill.).  Right ankle is nontender to palpation.  Right knee is nontender.  Neurological: She is alert.  Skin: No rash noted.  Psychiatric: She has a normal mood and affect.  Nursing note and vitals reviewed.    ED Treatments / Results  Labs (all labs ordered are listed, but only abnormal results are displayed) Labs Reviewed - No data to display  EKG  EKG Interpretation None       Radiology Dg Foot Complete Right  Result Date: 04/08/2017 CLINICAL DATA:  Fall.  Foot injury EXAM: RIGHT FOOT COMPLETE - 3+ VIEW COMPARISON:  None. FINDINGS: There is no evidence of fracture or dislocation. There is no evidence of arthropathy or other focal bone abnormality. Soft tissues are unremarkable. IMPRESSION: Negative. Electronically Signed   By: Franchot Gallo M.D.   On: 04/08/2017 17:19    Procedures Procedures (including critical care time)  Medications Ordered in ED Medications - No data to display   Initial Impression / Assessment and Plan / ED Course  I have reviewed the triage vital  signs and the nursing notes.  Pertinent labs & imaging results that were available during my care of the patient were reviewed by me and considered in my medical decision making (see chart for details).     BP 112/68 (BP Location: Right Arm)   Pulse (!) 102   Temp 97.6 F (36.4 C) (Oral)   Resp 16   Ht 5\' 1"  (1.549 m)   Wt 72.6 kg (160 lb)   SpO2 95%   BMI 30.23 kg/m    Final Clinical Impressions(s) / ED Diagnoses   Final diagnoses:  Right foot sprain, initial encounter    ED Discharge Orders    None     4:50 PM Patient with mechanical injury injuring her right foot.  X-ray order pain medication offered but patient declined.  5:26 PM Xray of R foot is negative for acute fx or dislocation.  RICE therapy discussed.  ACE wrap provided.  Return precaution given. Postop shoe provided as requested.   Domenic Moras, PA-C 04/08/17 1729    Pattricia Boss, MD 04/08/17 [phone]

## 2017-04-09 ENCOUNTER — Other Ambulatory Visit: Payer: Self-pay

## 2017-04-26 ENCOUNTER — Other Ambulatory Visit: Payer: Self-pay | Admitting: Nurse Practitioner

## 2017-04-26 ENCOUNTER — Other Ambulatory Visit: Payer: Self-pay | Admitting: Family Medicine

## 2017-05-03 ENCOUNTER — Ambulatory Visit: Payer: Medicare Other | Admitting: Nurse Practitioner

## 2017-05-04 ENCOUNTER — Encounter: Payer: Self-pay | Admitting: Nurse Practitioner

## 2017-05-04 ENCOUNTER — Ambulatory Visit (INDEPENDENT_AMBULATORY_CARE_PROVIDER_SITE_OTHER): Payer: Medicare Other | Admitting: Nurse Practitioner

## 2017-05-04 VITALS — BP 136/86 | Ht 61.0 in | Wt 154.6 lb

## 2017-05-04 DIAGNOSIS — E1140 Type 2 diabetes mellitus with diabetic neuropathy, unspecified: Secondary | ICD-10-CM

## 2017-05-04 DIAGNOSIS — M25571 Pain in right ankle and joints of right foot: Secondary | ICD-10-CM | POA: Diagnosis not present

## 2017-05-04 DIAGNOSIS — W010XXD Fall on same level from slipping, tripping and stumbling without subsequent striking against object, subsequent encounter: Secondary | ICD-10-CM | POA: Diagnosis not present

## 2017-05-04 DIAGNOSIS — J4520 Mild intermittent asthma, uncomplicated: Secondary | ICD-10-CM

## 2017-05-04 DIAGNOSIS — S93401A Sprain of unspecified ligament of right ankle, initial encounter: Secondary | ICD-10-CM

## 2017-05-04 DIAGNOSIS — Z794 Long term (current) use of insulin: Secondary | ICD-10-CM | POA: Diagnosis not present

## 2017-05-04 DIAGNOSIS — S93401D Sprain of unspecified ligament of right ankle, subsequent encounter: Secondary | ICD-10-CM

## 2017-05-04 DIAGNOSIS — M79671 Pain in right foot: Secondary | ICD-10-CM | POA: Diagnosis not present

## 2017-05-04 LAB — POCT GLYCOSYLATED HEMOGLOBIN (HGB A1C): HEMOGLOBIN A1C: 6.9

## 2017-05-04 NOTE — Progress Notes (Signed)
Subjective:    Patient ID: Maria [name], female    DOB: 1955-12-21, 62 y.o.   MRN: [id number]  HPI Patient presents today for 3 month check up. Patient states that about 3 weeks ago she woke up with a cramp in her L leg in the middle of the night and jumped out of bed. Patient states that when she got out of bed, she felt a severe, sharp pain in her ankle and woke up moments later lying on the floor. Pt denies shakiness or sweating during that episode. Pt also denies numbness, tingling, difficulty speaking or swallowing during that episode. Additionally, patient did not experience any light-headedness or dizziness prior to awakening on the floor. Patient reports minimal improvement in pain, swelling, and bruising to right ankle. Pt went to ER where xray did not show fracture and sprain was diagnosed. Pt has been taking tylenol and ibuprofen for pain at home but right foot continues to be painful. Patient able to bear weight on R foot. Pt reports a previous hair line fracture in right ankle 10-15 years ago.   Since the injury, patient has experienced a decrease in appetite and waking up in the middle of the night feeling shaky and sweating. Pt has not checked fsbs during these episodes but reports they are similar to previous hypoglycemic events.   Patient reports experiencing congestion and cough a few weeks ago. She states she uses her maintenance inhaler as prescribed but during this congestion, she was using her albuterol 5-6 times per day. Pt was also taking mucinex with some relief from the congestion. Currently, patient denies sob, wheezing, or sputum production. Pt states she is using her albuterol 1-2 times per week.   Past Medical History:  Diagnosis Date  . Allergy   . Asthma   . Cancer (HCC)    cervical  . CTS (carpal tunnel syndrome)   . Diabetes mellitus without complication ([place])   . GERD (gastroesophageal reflux disease)   . Glaucoma   . Hyperlipidemia   . Hypothyroidism   .  Neuropathy   . Vitamin D deficiency    Past Surgical History:  Procedure Laterality Date  . ABDOMINAL HYSTERECTOMY    . CARDIAC CATHETERIZATION  2006   negative  . COLONOSCOPY  2009   negative  . INNER EAR SURGERY Right 1981  . left shoulder surgery Left 10/2009   Family History  Problem Relation Age of Onset  . Diabetes Mother   . Prostate cancer Father   . Heart disease Father   . Heart attack Other    Social History   Tobacco Use  . Smoking status: Former Smoker    Packs/day: 1.00    Years: 15.00    Pack years: 15.00    Types: Cigarettes    Last attempt to quit: 06/05/2001    Years since quitting: 15.9  . Smokeless tobacco: Never Used  Substance Use Topics  . Alcohol use: No  . Drug use: No   Sexual Health: Patient is married with 2 children  Occupation: Pt is retired Financial controller: Pt lives with adult son, grandchild, and husband Caffeine: 2 cups of coffee per day Diet: Usually two meals per day. Typical breakfast includes eggs, sausage, and toast. Dinner is usually a vegetable and a meat.  Exercise: Patient stays active by playing outside with grandchild  No Known Allergies  Current Meds  Medication Sig  . ADVAIR DISKUS 500-50 MCG/DOSE AEPB 1 PUFF TWICE DAILY. RINSE MOUTH WITH WATER AFTER  EACH USE.  Marland Kitchen albuterol (PROVENTIL) (2.5 MG/3ML) 0.083% nebulizer solution USE 1 VIAL IN NEBULIZER EVERY 4 HOURS AS NEEDED FOR WHEEZING  . ALPRAZolam (XANAX) 1 MG tablet TAKE 1 TABLET AT BEDTIME AS NEEDED FOR SLEEP  . amoxicillin-clavulanate (AUGMENTIN) 875-125 MG tablet Take 1 tablet 2 (two) times daily by mouth.  . cyclobenzaprine (FLEXERIL) 10 MG tablet Take 1 tablet (10 mg total) by mouth 3 (three) times daily as needed for muscle spasms.  Marland Kitchen glipiZIDE (GLIPIZIDE XL) 10 MG 24 hr tablet TAKE 2 TABLETS DAILY AS DIRECTED  . HYDROcodone-acetaminophen (NORCO) 10-325 MG tablet Take 1 tablet by mouth every 8 (eight) hours as needed.  . Insulin Glargine (LANTUS SOLOSTAR) 100  UNIT/ML Solostar Pen INJECT 42 UNITS SQ EVERY EVENING FOR DIABETES  . ketoconazole (NIZORAL) 2 % cream Apply topically 2 (two) times daily. (Patient taking differently: Apply 1 application topically 2 (two) times daily as needed for irritation. )  . LEADER INSULIN SYRINGE 31G X 5/16" 0.5 ML MISC   . levothyroxine (SYNTHROID, LEVOTHROID) 75 MCG tablet TAKE 1 TABLET DAILY  . lisinopril (PRINIVIL,ZESTRIL) 5 MG tablet Take 1 tablet (5 mg total) daily by mouth. For blood pressure  . LUMIGAN 0.01 % SOLN   . metFORMIN (GLUCOPHAGE) 500 MG tablet TAKE 2 TABLETS 2 TIMES A DAY WITH MEALS  . mometasone (ELOCON) 0.1 % cream Apply 1 application topically daily.  . montelukast (SINGULAIR) 10 MG tablet TAKE 1 TABLET DAILY  . omeprazole (PRILOSEC) 40 MG capsule Take 1 capsule (40 mg total) by mouth daily.  . ondansetron (ZOFRAN ODT) 4 MG disintegrating tablet Take 1 tablet (4 mg total) by mouth every 6 (six) hours as needed for nausea or vomiting.  . predniSONE (DELTASONE) 20 MG tablet Take 3 tabs PO qd for 2 days, then 2 tabs PO qd for 2 days, then 1 tab PO qd for 2 days.  Marland Kitchen PROAIR HFA 108 (90 Base) MCG/ACT inhaler Inhale 2 puffs into the lungs every 6 (six) hours as needed for wheezi ng or shortness of breath.  . rosuvastatin (CRESTOR) 20 MG tablet TAKE 1 TABLET DAILY  . triamcinolone cream (KENALOG) 0.1 % APPLY TO AFFECTED AREAS 2 TIMES A DAY AS NEEDED  . ULTICARE SHORT PEN NEEDLES 31G X 8 MM MISC USE AS DIRECTED  . ZOVIRAX 5 % APPLY 5 TIMES A DAY AS NEEDED FOR FEVER BLISTERS    Review of Systems  Constitutional:       Pt reports decreased appetite and recent weight loss of 6 lbs in less than a month  HENT: Negative for congestion, rhinorrhea, sinus pressure and sore throat.   Respiratory: Negative for chest tightness, shortness of breath and wheezing.   Musculoskeletal:       Pain in RLE, Denies numbness or tingling  Neurological: Negative for dizziness, speech difficulty, light-headedness and numbness.          Objective:   Physical Exam  Constitutional: She appears well-developed and well-nourished.  HENT:  Nose: No mucosal edema or rhinorrhea.  Mouth/Throat: Oropharynx is clear and moist and mucous membranes are normal.  BIL Ears without exudate, erythema, and normal tympanic membranes. No effusion.   Cardiovascular: Normal rate, regular rhythm and normal heart sounds.  Pulmonary/Chest: Breath sounds normal. No respiratory distress.  Wheeze in RLL initially that cleared with cough. No tachypnea noted.   Musculoskeletal:  RLE: Full passive ROM. Extreme tenderness on 5th toe. Moderate ecchymosis laterally.   Lymphadenopathy:    She has no cervical adenopathy.  Vitals:   05/04/17 0917  BP: 136/86  SpO2: 95%   Results for orders placed or performed in visit on 05/04/17  POCT HgB A1C  Result Value Ref Range   Hemoglobin A1C 6.9          Assessment & Plan:   Problem List Items Addressed This Visit      Respiratory   Asthma, chronic (Chronic)     Endocrine   Type 2 diabetes mellitus with diabetic neuropathy (HCC) - Primary (Chronic)   Relevant Orders   POCT HgB A1C (Completed)    Other Visit Diagnoses    Sprain of right ankle, unspecified ligament, initial encounter           Rx: Decrease Lantus to 30 units at bedtime Referral: Refer to ortho for urgent appt to evaluate right ankle  Education: Pt instructed to check bs daily & during episodes of possible hypoglycemia. Report results to office.  Return in about 3 months (around 08/01/2017) for recheck.

## 2017-05-04 NOTE — Patient Instructions (Signed)
Decrease Lantus to 30 Units at bedtime

## 2017-05-24 ENCOUNTER — Other Ambulatory Visit: Payer: Self-pay | Admitting: Family Medicine

## 2017-05-24 NOTE — Telephone Encounter (Signed)
Actually saw Maria [name] for chronic concerns, at that itme she rec re ck in three mo (last mo due) one mo worth all, o v soon

## 2017-06-06 ENCOUNTER — Other Ambulatory Visit: Payer: Self-pay | Admitting: Nurse Practitioner

## 2017-07-17 DIAGNOSIS — H401132 Primary open-angle glaucoma, bilateral, moderate stage: Secondary | ICD-10-CM | POA: Diagnosis not present

## 2017-07-17 DIAGNOSIS — H25812 Combined forms of age-related cataract, left eye: Secondary | ICD-10-CM | POA: Diagnosis not present

## 2017-07-17 DIAGNOSIS — H2511 Age-related nuclear cataract, right eye: Secondary | ICD-10-CM | POA: Diagnosis not present

## 2017-07-18 ENCOUNTER — Encounter: Payer: Medicare Other | Admitting: Nurse Practitioner

## 2017-07-19 ENCOUNTER — Ambulatory Visit (INDEPENDENT_AMBULATORY_CARE_PROVIDER_SITE_OTHER): Payer: Medicare Other | Admitting: Nurse Practitioner

## 2017-07-19 ENCOUNTER — Encounter: Payer: Self-pay | Admitting: Nurse Practitioner

## 2017-07-19 VITALS — BP 110/64 | Ht 61.0 in | Wt 148.0 lb

## 2017-07-19 DIAGNOSIS — F329 Major depressive disorder, single episode, unspecified: Secondary | ICD-10-CM | POA: Insufficient documentation

## 2017-07-19 DIAGNOSIS — F419 Anxiety disorder, unspecified: Secondary | ICD-10-CM | POA: Diagnosis not present

## 2017-07-19 DIAGNOSIS — J4521 Mild intermittent asthma with (acute) exacerbation: Secondary | ICD-10-CM | POA: Diagnosis not present

## 2017-07-19 DIAGNOSIS — E785 Hyperlipidemia, unspecified: Secondary | ICD-10-CM

## 2017-07-19 DIAGNOSIS — Z0000 Encounter for general adult medical examination without abnormal findings: Secondary | ICD-10-CM | POA: Diagnosis not present

## 2017-07-19 DIAGNOSIS — Z1231 Encounter for screening mammogram for malignant neoplasm of breast: Secondary | ICD-10-CM | POA: Diagnosis not present

## 2017-07-19 DIAGNOSIS — Z0001 Encounter for general adult medical examination with abnormal findings: Secondary | ICD-10-CM | POA: Diagnosis not present

## 2017-07-19 DIAGNOSIS — F32A Depression, unspecified: Secondary | ICD-10-CM

## 2017-07-19 MED ORDER — CITALOPRAM HYDROBROMIDE 20 MG PO TABS: ORAL_TABLET | ORAL | 2 refills | Status: DC

## 2017-07-19 MED ORDER — PREDNISONE 20 MG PO TABS
ORAL_TABLET | ORAL | 0 refills | Status: DC
Start: 2017-07-19 — End: 2017-08-31

## 2017-07-19 MED ORDER — PREDNISONE 20 MG PO TABS: ORAL_TABLET | ORAL | 0 refills | Status: DC

## 2017-07-19 NOTE — Progress Notes (Signed)
Subjective:    Patient ID: Maria [name], female    DOB: October 07, 1955, 62 y.o.   MRN: [id number]  HPI Presents for her wellness exam. Had a severe right foot sprain on 04/08/17. Was seen by orthopedics. Placed in a boot for several weeks. Was sent for PT but could not afford copay. Has been doing her own exercises which has helped. States she has "not felt right" since the injury. Has also moved into a new home. Has had some spikes in her sugar during the day for the past 2 months. FBS tends to run well. Decreased appetite. No N/V. No acid reflux. Some diarrhea. No blood. No fever. Taking 30 units of Lantus at bedtime. No pelvic pain. Same sexual partner. Had an eye exam 2 days ago. Has dentures. Has had a flare up of asthma for the past 2 weeks mainly due to pollen. Using her albuterol inhaler up to 5 times per day. Last used right before her visit.     Review of Systems  Constitutional: Positive for appetite change and fatigue. Negative for activity change.  HENT: Negative for ear pain, mouth sores, sinus pressure and sore throat.   Respiratory: Positive for shortness of breath and wheezing. Negative for cough and chest tightness.   Cardiovascular: Negative for chest pain.  Gastrointestinal: Positive for diarrhea. Negative for abdominal distention, abdominal pain, blood in stool, constipation, nausea and vomiting.  Genitourinary: Negative for difficulty urinating, dysuria, enuresis, frequency, genital sores, pelvic pain, urgency and vaginal discharge.   Depression screen [facility] 2/9 07/19/2017 07/19/2017 07/17/2016  Decreased Interest 3 0 0  Down, Depressed, Hopeless 2 0 0  PHQ - 2 Score 5 0 0  Altered sleeping 3 - -  Tired, decreased energy 3 - -  Change in appetite 3 - -  Feeling bad or failure about yourself  2 - -  Trouble concentrating 1 - -  Moving slowly or fidgety/restless 0 - -  Suicidal thoughts 0 - -  PHQ-9 Score 17 - -  Difficult doing work/chores Somewhat difficult - -   GAD 7 :  Generalized Anxiety Score 07/19/2017  Nervous, Anxious, on Edge 3  Control/stop worrying 3  Worry too much - different things 3  Trouble relaxing 3  Restless 2  Easily annoyed or irritable 3  Afraid - awful might happen 1  Total GAD 7 Score 18  Anxiety Difficulty Somewhat difficult         Objective:   Physical Exam  Constitutional: She is oriented to person, place, and time. She appears well-developed. No distress.  HENT:  Right Ear: External ear normal.  Left Ear: External ear normal.  Mouth/Throat: Oropharynx is clear and moist.  Neck: Normal range of motion. Neck supple. No tracheal deviation present. No thyromegaly present.  Cardiovascular: Normal rate and regular rhythm.  Pulmonary/Chest: Effort normal. She has wheezes. Right breast exhibits no inverted nipple, no mass, no skin change and no tenderness. Left breast exhibits no inverted nipple, no mass, no skin change and no tenderness. Breasts are symmetrical.  Faint expiratory wheeze noted mainly right side. No tachypnea. Last used albuterol right before visit.  Axillae no adenopathy.   Abdominal: Soft. She exhibits no distension. There is no tenderness.  Genitourinary: Vagina normal. No vaginal discharge found.  Genitourinary Comments: External GU: no rashes or lesions. Vagina: no discharge. Bimanual exam: no tenderness or obvious masses; exam limited due to abdominal girth.   Musculoskeletal: She exhibits no edema.  Lymphadenopathy:    She  has no cervical adenopathy.  Neurological: She is alert and oriented to person, place, and time.  Skin: Skin is warm and dry. No rash noted.  Psychiatric: She has a normal mood and affect. Her behavior is normal. Thought content normal.          Assessment & Plan:   Problem List Items Addressed This Visit      Respiratory   Asthma, chronic (Chronic)   Relevant Medications   predniSONE (DELTASONE) 20 MG tablet     Other   Anxiety and depression   Relevant Medications    citalopram (CELEXA) 20 MG tablet   Hyperlipidemia with target LDL less than 100 (Chronic)   Relevant Orders   Lipid panel    Other Visit Diagnoses    Routine general medical examination at a health care facility    -  Primary   Relevant Orders   MM 3D SCREEN BREAST BILATERAL   Lipid panel   Hepatic function panel   Encounter for screening mammogram for breast cancer       Relevant Orders   MM 3D SCREEN BREAST BILATERAL   Lipid panel   Hepatic function panel     Meds ordered this encounter  Medications  . DISCONTD: predniSONE (DELTASONE) 20 MG tablet    Sig: Take 3 tabs PO qd for 2 days, then 2 tabs PO qd for 2 days, then 1 tab PO qd for 2 days.    Dispense:  12 tablet    Refill:  0    Order Specific Question:   Supervising Provider    Answer:   Mikey Kirschner [[id number]]  . citalopram (CELEXA) 20 MG tablet    Sig: Take 1/2 tab po qhs x 6 d then one po qhs    Dispense:  30 tablet    Refill:  2    Order Specific Question:   Supervising Provider    Answer:   Mikey Kirschner [[id number]]  . predniSONE (DELTASONE) 20 MG tablet    Sig: Take 3 tabs PO qd for 2 days, then 2 tabs PO qd for 2 days, then 1 tab PO qd for 2 days.    Dispense:  12 tablet    Refill:  0   Reviewed potential adverse effects of Celexa. DC med and call if any problems. Defers referral to podiatry for her foot at this time. Take Lantus insulin in the am. Monitor closely while on Prednisone.  Continue Advair and Singulair as directed. Avoid excessive exposure to pollen. Call back in 4 days if no improvement, go to ED sooner if worse.  Return in about 1 month (around 08/19/2017) for recheck.

## 2017-07-20 LAB — LIPID PANEL
CHOLESTEROL TOTAL: 129 mg/dL (ref 100–199)
Chol/HDL Ratio: 3.1 ratio (ref 0.0–4.4)
HDL: 41 mg/dL (ref >39)
LDL Calculated: 67 mg/dL (ref 0–99)
TRIGLYCERIDES: 107 mg/dL (ref 0–149)
VLDL Cholesterol Cal: 21 mg/dL (ref 5–40)

## 2017-07-20 LAB — HEPATIC FUNCTION PANEL
ALT: 17 IU/L (ref 0–32)
AST: 22 IU/L (ref 0–40)
Albumin: 4.2 g/dL (ref 3.6–4.8)
Alkaline Phosphatase: 111 IU/L (ref 39–117)
BILIRUBIN TOTAL: 0.4 mg/dL (ref 0.0–1.2)
BILIRUBIN, DIRECT: 0.13 mg/dL (ref 0.00–0.40)
Total Protein: 6.5 g/dL (ref 6.0–8.5)

## 2017-07-25 ENCOUNTER — Other Ambulatory Visit: Payer: Self-pay | Admitting: Family Medicine

## 2017-07-25 NOTE — Telephone Encounter (Signed)
This is Dr. Steve's patient thank you 

## 2017-07-30 ENCOUNTER — Other Ambulatory Visit: Payer: Self-pay | Admitting: Nurse Practitioner

## 2017-08-20 ENCOUNTER — Ambulatory Visit: Payer: Medicare Other | Admitting: Nurse Practitioner

## 2017-08-31 ENCOUNTER — Ambulatory Visit (INDEPENDENT_AMBULATORY_CARE_PROVIDER_SITE_OTHER): Payer: Medicare Other | Admitting: Nurse Practitioner

## 2017-08-31 ENCOUNTER — Encounter: Payer: Self-pay | Admitting: Nurse Practitioner

## 2017-08-31 VITALS — BP 110/60 | HR 98 | Ht 62.0 in | Wt 148.6 lb

## 2017-08-31 DIAGNOSIS — F32A Depression, unspecified: Secondary | ICD-10-CM

## 2017-08-31 DIAGNOSIS — Z794 Long term (current) use of insulin: Secondary | ICD-10-CM

## 2017-08-31 DIAGNOSIS — F329 Major depressive disorder, single episode, unspecified: Secondary | ICD-10-CM

## 2017-08-31 DIAGNOSIS — F419 Anxiety disorder, unspecified: Secondary | ICD-10-CM

## 2017-08-31 DIAGNOSIS — E1140 Type 2 diabetes mellitus with diabetic neuropathy, unspecified: Secondary | ICD-10-CM

## 2017-09-01 ENCOUNTER — Encounter: Payer: Self-pay | Admitting: Nurse Practitioner

## 2017-09-01 NOTE — Progress Notes (Signed)
Subjective: Presents for recheck on her anxiety and depression.  See previous notes.  Currently on Celexa 20 mg, taking a half a pill once a day.  Denies any adverse effects.  States she is feeling much better.  "Feels much more like herself".  Appetite has improved.  Recent fasting blood sugars 90 and 114.  Has adjusted her Lantus, now taking 38 units daily which is working well.  States her sugars are much improved and stable.  Objective:   BP 110/60 (BP Location: Right Arm, Patient Position: Sitting)   Pulse 98   Ht 5\' 2"  (1.575 m)   Wt 148 lb 9.6 oz (67.4 kg)   SpO2 95%   BMI 27.18 kg/m  NAD.  Alert, oriented.  Lungs clear.  Heart regular rate and rhythm.  Calm cheerful affect.  Thoughts logical coherent and relevant.  Dressed appropriately.  Making good eye contact.  Assessment:   Problem List Items Addressed This Visit      Endocrine   Type 2 diabetes mellitus with diabetic neuropathy ([place]) - Primary (Chronic)     Other   Anxiety and depression       Plan: Continue Celexa as directed.  Increase to 20 mg over time if needed.  Continue to monitor blood sugars and call back if any problems. Return in about 3 months (around 12/01/2017) for diabetes check up.

## 2017-09-06 ENCOUNTER — Ambulatory Visit (HOSPITAL_COMMUNITY)
Admission: RE | Admit: 2017-09-06 | Discharge: 2017-09-06 | Disposition: A | Discharge status: Home / Self Care | Payer: Medicare Other | Source: Ambulatory Visit | Attending: Nurse Practitioner | Admitting: Nurse Practitioner

## 2017-09-06 ENCOUNTER — Other Ambulatory Visit: Payer: Self-pay | Admitting: Nurse Practitioner

## 2017-09-06 DIAGNOSIS — Z1231 Encounter for screening mammogram for malignant neoplasm of breast: Secondary | ICD-10-CM | POA: Insufficient documentation

## 2017-09-06 DIAGNOSIS — Z0000 Encounter for general adult medical examination without abnormal findings: Secondary | ICD-10-CM | POA: Diagnosis present

## 2017-11-20 ENCOUNTER — Other Ambulatory Visit: Payer: Self-pay | Admitting: Family Medicine

## 2017-11-22 ENCOUNTER — Other Ambulatory Visit: Payer: Self-pay

## 2017-11-22 MED ORDER — ROSUVASTATIN CALCIUM 20 MG PO TABS: 20.0000 mg | ORAL_TABLET | Freq: Every day | ORAL | 1 refills | Status: DC

## 2017-12-03 ENCOUNTER — Encounter: Payer: Self-pay | Admitting: Family Medicine

## 2017-12-03 ENCOUNTER — Ambulatory Visit (INDEPENDENT_AMBULATORY_CARE_PROVIDER_SITE_OTHER): Payer: Medicare Other | Admitting: Family Medicine

## 2017-12-03 VITALS — BP 122/70 | Ht 62.0 in | Wt 156.4 lb

## 2017-12-03 DIAGNOSIS — E039 Hypothyroidism, unspecified: Secondary | ICD-10-CM | POA: Diagnosis not present

## 2017-12-03 DIAGNOSIS — F419 Anxiety disorder, unspecified: Secondary | ICD-10-CM

## 2017-12-03 DIAGNOSIS — Z794 Long term (current) use of insulin: Secondary | ICD-10-CM

## 2017-12-03 DIAGNOSIS — Z23 Encounter for immunization: Secondary | ICD-10-CM | POA: Diagnosis not present

## 2017-12-03 DIAGNOSIS — F329 Major depressive disorder, single episode, unspecified: Secondary | ICD-10-CM

## 2017-12-03 DIAGNOSIS — J4520 Mild intermittent asthma, uncomplicated: Secondary | ICD-10-CM

## 2017-12-03 DIAGNOSIS — F32A Depression, unspecified: Secondary | ICD-10-CM

## 2017-12-03 DIAGNOSIS — E1140 Type 2 diabetes mellitus with diabetic neuropathy, unspecified: Secondary | ICD-10-CM

## 2017-12-03 LAB — POCT GLYCOSYLATED HEMOGLOBIN (HGB A1C): Hemoglobin A1C: 6 % — AB (ref 4.0–5.6)

## 2017-12-03 MED ORDER — ALPRAZOLAM 1 MG PO TABS: 1.0000 mg | ORAL_TABLET | Freq: Every evening | ORAL | 5 refills | Status: DC | PRN

## 2017-12-03 MED ORDER — FLUTICASONE-SALMETEROL 500-50 MCG/DOSE IN AEPB: INHALATION_SPRAY | RESPIRATORY_TRACT | 2 refills | Status: DC

## 2017-12-03 MED ORDER — LEVOTHYROXINE SODIUM 75 MCG PO TABS: 75.0000 ug | ORAL_TABLET | Freq: Every day | ORAL | 1 refills | Status: DC

## 2017-12-03 MED ORDER — OMEPRAZOLE 40 MG PO CPDR: 40.0000 mg | DELAYED_RELEASE_CAPSULE | Freq: Every day | ORAL | 0 refills | Status: DC

## 2017-12-03 MED ORDER — CITALOPRAM HYDROBROMIDE 20 MG PO TABS: ORAL_TABLET | ORAL | 2 refills | Status: DC

## 2017-12-03 MED ORDER — MONTELUKAST SODIUM 10 MG PO TABS: 10.0000 mg | ORAL_TABLET | Freq: Every day | ORAL | 1 refills | Status: DC

## 2017-12-03 MED ORDER — LISINOPRIL 5 MG PO TABS: ORAL_TABLET | ORAL | 1 refills | Status: DC

## 2017-12-03 MED ORDER — ALBUTEROL SULFATE (2.5 MG/3ML) 0.083% IN NEBU: INHALATION_SOLUTION | RESPIRATORY_TRACT | 0 refills | Status: DC

## 2017-12-03 MED ORDER — METFORMIN HCL 500 MG PO TABS: ORAL_TABLET | ORAL | 5 refills | Status: DC

## 2017-12-03 MED ORDER — GLIPIZIDE ER 10 MG PO TB24: ORAL_TABLET | ORAL | 1 refills | Status: DC

## 2017-12-03 MED ORDER — INSULIN GLARGINE 100 UNIT/ML SOLOSTAR PEN: PEN_INJECTOR | SUBCUTANEOUS | 5 refills | Status: DC

## 2017-12-03 MED ORDER — ROSUVASTATIN CALCIUM 20 MG PO TABS: 20.0000 mg | ORAL_TABLET | Freq: Every day | ORAL | 1 refills | Status: DC

## 2017-12-03 NOTE — Progress Notes (Signed)
   Subjective:    Patient ID: Maria [name], female    DOB: 08-Jan-1956, 62 y.o.   MRN: [id number]  Diabetes  She presents for her follow-up diabetic visit. She has type 2 diabetes mellitus. There are no hypoglycemic associated symptoms. There are no diabetic associated symptoms. There are no hypoglycemic complications. There are no diabetic complications. She does not see a podiatrist.Eye exam is current.   Results for orders placed or performed in visit on 07/19/17  Lipid panel  Result Value Ref Range   Cholesterol, Total 129 100 - 199 mg/dL   Triglycerides 107 0 - 149 mg/dL   HDL 41 >39 mg/dL   VLDL Cholesterol Cal 21 5 - 40 mg/dL   LDL Calculated 67 0 - 99 mg/dL   Chol/HDL Ratio 3.1 0.0 - 4.4 ratio  Hepatic function panel  Result Value Ref Range   Total Protein 6.5 6.0 - 8.5 g/dL   Albumin 4.2 3.6 - 4.8 g/dL   Bilirubin Total 0.4 0.0 - 1.2 mg/dL   Bilirubin, Direct 0.13 0.00 - 0.40 mg/dL   Alkaline Phosphatase 111 39 - 117 IU/L   AST 22 0 - 40 IU/L   ALT 17 0 - 32 IU/L     Patient claims compliance with diabetes medication. No obvious side effects. Reports no substantial low sugar spells. Most numbers are generally in good range when checked fasting. Generally does not miss a dose of medication. Watching diabetic diet closely  Blood pressure medicine and blood pressure levels reviewed today with patient. Compliant with blood pressure medicine. States does not miss a dose. No obvious side effects. Blood pressure generally good when checked elsewhere. Watching salt intake.  Diet overall is improved,  Most fsting glu rin in the arrange of 120 to 125 or so   Patient continues to take lipid medication regularly. No obvious side effects from it. Generally does not miss a dose. Prior blood work results are reviewed with patient. Patient continues to work on fat intake in diet     Review of Systems No headache, no major weight loss or weight gain, no chest pain no back pain  abdominal pain no change in bowel habits complete ROS otherwise negative     Objective:   Physical Exam   Alert and oriented, vitals reviewed and stable, NAD ENT-TM's and ext canals WNL bilat via otoscopic exam Soft palate, tonsils and post pharynx WNL via oropharyngeal exam Neck-symmetric, no masses; thyroid nonpalpable and nontender Pulmonary-no tachypnea or accessory muscle use; Clear without wheezes via auscultation Card--no abnrml murmurs, rhythm reg and rate WNL Carotid pulses symmetric, without bruits       Assessment & Plan:  Impression type 2 diabetes.  Control good discussed maintain same therapy  Hypertension.  Good on repeat.  Prior numbers reviewed compliance reviewed discussed to maintain same meds  3.  Lipidemia blood work reviewed to maintain same meds diet discussed  4.  Anxiety and depression.  Discussed medications maintain  Medications refilled/flu shot today follow-up visit scheduled

## 2017-12-06 ENCOUNTER — Other Ambulatory Visit: Payer: Self-pay | Admitting: Family Medicine

## 2017-12-07 ENCOUNTER — Other Ambulatory Visit: Payer: Self-pay | Admitting: Family Medicine

## 2017-12-07 NOTE — Telephone Encounter (Signed)
Ok three ref 

## 2017-12-24 DIAGNOSIS — H5203 Hypermetropia, bilateral: Secondary | ICD-10-CM | POA: Diagnosis not present

## 2017-12-24 DIAGNOSIS — Z794 Long term (current) use of insulin: Secondary | ICD-10-CM | POA: Diagnosis not present

## 2017-12-24 DIAGNOSIS — H524 Presbyopia: Secondary | ICD-10-CM | POA: Diagnosis not present

## 2017-12-24 DIAGNOSIS — H52221 Regular astigmatism, right eye: Secondary | ICD-10-CM | POA: Diagnosis not present

## 2017-12-24 LAB — HM DIABETES EYE EXAM

## 2018-03-04 ENCOUNTER — Ambulatory Visit (INDEPENDENT_AMBULATORY_CARE_PROVIDER_SITE_OTHER): Payer: Medicare Other | Admitting: Family Medicine

## 2018-03-04 ENCOUNTER — Encounter: Payer: Self-pay | Admitting: Family Medicine

## 2018-03-04 VITALS — BP 110/72 | Ht 62.0 in | Wt 159.0 lb

## 2018-03-04 DIAGNOSIS — E785 Hyperlipidemia, unspecified: Secondary | ICD-10-CM

## 2018-03-04 DIAGNOSIS — I10 Essential (primary) hypertension: Secondary | ICD-10-CM | POA: Diagnosis not present

## 2018-03-04 DIAGNOSIS — J019 Acute sinusitis, unspecified: Secondary | ICD-10-CM

## 2018-03-04 DIAGNOSIS — F329 Major depressive disorder, single episode, unspecified: Secondary | ICD-10-CM

## 2018-03-04 DIAGNOSIS — Z79899 Other long term (current) drug therapy: Secondary | ICD-10-CM

## 2018-03-04 DIAGNOSIS — E1140 Type 2 diabetes mellitus with diabetic neuropathy, unspecified: Secondary | ICD-10-CM

## 2018-03-04 DIAGNOSIS — J4540 Moderate persistent asthma, uncomplicated: Secondary | ICD-10-CM

## 2018-03-04 DIAGNOSIS — E039 Hypothyroidism, unspecified: Secondary | ICD-10-CM | POA: Diagnosis not present

## 2018-03-04 DIAGNOSIS — F32A Depression, unspecified: Secondary | ICD-10-CM

## 2018-03-04 DIAGNOSIS — F419 Anxiety disorder, unspecified: Secondary | ICD-10-CM

## 2018-03-04 DIAGNOSIS — Z794 Long term (current) use of insulin: Secondary | ICD-10-CM

## 2018-03-04 LAB — POCT GLYCOSYLATED HEMOGLOBIN (HGB A1C): Hemoglobin A1C: 6.4 % — AB (ref 4.0–5.6)

## 2018-03-04 MED ORDER — MONTELUKAST SODIUM 10 MG PO TABS: 10.0000 mg | ORAL_TABLET | Freq: Every day | ORAL | 1 refills | Status: DC

## 2018-03-04 MED ORDER — GLIPIZIDE ER 10 MG PO TB24: 10.0000 mg | ORAL_TABLET | Freq: Every day | ORAL | 1 refills | Status: DC

## 2018-03-04 MED ORDER — FLUTICASONE-SALMETEROL 500-50 MCG/DOSE IN AEPB: INHALATION_SPRAY | RESPIRATORY_TRACT | 2 refills | Status: DC

## 2018-03-04 MED ORDER — AMOXICILLIN 500 MG PO CAPS: 500.0000 mg | ORAL_CAPSULE | Freq: Three times a day (TID) | ORAL | 0 refills | Status: DC

## 2018-03-04 MED ORDER — ALBUTEROL SULFATE HFA 108 (90 BASE) MCG/ACT IN AERS: INHALATION_SPRAY | RESPIRATORY_TRACT | 2 refills | Status: DC

## 2018-03-04 NOTE — Patient Instructions (Signed)
Decrease glipizide to 1 tablet in the morning.   Continue taking an allergy medication daily, recommend over the counter zyrtec or cetrizine daily at night.   Notify us if you complete your antibiotic and are still having cough and needing to use your albuterol inhaler frequently.

## 2018-03-04 NOTE — Progress Notes (Signed)
Subjective:    Patient ID: Maria [name], female    DOB: October 09, 1955, 62 y.o.   MRN: [id number]  Diabetes  She presents for her follow-up diabetic visit. She has type 2 diabetes mellitus. Her disease course has been stable. Pertinent negatives for diabetes include no chest pain and no fatigue.    Patient is here today to follow up on her chronic health issues. She is diabetic and is currently taking Glipizide 10 mg two tablets Q am, Lantus 38 units Q evening, Metformin 500 mg two tablets bid. She has a history of depression and anxiety and says she no longer takes the citalopram 20 mg, as she felt she no longer needed it. She does however still take Xanax 1 mg one po Qhs prn sleep.  Reports hypoglycemia symptoms occasionally, wakes up feeling shaky and sweaty in the morning, doesn't check blood sugar just immediately eats something. Reports this occurs 3-4 times per month. Typically fasting blood sugars around 95-100. Denies any paresthesias, no chest pain or shortness of breath.   Denies any regular exercise but stays active with her grandchildren.    Reports stopped taking celexa, feels like her mood has stabilized and she feels she doesn't need it any longer. Denies SI/HI.   Taking advair daily, reports breathing is doing well, denies wheezing or shortness of breath, but reports coughing spells and taking albuterol inhaler up to tid. Reports waking up at night needing inhaler. States it stops the cough. Reports increased cough with PND and frontal sinus pressure x 1 month. Denies fevers. Reports taking a store brand of allergy medication every day.   Taking crestor daily, denies adverse effects.  Compliant with levothyroxine daily, denies any adverse effects. Compliant with lisinopril daily, denies any adverse effects.   Review of Systems  Constitutional: Negative for chills, fatigue, fever and unexpected weight change.  HENT: Positive for postnasal drip and sinus pressure. Negative for  ear pain and sore throat.   Respiratory: Positive for cough. Negative for shortness of breath and wheezing.   Cardiovascular: Negative for chest pain and leg swelling.  Musculoskeletal: Negative for myalgias.  Neurological: Negative for syncope.  Psychiatric/Behavioral: Negative for dysphoric mood and suicidal ideas.  All other systems reviewed and are negative.       Objective:   Physical Exam Vitals signs and nursing note reviewed.  Constitutional:      General: She is not in acute distress.    Appearance: She is well-developed.  HENT:     Head: Normocephalic and atraumatic.     Right Ear: Tympanic membrane normal.     Left Ear: Tympanic membrane normal.     Nose: Nose normal.     Mouth/Throat:     Mouth: Mucous membranes are moist.     Pharynx: Oropharynx is clear.  Eyes:     General:        Right eye: No discharge.        Left eye: No discharge.  Neck:     Musculoskeletal: Neck supple.     Thyroid: No thyromegaly.  Cardiovascular:     Rate and Rhythm: Normal rate and regular rhythm.     Heart sounds: Normal heart sounds. No murmur.  Pulmonary:     Effort: Pulmonary effort is normal. No respiratory distress.     Breath sounds: Normal breath sounds. No wheezing, rhonchi or rales.  Lymphadenopathy:     Cervical: No cervical adenopathy.  Skin:    General: Skin is warm and dry.  Neurological:     Mental Status: She is alert and oriented to person, place, and time.  Psychiatric:        Mood and Affect: Mood normal.       Results for orders placed or performed in visit on 03/04/18  POCT glycosylated hemoglobin (Hb A1C)  Result Value Ref Range   Hemoglobin A1C 6.4 (A) 4.0 - 5.6 %   HbA1c POC (<> result, manual entry)     HbA1c, POC (prediabetic range)     HbA1c, POC (controlled diabetic range)     Depression screen [facility] 2/9 03/04/2018 12/03/2017 07/19/2017 07/19/2017 07/17/2016  Decreased Interest 0 0 3 0 0  Down, Depressed, Hopeless 0 0 2 0 0  PHQ - 2 Score 0 0 5 0  0  Altered sleeping 3 3 3  - -  Tired, decreased energy 2 0 3 - -  Change in appetite 0 0 3 - -  Feeling bad or failure about yourself  0 0 2 - -  Trouble concentrating 0 0 1 - -  Moving slowly or fidgety/restless 0 0 0 - -  Suicidal thoughts 0 0 0 - -  PHQ-9 Score 5 3 17  - -  Difficult doing work/chores Not difficult at all - Somewhat difficult - -   GAD 7 : Generalized Anxiety Score 03/04/2018 12/03/2017 07/19/2017  Nervous, Anxious, on Edge 0 0 3  Control/stop worrying 0 0 3  Worry too much - different things 0 0 3  Trouble relaxing 0 0 3  Restless 0 0 2  Easily annoyed or irritable 0 0 3  Afraid - awful might happen 0 0 1  Total GAD 7 Score 0 0 18  Anxiety Difficulty Not difficult at all Not difficult at all Somewhat difficult          Assessment & Plan:  1. Type 2 diabetes mellitus with diabetic neuropathy, with long-term current use of insulin ([place]) - Plan: POCT glycosylated hemoglobin (Hb Q0G), Basic metabolic panel, Microalbumin / creatinine urine ratio  A1c today 6.4.  Given that patient is having weekly hypoglycemic episodes in the early mornings, recommend decreasing glipizide to 1 tablet in the mornings.  Continue with metformin and Lantus as prescribed.  Lab work ordered.  Recommend follow-up in 3 months with Dr. Richardson Landry.  Patient to notify [place] sooner if increasing hypoglycemia.  Warning signs discussed.  2. Hypothyroidism, unspecified type - Plan: TSH Continue with levothyroxine.  Will check TSH level and notify of results.  3. Hyperlipidemia with target LDL less than 100 - Plan: Lipid panel Doing well on daily Crestor.  Will continue.  Lipid panel checked today, will notify of results.  4. Moderate persistent chronic asthma without complication Continue with twice daily Advair inhaler and albuterol inhaler as needed.  We will go ahead and treat for possible secondary bacterial infection with amoxicillin to see if this helps improve cough, and decrease use of albuterol  inhaler.  Discussed with patient if she notices continued problems with cough despite antibiotic use she should follow-up sooner.  5. Essential hypertension - Plan: Basic metabolic panel, Microalbumin / creatinine urine ratio Doing well on current dose of lisinopril.  Will continue.  Lab work ordered.  6. Anxiety and depression Patient reports stopping Celexa.  Does not feel it is needed at this time.  PHQ 9 and gad 7 results reviewed with patient.  It is appropriate to stop this medication.  If she notices increased symptoms of depression or anxiety she should notify  us.  7. High risk medication use - Plan: Hepatic function panel  Dr. Mickie Hillier was consulted on this case and is in agreement with the above treatment plan.

## 2018-03-05 LAB — HEPATIC FUNCTION PANEL
ALK PHOS: 124 IU/L — AB (ref 39–117)
ALT: 21 IU/L (ref 0–32)
AST: 22 IU/L (ref 0–40)
Albumin: 4.7 g/dL (ref 3.6–4.8)
Bilirubin Total: 0.6 mg/dL (ref 0.0–1.2)
Bilirubin, Direct: 0.2 mg/dL (ref 0.00–0.40)
Total Protein: 7 g/dL (ref 6.0–8.5)

## 2018-03-05 LAB — MICROALBUMIN / CREATININE URINE RATIO
Creatinine, Urine: 86.6 mg/dL
Microalb/Creat Ratio: 16.3 mg/g creat (ref 0.0–30.0)
Microalbumin, Urine: 14.1 ug/mL

## 2018-03-05 LAB — LIPID PANEL
Chol/HDL Ratio: 3.5 ratio (ref 0.0–4.4)
Cholesterol, Total: 159 mg/dL (ref 100–199)
HDL: 45 mg/dL (ref >39)
LDL Calculated: 83 mg/dL (ref 0–99)
Triglycerides: 153 mg/dL — ABNORMAL HIGH (ref 0–149)
VLDL CHOLESTEROL CAL: 31 mg/dL (ref 5–40)

## 2018-03-05 LAB — BASIC METABOLIC PANEL
BUN/Creatinine Ratio: 13 (ref 12–28)
BUN: 11 mg/dL (ref 8–27)
CO2: 23 mmol/L (ref 20–29)
Calcium: 9.7 mg/dL (ref 8.7–10.3)
Chloride: 101 mmol/L (ref 96–106)
Creatinine, Ser: 0.85 mg/dL (ref 0.57–1.00)
GFR calc Af Amer: 85 mL/min/{1.73_m2} (ref >59)
GFR calc non Af Amer: 74 mL/min/{1.73_m2} (ref >59)
Glucose: 66 mg/dL (ref 65–99)
Potassium: 3.8 mmol/L (ref 3.5–5.2)
Sodium: 140 mmol/L (ref 134–144)

## 2018-03-05 LAB — TSH: TSH: 1.09 u[IU]/mL (ref 0.450–4.500)

## 2018-03-08 ENCOUNTER — Telehealth: Payer: Self-pay | Admitting: Family Medicine

## 2018-03-08 NOTE — Telephone Encounter (Signed)
Phone message open per provider.

## 2018-03-11 NOTE — Telephone Encounter (Signed)
Call pt, b w showed thyroid excellent, kid and liver fuction fine, chol panel very good

## 2018-03-11 NOTE — Telephone Encounter (Signed)
Discussed with pt. Pt verbalized understanding.  °

## 2018-04-04 ENCOUNTER — Other Ambulatory Visit: Payer: Self-pay | Admitting: Family Medicine

## 2018-04-22 ENCOUNTER — Other Ambulatory Visit: Payer: Self-pay | Admitting: Family Medicine

## 2018-05-27 ENCOUNTER — Ambulatory Visit (INDEPENDENT_AMBULATORY_CARE_PROVIDER_SITE_OTHER): Payer: Medicare Other | Admitting: Family Medicine

## 2018-05-27 ENCOUNTER — Encounter: Payer: Self-pay | Admitting: Family Medicine

## 2018-05-27 VITALS — BP 126/84 | Temp 98.4°F | Wt 158.8 lb

## 2018-05-27 DIAGNOSIS — J4521 Mild intermittent asthma with (acute) exacerbation: Secondary | ICD-10-CM | POA: Diagnosis not present

## 2018-05-27 DIAGNOSIS — R05 Cough: Secondary | ICD-10-CM | POA: Diagnosis not present

## 2018-05-27 DIAGNOSIS — J019 Acute sinusitis, unspecified: Secondary | ICD-10-CM | POA: Diagnosis not present

## 2018-05-27 MED ORDER — ALBUTEROL SULFATE (2.5 MG/3ML) 0.083% IN NEBU
2.5000 mg | INHALATION_SOLUTION | Freq: Once | RESPIRATORY_TRACT | Status: AC
  Administered 2018-05-27: 2.5 mg via RESPIRATORY_TRACT

## 2018-05-27 MED ORDER — PREDNISONE 20 MG PO TABS: ORAL_TABLET | ORAL | 0 refills | Status: DC

## 2018-05-27 MED ORDER — AZITHROMYCIN 250 MG PO TABS: ORAL_TABLET | ORAL | 0 refills | Status: DC

## 2018-05-27 NOTE — Progress Notes (Signed)
   Subjective:    Patient ID: Maria [name], female    DOB: April 26, 1955, 63 y.o.   MRN: [id number]  Cough  This is a new problem. The current episode started in the past 7 days. Associated symptoms include headaches, nasal congestion, rhinorrhea and wheezing. Pertinent negatives include no chest pain, ear pain, fever or shortness of breath. She has tried OTC cough suppressant (mucinex) for the symptoms.   Significant cough congestion intermittent wheezing feels short of breath with activity O2 saturation sitting still 95% patient denies high fever chills sweats   Review of Systems  Constitutional: Negative for activity change and fever.  HENT: Positive for congestion and rhinorrhea. Negative for ear pain.   Eyes: Negative for discharge.  Respiratory: Positive for cough and wheezing. Negative for shortness of breath.   Cardiovascular: Negative for chest pain.  Neurological: Positive for headaches.       Objective:   Physical Exam Vitals signs and nursing note reviewed.  Constitutional:      Appearance: She is well-developed.  HENT:     Head: Normocephalic.     Nose: Nose normal.     Mouth/Throat:     Pharynx: No oropharyngeal exudate.  Neck:     Musculoskeletal: Neck supple.  Cardiovascular:     Rate and Rhythm: Normal rate.     Heart sounds: Normal heart sounds. No murmur.  Pulmonary:     Effort: Pulmonary effort is normal.     Breath sounds: Wheezing present.  Lymphadenopathy:     Cervical: No cervical adenopathy.  Skin:    General: Skin is warm and dry.    No tachypnea no retractions no nasal flaring not air hungry       Assessment & Plan:  Reactive airway Recent secondary infection Zithromax and prednisone taper May need to adjust upward on insulin Warning signs discussed If progressive troubles or worse to go to the emergency department Follow-up here if problems Give [place] update in 48 hours

## 2018-06-03 ENCOUNTER — Ambulatory Visit (INDEPENDENT_AMBULATORY_CARE_PROVIDER_SITE_OTHER): Payer: Medicare Other | Admitting: Family Medicine

## 2018-06-03 ENCOUNTER — Other Ambulatory Visit: Payer: Self-pay

## 2018-06-03 VITALS — BP 120/74 | Ht 62.0 in | Wt 156.0 lb

## 2018-06-03 DIAGNOSIS — E1140 Type 2 diabetes mellitus with diabetic neuropathy, unspecified: Secondary | ICD-10-CM

## 2018-06-03 DIAGNOSIS — Z794 Long term (current) use of insulin: Principal | ICD-10-CM

## 2018-06-03 LAB — POCT GLYCOSYLATED HEMOGLOBIN (HGB A1C): Hemoglobin A1C: 7.4 % — AB (ref 4.0–5.6)

## 2018-06-03 MED ORDER — ALBUTEROL SULFATE HFA 108 (90 BASE) MCG/ACT IN AERS: INHALATION_SPRAY | RESPIRATORY_TRACT | 2 refills | Status: DC

## 2018-06-03 MED ORDER — ALBUTEROL SULFATE (2.5 MG/3ML) 0.083% IN NEBU: INHALATION_SOLUTION | RESPIRATORY_TRACT | 0 refills | Status: DC

## 2018-06-03 MED ORDER — METFORMIN HCL 500 MG PO TABS: ORAL_TABLET | ORAL | 5 refills | Status: DC

## 2018-06-03 MED ORDER — GLIPIZIDE ER 10 MG PO TB24: 10.0000 mg | ORAL_TABLET | Freq: Every day | ORAL | 1 refills | Status: DC

## 2018-06-03 MED ORDER — OMEPRAZOLE 40 MG PO CPDR: 40.0000 mg | DELAYED_RELEASE_CAPSULE | Freq: Every day | ORAL | 0 refills | Status: DC

## 2018-06-03 MED ORDER — ROSUVASTATIN CALCIUM 20 MG PO TABS: 20.0000 mg | ORAL_TABLET | Freq: Every day | ORAL | 1 refills | Status: DC

## 2018-06-03 MED ORDER — INSULIN GLARGINE 100 UNIT/ML SOLOSTAR PEN: PEN_INJECTOR | SUBCUTANEOUS | 5 refills | Status: DC

## 2018-06-03 MED ORDER — FLUTICASONE-SALMETEROL 500-50 MCG/DOSE IN AEPB: INHALATION_SPRAY | RESPIRATORY_TRACT | 2 refills | Status: DC

## 2018-06-03 MED ORDER — MONTELUKAST SODIUM 10 MG PO TABS: 10.0000 mg | ORAL_TABLET | Freq: Every day | ORAL | 1 refills | Status: DC

## 2018-06-03 MED ORDER — LEVOTHYROXINE SODIUM 75 MCG PO TABS: 75.0000 ug | ORAL_TABLET | Freq: Every day | ORAL | 1 refills | Status: DC

## 2018-06-03 MED ORDER — MOMETASONE FUROATE 0.1 % EX CREA: 1.0000 "application " | TOPICAL_CREAM | Freq: Every day | CUTANEOUS | 0 refills | Status: DC

## 2018-06-03 MED ORDER — LISINOPRIL 5 MG PO TABS: ORAL_TABLET | ORAL | 1 refills | Status: DC

## 2018-06-03 NOTE — Progress Notes (Signed)
   Subjective:    Patient ID: Maria [name], female    DOB: 1955/10/28, 63 y.o.   MRN: [id number]  HPI  Patient is here today to follow up on her chronic health issues.  She has a history of asthma:She is taking singular 10 mg one per day,albuterol inhaler prn,Advair 500-50 one bid.  Gerd:She is taking omeprazole 40 mg per day.  Diabetes: She takes Metformin 500 mg two tablets once per day.Glipizide 10 mg once per day,Lantus 38 units Qhs.  Hyperlipidemia: Rosuvastatin 20 mg once per day.  Hypothyroidism: Levothyroxine 75 mcg once per day.  Results for orders placed or performed in visit on 06/03/18  POCT glycosylated hemoglobin (Hb A1C)  Result Value Ref Range   Hemoglobin A1C 7.4 (A) 4.0 - 5.6 %   HbA1c POC (<> result, manual entry)     HbA1c, POC (prediabetic range)     HbA1c, POC (controlled diabetic range)        Review of Systems No headache no chest pain no shortness of breath    Objective:   Physical Exam  Alert vitals stable, NAD. Blood pressure good on repeat. HEENT normal. Lungs clear. Heart regular rate and rhythm.       Assessment & Plan:  Impression 1 type 2 diabetes.  Decent control though not perfect.  Discussed/diet discussed medications refilled.  Follow-up as scheduled  2.  Hypertension good control discussed maintain same meds

## 2018-06-13 ENCOUNTER — Telehealth: Payer: Self-pay | Admitting: Family Medicine

## 2018-06-13 MED ORDER — KETOCONAZOLE 2 % EX CREA: TOPICAL_CREAM | CUTANEOUS | 2 refills | Status: DC

## 2018-06-13 NOTE — Telephone Encounter (Signed)
Patient is aware medication sent to the requested pharmacy.

## 2018-06-13 NOTE — Telephone Encounter (Signed)
Please advise 

## 2018-06-13 NOTE — Telephone Encounter (Signed)
Patient experiencing yeast infection underneath bilateral breast for x 3 days, no fever, no nausea or vomiting, would like ointment prescribed, advise.    Pharmacy:  [place], [place]

## 2018-06-13 NOTE — Telephone Encounter (Signed)
In this particular case I recommend ketoconazole cream applied twice daily, 45 g tube, 2 refills, apply twice daily over the course of the next 10 days to 14 days

## 2018-06-14 ENCOUNTER — Ambulatory Visit (INDEPENDENT_AMBULATORY_CARE_PROVIDER_SITE_OTHER): Payer: Medicare Other | Admitting: Family Medicine

## 2018-06-14 ENCOUNTER — Other Ambulatory Visit: Payer: Self-pay

## 2018-06-14 DIAGNOSIS — J45901 Unspecified asthma with (acute) exacerbation: Secondary | ICD-10-CM

## 2018-06-14 MED ORDER — DOXYCYCLINE HYCLATE 100 MG PO TABS: 100.0000 mg | ORAL_TABLET | Freq: Two times a day (BID) | ORAL | 0 refills | Status: DC

## 2018-06-14 MED ORDER — PREDNISONE 20 MG PO TABS: ORAL_TABLET | ORAL | 0 refills | Status: DC

## 2018-06-14 NOTE — Progress Notes (Signed)
   Subjective:    Patient ID: [name], female    DOB: 06-30-1955, 63 y.o.   MRN: [id number]  Cough  This is a new problem. The current episode started yesterday. Associated symptoms include nasal congestion, shortness of breath and wheezing. Associated symptoms comments: Back hurts when coughing. Treatments tried: neb treatment and inhaler.   Patient was seen march 9th with similar sx and finished all antibiotic and steroids and was completely better but then it returned yesterday   Review of Systems  Respiratory: Positive for cough, shortness of breath and wheezing.        Objective:   Physical Exam 19 alert positive nasal alert positive nasal congestion lungs bilateral wheezes no obvious tachypnea deep bronchial cough       Assessment & Plan:  Impression rhinosinusitis/bronchitis with exacerbation of asthma plan prednisone taper.  Antibiotics prescribed symptom care

## 2018-06-18 ENCOUNTER — Encounter: Payer: Self-pay | Admitting: Family Medicine

## 2018-06-21 ENCOUNTER — Other Ambulatory Visit: Payer: Self-pay | Admitting: Family Medicine

## 2018-06-22 NOTE — Telephone Encounter (Signed)
6 mo ok

## 2018-07-01 ENCOUNTER — Ambulatory Visit (INDEPENDENT_AMBULATORY_CARE_PROVIDER_SITE_OTHER): Payer: Medicare Other | Admitting: Family Medicine

## 2018-07-01 ENCOUNTER — Other Ambulatory Visit: Payer: Self-pay

## 2018-07-01 DIAGNOSIS — H6501 Acute serous otitis media, right ear: Secondary | ICD-10-CM

## 2018-07-01 MED ORDER — AMOXICILLIN-POT CLAVULANATE 875-125 MG PO TABS: 1.0000 | ORAL_TABLET | Freq: Two times a day (BID) | ORAL | 0 refills | Status: AC

## 2018-07-01 NOTE — Progress Notes (Signed)
   Subjective:    Patient ID: Maria [name], female    DOB: [birth year], 63 y.o.   MRN: [id number]  HPI Patient calls with right ear pain. Patient states her other symptoms are getting better but now her right ear is bothering her.  prog pain hurting   coughing ytoo  Sharp pains  Feels painful  hearing diminished    Virtual Visit via Video Note  I connected with Maria [name] on 07/01/18 at  3:50 PM EDT by a video enabled telemedicine application and verified that I am speaking with the correct person using two identifiers.   I discussed the limitations of evaluation and management by telemedicine and the availability of in person appointments. The patient expressed understanding and agreed to proceed.  History of Present Illness:    Observations/Objective:   Assessment and Plan:   Follow Up Instructions:    I discussed the assessment and treatment plan with the patient. The patient was provided an opportunity to ask questions and all were answered. The patient agreed with the plan and demonstrated an understanding of the instructions.   The patient was advised to call back or seek an in-person evaluation if the symptoms worsen or if the condition fails to improve as anticipa I provided15 minutes of non-face-to-face time during this encounter.       Review of Systems     Objective:   Physical Exam        Assessment & Plan:  Impression probable right otitis media.  Positive pain.  Positive diminished hearing positive recent respiratory infection.  Over-the-counter meds not helping.  Will start prescribed antibiotics warning signs discussed symptom care discussed

## 2018-07-08 ENCOUNTER — Telehealth: Payer: Self-pay | Admitting: Family Medicine

## 2018-07-08 MED ORDER — NEOMYCIN-POLYMYXIN-HC 3.5-10000-1 OT SOLN: 3.0000 [drp] | Freq: Four times a day (QID) | OTIC | 0 refills | Status: DC

## 2018-07-08 NOTE — Telephone Encounter (Signed)
Pt would like some eardrops called in. She had a virtual visit on 07/01/2018 and prescribed an antibiotic. She has three days left of that and her ear is feeling better but it is now draining.   J[place], [place]

## 2018-07-08 NOTE — Telephone Encounter (Signed)
Cortisporin otic sup three to four drois affected ear qid

## 2018-07-08 NOTE — Telephone Encounter (Signed)
Prescription sent electronically to pharmacy. Patient notified. 

## 2018-08-22 ENCOUNTER — Emergency Department (HOSPITAL_COMMUNITY): Payer: Medicare Other

## 2018-08-22 ENCOUNTER — Encounter (HOSPITAL_COMMUNITY): Payer: Self-pay

## 2018-08-22 ENCOUNTER — Other Ambulatory Visit: Payer: Self-pay

## 2018-08-22 ENCOUNTER — Emergency Department (HOSPITAL_COMMUNITY)
Admission: EM | Admit: 2018-08-22 | Discharge: 2018-08-22 | Disposition: A | Discharge status: Home / Self Care | Payer: Medicare Other | Attending: Emergency Medicine | Admitting: Emergency Medicine

## 2018-08-22 DIAGNOSIS — Y999 Unspecified external cause status: Secondary | ICD-10-CM | POA: Diagnosis not present

## 2018-08-22 DIAGNOSIS — S82852A Displaced trimalleolar fracture of left lower leg, initial encounter for closed fracture: Secondary | ICD-10-CM | POA: Insufficient documentation

## 2018-08-22 DIAGNOSIS — S82851A Displaced trimalleolar fracture of right lower leg, initial encounter for closed fracture: Secondary | ICD-10-CM | POA: Diagnosis not present

## 2018-08-22 DIAGNOSIS — W108XXA Fall (on) (from) other stairs and steps, initial encounter: Secondary | ICD-10-CM | POA: Diagnosis not present

## 2018-08-22 DIAGNOSIS — Z8541 Personal history of malignant neoplasm of cervix uteri: Secondary | ICD-10-CM | POA: Insufficient documentation

## 2018-08-22 DIAGNOSIS — J45909 Unspecified asthma, uncomplicated: Secondary | ICD-10-CM | POA: Diagnosis not present

## 2018-08-22 DIAGNOSIS — W19XXXA Unspecified fall, initial encounter: Secondary | ICD-10-CM | POA: Diagnosis not present

## 2018-08-22 DIAGNOSIS — S9302XA Subluxation of left ankle joint, initial encounter: Secondary | ICD-10-CM | POA: Diagnosis not present

## 2018-08-22 DIAGNOSIS — E039 Hypothyroidism, unspecified: Secondary | ICD-10-CM | POA: Diagnosis not present

## 2018-08-22 DIAGNOSIS — Z79899 Other long term (current) drug therapy: Secondary | ICD-10-CM | POA: Insufficient documentation

## 2018-08-22 DIAGNOSIS — R609 Edema, unspecified: Secondary | ICD-10-CM | POA: Diagnosis not present

## 2018-08-22 DIAGNOSIS — Z87891 Personal history of nicotine dependence: Secondary | ICD-10-CM | POA: Diagnosis not present

## 2018-08-22 DIAGNOSIS — Z794 Long term (current) use of insulin: Secondary | ICD-10-CM | POA: Insufficient documentation

## 2018-08-22 DIAGNOSIS — E1140 Type 2 diabetes mellitus with diabetic neuropathy, unspecified: Secondary | ICD-10-CM | POA: Insufficient documentation

## 2018-08-22 DIAGNOSIS — S99912A Unspecified injury of left ankle, initial encounter: Secondary | ICD-10-CM | POA: Diagnosis present

## 2018-08-22 DIAGNOSIS — I959 Hypotension, unspecified: Secondary | ICD-10-CM | POA: Diagnosis not present

## 2018-08-22 DIAGNOSIS — R52 Pain, unspecified: Secondary | ICD-10-CM | POA: Diagnosis not present

## 2018-08-22 DIAGNOSIS — Y92009 Unspecified place in unspecified non-institutional (private) residence as the place of occurrence of the external cause: Secondary | ICD-10-CM | POA: Diagnosis not present

## 2018-08-22 DIAGNOSIS — S99922A Unspecified injury of left foot, initial encounter: Secondary | ICD-10-CM | POA: Diagnosis not present

## 2018-08-22 DIAGNOSIS — Y9389 Activity, other specified: Secondary | ICD-10-CM | POA: Diagnosis not present

## 2018-08-22 MED ORDER — ONDANSETRON 4 MG PO TBDP: 4.0000 mg | ORAL_TABLET | Freq: Three times a day (TID) | ORAL | 0 refills | Status: DC | PRN

## 2018-08-22 MED ORDER — HYDROMORPHONE HCL 2 MG PO TABS
2.0000 mg | ORAL_TABLET | Freq: Once | ORAL | Status: AC
  Administered 2018-08-22: 20:00:00 2 mg via ORAL
  Filled 2018-08-22: qty 1

## 2018-08-22 MED ORDER — HYDROMORPHONE HCL 2 MG PO TABS: 2.0000 mg | ORAL_TABLET | Freq: Four times a day (QID) | ORAL | 0 refills | Status: DC | PRN

## 2018-08-22 NOTE — ED Provider Notes (Signed)
[facility] DE[facility] Provider Note   CSN: [id number] Arrival date & time: 08/22/18  1708    History   Chief Complaint Chief Complaint  Patient presents with  . Fall  . Foot Injury  . Ankle Injury    HPI Maria [name] is a 63 y.o. female.     The history is provided by the patient. No language interpreter was used.  Fall   Foot Injury  Ankle Injury    Maria [name] is a 63 y.o. female who presents to the Emergency Department complaining of foot injury. He presents to the emergency department for evaluation of a foot and ankle injury following a fall that occurred earlier today. She was walking down some stairs when she missteps and felt her left foot bend. She fell down three steps. She looked at her foot and it looked deformed and then it popped twice and look like it was back in place. She complains of severe pain in her left ankle and foot that radiates from the ankle to the toes. No prior similar symptoms. She denies any recent illnesses. No known coronavirus exposures. Her brother recently passed away. This occurred when she was cleaning out his home. She lives with her husband and son. There is only one step in her home. Past Medical History:  Diagnosis Date  . Allergy   . Asthma   . Cancer (HCC)    cervical  . CTS (carpal tunnel syndrome)   . Diabetes mellitus without complication ([place])   . GERD (gastroesophageal reflux disease)   . Glaucoma   . Hyperlipidemia   . Hypothyroidism   . Neuropathy   . Vitamin D deficiency     Patient Active Problem List   Diagnosis Date Noted  . Anxiety and depression 07/19/2017  . Diabetic gastroparesis associated with type 2 diabetes mellitus ([place]) 04/19/2016  . Insomnia 07/14/2015  . Encounter for long-term opiate analgesic use 10/02/2014  . Vitamin D deficiency 06/29/2014  . GERD (gastroesophageal reflux disease) 03/24/2013  . Hyperlipidemia with target LDL less than 100 12/13/2012  . Type 2  diabetes mellitus with diabetic neuropathy ([place]) 06/06/2012  . Hypothyroidism 06/06/2012  . Asthma, chronic 06/06/2012  . Glaucoma 06/06/2012    Past Surgical History:  Procedure Laterality Date  . ABDOMINAL HYSTERECTOMY    . CARDIAC CATHETERIZATION  2006   negative  . COLONOSCOPY  2009   negative  . INNER EAR SURGERY Right 1981  . left shoulder surgery Left 10/2009     OB History    Gravida  2   Para  2   Term  2   Preterm      AB      Living        SAB      TAB      Ectopic      Multiple      Live Births               Home Medications    Prior to Admission medications   Medication Sig Start Date End Date Taking? Authorizing Provider  acyclovir ointment (ZOVIRAX) 5 % APPLY 5 TIMES A DAY AS NEEDED FOR FEVER BLISTERS 11/20/17   Mikey Kirschner, MD  albuterol (PROAIR HFA) 108 (90 Base) MCG/ACT inhaler Inhale 2 puffs into the lungs every 6 (six) hours as needed for wheezi ng or shortness of breath. 06/03/18   Mikey Kirschner, MD  albuterol (PROVENTIL) (2.5 MG/3ML) 0.083% nebulizer solution USE  1 VIAL IN NEBULIZER EVERY 4 HOURS AS NEEDED FOR WHEEZING 06/03/18   Mikey Kirschner, MD  ALPRAZolam Duanne Moron) 1 MG tablet TAKE 1 TABLET AT BEDTIME AS NEEDED FOR SLEEP 06/24/18   Mikey Kirschner, MD  azithromycin (ZITHROMAX Z-PAK) 250 MG tablet Take 2 tablets (500 mg) on  Day 1,  followed by 1 tablet (250 mg) once daily on Days 2 through 5. 05/27/18   Luking, Elayne Snare, MD  cyclobenzaprine (FLEXERIL) 10 MG tablet Take 1 tablet (10 mg total) by mouth 3 (three) times daily as needed for muscle spasms. 03/24/16   Mikey Kirschner, MD  Fluticasone-Salmeterol (ADVAIR DISKUS) 500-50 MCG/DOSE AEPB 1 PUFF TWICE DAILY. RINSE MOUTH WITH WATER AFTER EACH USE. 06/03/18   Mikey Kirschner, MD  glipiZIDE (GLIPIZIDE XL) 10 MG 24 hr tablet Take 1 tablet (10 mg total) by mouth daily with breakfast. 06/03/18   Mikey Kirschner, MD  HYDROmorphone (DILAUDID) 2 MG tablet Take 1 tablet (2 mg total)  by mouth every 6 (six) hours as needed for severe pain. 08/22/18   Quintella Reichert, MD  Insulin Glargine (LANTUS SOLOSTAR) 100 UNIT/ML Solostar Pen INJECT 42 UNITS SQ EVERY EVENING FOR DIABETES 06/03/18   Mikey Kirschner, MD  ketoconazole (NIZORAL) 2 % cream Apply topically 2 (two) times daily. Patient taking differently: Apply 1 application topically 2 (two) times daily as needed for irritation.  11/05/12   Kathyrn Drown, MD  ketoconazole (NIZORAL) 2 % cream Apply Bid over the course of the next 10-14 days 06/13/18   Kathyrn Drown, MD  levothyroxine (SYNTHROID, LEVOTHROID) 75 MCG tablet Take 1 tablet (75 mcg total) by mouth daily. 06/03/18   Mikey Kirschner, MD  lisinopril (PRINIVIL,ZESTRIL) 5 MG tablet TAKE (1) TABLET DAILY FOR HIGH BLOOD PRESSURE. 06/03/18   Mikey Kirschner, MD  LUMIGAN 0.01 % SOLN  06/16/16   [provider]  metFORMIN (GLUCOPHAGE) 500 MG tablet TAKE 2 TABLETS 2 TIMES A DAY WITH MEALS 06/03/18   Mikey Kirschner, MD  mometasone (ELOCON) 0.1 % cream Apply 1 application topically daily. 06/03/18   Mikey Kirschner, MD  montelukast (SINGULAIR) 10 MG tablet Take 1 tablet (10 mg total) by mouth daily. 06/03/18   Mikey Kirschner, MD  neomycin-polymyxin-hydrocortisone (CORTISPORIN) OTIC solution Place 3-4 drops into the right ear 4 (four) times daily. 07/08/18   Mikey Kirschner, MD  omeprazole (PRILOSEC) 40 MG capsule Take 1 capsule (40 mg total) by mouth daily. 06/03/18   Mikey Kirschner, MD  ondansetron (ZOFRAN ODT) 4 MG disintegrating tablet Take 1 tablet (4 mg total) by mouth every 8 (eight) hours as needed for nausea or vomiting. 08/22/18   Quintella Reichert, MD  predniSONE (DELTASONE) 20 MG tablet 3qd for 3d then 2qd for 3d then 1qd for 3d 06/14/18   Mikey Kirschner, MD  rosuvastatin (CRESTOR) 20 MG tablet Take 1 tablet (20 mg total) by mouth daily. 06/03/18   Mikey Kirschner, MD  triamcinolone cream (KENALOG) 0.1 % APPLY TO AFFECTED AREAS 2 TIMES A DAY AS NEEDED  03/24/16   Mikey Kirschner, MD  ULTICARE SHORT PEN NEEDLES 31G X 8 MM MISC USE AS DIRECTED 04/22/18   Mikey Kirschner, MD  ZOVIRAX 5 % APPLY 5 TIMES A DAY AS NEEDED FOR FEVER BLISTERS 06/05/16   Kathyrn Drown, MD    Family History Family History  Problem Relation Age of Onset  . Diabetes Mother   . Prostate cancer Father   .  Heart disease Father   . Heart attack Other     Social History Social History   Tobacco Use  . Smoking status: Former Smoker    Packs/day: 1.00    Years: 15.00    Pack years: 15.00    Types: Cigarettes    Last attempt to quit: 06/05/2001    Years since quitting: 17.2  . Smokeless tobacco: Never Used  Substance Use Topics  . Alcohol use: No  . Drug use: No     Allergies   Patient has no known allergies.   Review of Systems Review of Systems   Physical Exam Updated Vital Signs BP 111/70 (BP Location: Left Arm)   Pulse (!) 113   Temp 98.1 F (36.7 C) (Oral)   Resp 20   Ht 5\' 2"  (1.575 m)   Wt 70.3 kg   SpO2 97%   BMI 28.35 kg/m   Physical Exam Vitals signs and nursing note reviewed.  Constitutional:      Appearance: She is well-developed.  HENT:     Head: Normocephalic and atraumatic.  Cardiovascular:     Rate and Rhythm: Normal rate and regular rhythm.  Pulmonary:     Effort: Pulmonary effort is normal. No respiratory distress.  Musculoskeletal:        General: Swelling and tenderness present.     Comments: 2+ DP pulses bilaterally. There is moderate edema to the left ankle and mid foot. There is tenderness to palpation throughout the ankle in mid foot. Wiggles toes.  Skin:    General: Skin is warm and dry.  Neurological:     Mental Status: She is alert and oriented to person, place, and time.  Psychiatric:        Mood and Affect: Mood normal.        Behavior: Behavior normal.      ED Treatments / Results  Labs (all labs ordered are listed, but only abnormal results are displayed) Labs Reviewed - No data to display   EKG None  Radiology Dg Ankle 2 Views Left  Result Date: 08/22/2018 CLINICAL DATA:  Postreduction. EXAM: LEFT ANKLE - 2 VIEW COMPARISON:  Earlier films, same date. FINDINGS: Lateral film demonstrates improved position and alignment of the fractures and reduction of the tibiotalar subluxation. IMPRESSION: Reduction of tibiotalar subluxation and improved position and alignment of trimalleolar fractures. Electronically Signed   By: Marijo Sanes M.D.   On: 08/22/2018 21:09   Dg Ankle Complete Left  Result Date: 08/22/2018 CLINICAL DATA:  63 year old female status post fall down steps. EXAM: LEFT ANKLE COMPLETE - 3+ VIEW COMPARISON:  None. FINDINGS: Acute bimalleolar fracture. Comminuted but largely nondisplaced fracture of the medial malleolus. Oblique and comminuted fracture of the distal left fibula metadiaphysis with mild lateral displacement. Impacted fracture of the posterior malleolus. Mild posterior subluxation of the mortise joint. The talar dome remains intact. Calcaneus appears intact with degenerative spurring. IMPRESSION: Trimalleolar fracture of the left ankle with mild posterior subluxation of the mortise joint. Electronically Signed   By: Genevie Ann M.D.   On: 08/22/2018 19:19   Dg Foot Complete Left  Result Date: 08/22/2018 CLINICAL DATA:  63 year old female status post fall down steps. EXAM: LEFT FOOT - COMPLETE 3+ VIEW COMPARISON:  Left ankle series today reported separately. FINDINGS: Trimalleolar fracture at the ankle is reported separately. Osteopenia. Calcaneus and other tarsal bones appear intact. Metatarsals and phalanges appear intact normal joint spaces for age. IMPRESSION: 1. Trimalleolar fracture reported separately. 2. No fracture or dislocation  identified in the left foot. Electronically Signed   By: Genevie Ann M.D.   On: 08/22/2018 19:20    Procedures .Ortho Injury Treatment Date/Time: 08/22/2018 9:11 PM Performed by: Quintella Reichert, MD Authorized by: Quintella Reichert, MD    Consent:    Consent obtained:  Verbal   Consent given by:  Patient   Risks discussed:  Fracture, vascular damage and irreducible dislocationInjury location: ankle Location details: left ankle Injury type: fracture-dislocation Fracture type: trimalleolar Pre-procedure neurovascular assessment: neurovascularly intact Pre-procedure distal perfusion: normal Pre-procedure range of motion: reduced  Anesthesia: Local anesthesia used: no  Patient sedated: NoManipulation performed: yes Skeletal traction used: no Reduction successful: yes X-ray confirmed reduction: yes Immobilization: splint Splint type: ankle stirrup and short leg Supplies used: cotton padding and Ortho-Glass Post-procedure neurovascular assessment: post-procedure neurovascularly intact Post-procedure distal perfusion: normal    (including critical care time)  Medications Ordered in ED Medications  HYDROmorphone (DILAUDID) tablet 2 mg (2 mg Oral Given 08/22/18 1956)     Initial Impression / Assessment and Plan / ED Course  I have reviewed the triage vital signs and the nursing notes.  Pertinent labs & imaging results that were available during my care of the patient were reviewed by me and considered in my medical decision making (see chart for details).       Patient here for evaluation of injuries following after falling down the stairs. She does have a try malleolus fracture on imaging that is displaced. By history it appears that she did have a dislocation that partially reduced prior to ED arrival. Discussed the case with Dr. Lucia Gaskins with Orthopedics, will place and splint and obtain CT of the left ankle. Plan to discharge home with close orthopedics follow-up as well as return precautions.    Patient declines crutches in ED - has access to walker at home.  Discussed that she cannot put weight on her left leg.    Final Clinical Impressions(s) / ED Diagnoses   Final diagnoses:  Closed trimalleolar fracture of  left ankle, initial encounter    ED Discharge Orders         Ordered    HYDROmorphone (DILAUDID) 2 MG tablet  Every 6 hours PRN     08/22/18 2110    ondansetron (ZOFRAN ODT) 4 MG disintegrating tablet  Every 8 hours PRN     08/22/18 2110           Quintella Reichert, MD 08/22/18 2133

## 2018-08-22 NOTE — Discharge Instructions (Signed)
Do not put weight on your left leg. You can take the Dilaudid, as prescribed as needed for pain. Try to keep your leg elevated above the level of your heart. Please follow-up with orthopedic surgery. Get rechecked immediately if you have uncontrolled pain or new concerning symptoms.

## 2018-08-22 NOTE — ED Triage Notes (Addendum)
Per EMS-Patient states she fell down some steps and her her left foot and left ankle pop x 2. Patient denies LOC or hitting her head.

## 2018-08-23 ENCOUNTER — Encounter: Payer: Self-pay | Admitting: Family Medicine

## 2018-08-23 ENCOUNTER — Ambulatory Visit (INDEPENDENT_AMBULATORY_CARE_PROVIDER_SITE_OTHER): Payer: Medicare Other | Admitting: Family Medicine

## 2018-08-23 DIAGNOSIS — R21 Rash and other nonspecific skin eruption: Secondary | ICD-10-CM

## 2018-08-23 MED ORDER — KETOCONAZOLE 2 % EX CREA: TOPICAL_CREAM | CUTANEOUS | 0 refills | Status: DC

## 2018-08-23 MED ORDER — FLUCONAZOLE 200 MG PO TABS: 200.0000 mg | ORAL_TABLET | Freq: Every day | ORAL | 0 refills | Status: DC

## 2018-08-23 NOTE — Progress Notes (Signed)
   Subjective:    Patient ID: Maria [name], female    DOB: 1955-06-30, 63 y.o.   MRN: [id number] Audio only  Dragon broken Rash  This is a new problem. Episode onset: Wednesday  Location: under stomach area. The rash is characterized by dryness and redness. Treatments tried: Elecon cream  The treatment provided no relief.     Pt states she had a rash under breast area a couple months ago. Pt states she usually needs an ABT to help clear the rash up. Pt states it looks to be yeast.  Virtual Visit via Video Note  I connected with Maria [name] on 08/23/18 at  9:30 AM EDT by a video enabled telemedicine application and verified that I am speaking with the correct person using two identifiers.  Location: Patient: home Provider: office   I discussed the limitations of evaluation and management by telemedicine and the availability of in person appointments. The patient expressed understanding and agreed to proceed.  History of Present Illness:    Observations/Objective:   Assessment and Plan:   Follow Up Instructions:    I discussed the assessment and treatment plan with the patient. The patient was provided an opportunity to ask questions and all were answered. The patient agreed with the plan and demonstrated an understanding of the instructions.   The patient was advised to call back or seek an in-person evaluation if the symptoms worsen or if the condition fails to improve as anticipated.  I provided  minutes of non-face-to-face time during this encounter.   Vicente Males, LPN     Review of Systems  Skin: Positive for rash.   No headache, no major weight loss or weight gain, no chest pain no back pain abdominal pain no change in bowel habits complete ROS otherwise negative     Objective:   Physical Exam    virt    Assessment & Plan:  Rash, likely fungal idisc, ques answer, options disc, will add oral due to severity

## 2018-08-26 DIAGNOSIS — S82842A Displaced bimalleolar fracture of left lower leg, initial encounter for closed fracture: Secondary | ICD-10-CM | POA: Diagnosis not present

## 2018-09-03 ENCOUNTER — Other Ambulatory Visit: Payer: Self-pay | Admitting: Orthopaedic Surgery

## 2018-09-04 ENCOUNTER — Encounter (HOSPITAL_COMMUNITY): Payer: Self-pay

## 2018-09-04 ENCOUNTER — Other Ambulatory Visit: Payer: Self-pay

## 2018-09-04 ENCOUNTER — Other Ambulatory Visit (HOSPITAL_COMMUNITY)
Admission: RE | Admit: 2018-09-04 | Discharge: 2018-09-04 | Disposition: A | Discharge status: Home / Self Care | Payer: Medicare Other | Source: Ambulatory Visit | Attending: Orthopaedic Surgery | Admitting: Orthopaedic Surgery

## 2018-09-04 ENCOUNTER — Encounter (HOSPITAL_COMMUNITY)
Admission: RE | Admit: 2018-09-04 | Discharge: 2018-09-04 | Disposition: A | Discharge status: Home / Self Care | Payer: Medicare Other | Source: Ambulatory Visit | Attending: Orthopaedic Surgery | Admitting: Orthopaedic Surgery

## 2018-09-04 DIAGNOSIS — Z7951 Long term (current) use of inhaled steroids: Secondary | ICD-10-CM | POA: Diagnosis not present

## 2018-09-04 DIAGNOSIS — Z833 Family history of diabetes mellitus: Secondary | ICD-10-CM | POA: Diagnosis not present

## 2018-09-04 DIAGNOSIS — M24072 Loose body in left ankle: Secondary | ICD-10-CM | POA: Diagnosis not present

## 2018-09-04 DIAGNOSIS — E559 Vitamin D deficiency, unspecified: Secondary | ICD-10-CM | POA: Diagnosis not present

## 2018-09-04 DIAGNOSIS — R000 Tachycardia, unspecified: Secondary | ICD-10-CM | POA: Insufficient documentation

## 2018-09-04 DIAGNOSIS — Z8042 Family history of malignant neoplasm of prostate: Secondary | ICD-10-CM | POA: Diagnosis not present

## 2018-09-04 DIAGNOSIS — Z87891 Personal history of nicotine dependence: Secondary | ICD-10-CM | POA: Diagnosis not present

## 2018-09-04 DIAGNOSIS — Z87442 Personal history of urinary calculi: Secondary | ICD-10-CM | POA: Diagnosis not present

## 2018-09-04 DIAGNOSIS — Z791 Long term (current) use of non-steroidal anti-inflammatories (NSAID): Secondary | ICD-10-CM | POA: Diagnosis not present

## 2018-09-04 DIAGNOSIS — Z8541 Personal history of malignant neoplasm of cervix uteri: Secondary | ICD-10-CM | POA: Diagnosis not present

## 2018-09-04 DIAGNOSIS — I10 Essential (primary) hypertension: Secondary | ICD-10-CM | POA: Diagnosis not present

## 2018-09-04 DIAGNOSIS — Z8249 Family history of ischemic heart disease and other diseases of the circulatory system: Secondary | ICD-10-CM | POA: Diagnosis not present

## 2018-09-04 DIAGNOSIS — J45909 Unspecified asthma, uncomplicated: Secondary | ICD-10-CM | POA: Diagnosis not present

## 2018-09-04 DIAGNOSIS — F419 Anxiety disorder, unspecified: Secondary | ICD-10-CM | POA: Diagnosis not present

## 2018-09-04 DIAGNOSIS — Z1159 Encounter for screening for other viral diseases: Secondary | ICD-10-CM | POA: Insufficient documentation

## 2018-09-04 DIAGNOSIS — E1140 Type 2 diabetes mellitus with diabetic neuropathy, unspecified: Secondary | ICD-10-CM | POA: Diagnosis not present

## 2018-09-04 DIAGNOSIS — G5600 Carpal tunnel syndrome, unspecified upper limb: Secondary | ICD-10-CM | POA: Diagnosis not present

## 2018-09-04 DIAGNOSIS — Z01818 Encounter for other preprocedural examination: Secondary | ICD-10-CM | POA: Insufficient documentation

## 2018-09-04 DIAGNOSIS — E785 Hyperlipidemia, unspecified: Secondary | ICD-10-CM | POA: Diagnosis not present

## 2018-09-04 DIAGNOSIS — K219 Gastro-esophageal reflux disease without esophagitis: Secondary | ICD-10-CM | POA: Diagnosis not present

## 2018-09-04 DIAGNOSIS — H409 Unspecified glaucoma: Secondary | ICD-10-CM | POA: Diagnosis not present

## 2018-09-04 DIAGNOSIS — Z79899 Other long term (current) drug therapy: Secondary | ICD-10-CM | POA: Diagnosis not present

## 2018-09-04 DIAGNOSIS — Z7982 Long term (current) use of aspirin: Secondary | ICD-10-CM | POA: Diagnosis not present

## 2018-09-04 DIAGNOSIS — Z794 Long term (current) use of insulin: Secondary | ICD-10-CM | POA: Diagnosis not present

## 2018-09-04 DIAGNOSIS — W010XXA Fall on same level from slipping, tripping and stumbling without subsequent striking against object, initial encounter: Secondary | ICD-10-CM | POA: Diagnosis not present

## 2018-09-04 DIAGNOSIS — S82852A Displaced trimalleolar fracture of left lower leg, initial encounter for closed fracture: Secondary | ICD-10-CM | POA: Diagnosis not present

## 2018-09-04 DIAGNOSIS — E039 Hypothyroidism, unspecified: Secondary | ICD-10-CM | POA: Diagnosis not present

## 2018-09-04 HISTORY — DX: Personal history of urinary calculi: Z87.442

## 2018-09-04 HISTORY — DX: Presence of spectacles and contact lenses: Z97.3

## 2018-09-04 HISTORY — DX: Other injury of unspecified body region, initial encounter: T14.8XXA

## 2018-09-04 HISTORY — DX: Other complications of anesthesia, initial encounter: T88.59XA

## 2018-09-04 HISTORY — DX: Essential (primary) hypertension: I10

## 2018-09-04 HISTORY — DX: Unspecified cataract: H26.9

## 2018-09-04 LAB — CBC
HCT: 39 % (ref 36.0–46.0)
Hemoglobin: 13.1 g/dL (ref 12.0–15.0)
MCH: 30.8 pg (ref 26.0–34.0)
MCHC: 33.6 g/dL (ref 30.0–36.0)
MCV: 91.5 fL (ref 80.0–100.0)
Platelets: 305 10*3/uL (ref 150–400)
RBC: 4.26 MIL/uL (ref 3.87–5.11)
RDW: 13 % (ref 11.5–15.5)
WBC: 6.7 10*3/uL (ref 4.0–10.5)
nRBC: 0 % (ref 0.0–0.2)

## 2018-09-04 LAB — SURGICAL PCR SCREEN
MRSA, PCR: NEGATIVE
Staphylococcus aureus: NEGATIVE

## 2018-09-04 LAB — SARS CORONAVIRUS 2 BY RT PCR (HOSPITAL ORDER, PERFORMED IN ~~LOC~~ HOSPITAL LAB): SARS Coronavirus 2: NEGATIVE

## 2018-09-04 LAB — BASIC METABOLIC PANEL
Anion gap: 10 (ref 5–15)
BUN: 18 mg/dL (ref 8–23)
CO2: 22 mmol/L (ref 22–32)
Calcium: 9.2 mg/dL (ref 8.9–10.3)
Chloride: 104 mmol/L (ref 98–111)
Creatinine, Ser: 0.93 mg/dL (ref 0.44–1.00)
GFR calc Af Amer: >60 mL/min (ref >60)
GFR calc non Af Amer: >60 mL/min (ref >60)
Glucose, Bld: 134 mg/dL — ABNORMAL HIGH (ref 70–99)
Potassium: 4 mmol/L (ref 3.5–5.1)
Sodium: 136 mmol/L (ref 135–145)

## 2018-09-04 LAB — HEMOGLOBIN A1C
Hgb A1c MFr Bld: 7.3 % — ABNORMAL HIGH (ref 4.8–5.6)
Mean Plasma Glucose: 162.81 mg/dL

## 2018-09-04 LAB — GLUCOSE, CAPILLARY: Glucose-Capillary: 136 mg/dL — ABNORMAL HIGH (ref 70–99)

## 2018-09-04 NOTE — Pre-Procedure Instructions (Signed)
KATELYNE GALSTER  09/04/2018     Your procedure is scheduled on Thursday, September 05, 2018  Report to [facility] Admitting at 7:30 A.M.  Call this number if you have problems the morning of surgery:  [phone]   Remember:  Do not eat or drink after midnight.  You may drink clear liquids until 4:30 A.M..  Clear liquids allowed are:  Water, Clear Tea, Black Coffee only and Gatorade. Finish prescribed G2 drink by 4:30 A.M.    Take these medicines the morning of surgery with A SIP OF WATER: levothyroxine (SYNTHROID), omeprazole (PRILOSEC),  montelukast (SINGULAIR)   Fluticasone-Salmeterol (ADVAIR DISKUS) If needed:acetaminophen (TYLENOL), ondansetron (ZOFRAN ODT),  If needed: albuterol (PROVENTIL)  nebulizer solution If needed: albuterol (PROAIR HFA)  Inhaler (bring inhaler in with you on day of surgery )  Stop taking Aspirin (unless otherwise advised by surgeon), phenylephrine (SUDAFED PE)  vitamins, fish oil and herbal medications. Do not take any NSAIDs ie: Ibuprofen, Advil, Naproxen (Aleve), Motrin, BC and Goody Powder; stop now.    How to Manage Your Diabetes Before and After Surgery  Why is it important to control my blood sugar before and after surgery? . Improving blood sugar levels before and after surgery helps healing and can limit problems. . A way of improving blood sugar control is eating a healthy diet by: o  Eating less sugar and carbohydrates o  Increasing activity/exercise o  Talking with your doctor about reaching your blood sugar goals . High blood sugars (greater than 180 mg/dL) can raise your risk of infections and slow your recovery, so you will need to focus on controlling your diabetes during the weeks before surgery. . Make sure that the doctor who takes care of your diabetes knows about your planned surgery including the date and location.  How do I manage my blood sugar before surgery? . Check your blood sugar at least 4 times a day, starting 2  days before surgery, to make sure that the level is not too high or low. o Check your blood sugar the morning of your surgery when you wake up and every 2 hours until you get to the Short Stay unit. . If your blood sugar is less than 70 mg/dL, you will need to treat for low blood sugar: o Do not take insulin. o Treat a low blood sugar (less than 70 mg/dL) with  cup of clear juice (cranberry or apple), 4 glucose tablets, OR glucose gel. Recheck blood sugar in 15 minutes after treatment (to make sure it is greater than 70 mg/dL). If your blood sugar is not greater than 70 mg/dL on recheck, call [phone] o  for further instructions. . Report your blood sugar to the short stay nurse when you get to Short Stay.  . If you are admitted to the hospital after surgery: o Your blood sugar will be checked by the staff and you will probably be given insulin after surgery (instead of oral diabetes medicines) to make sure you have good blood sugar levels. o The goal for blood sugar control after surgery is 80-180 mg/dL.  WHAT DO I DO ABOUT MY DIABETES MEDICATION?  Marland Kitchen Do not take oral diabetes medicines (pills) the morning of surgery such as metFORMIN (GLUCOPHAGE) and glipiZIDE (GLIPIZIDE XL)   . THE NIGHT BEFORE SURGERY, take 19-20 units of Insulin Glargine (LANTUS ) insulin.     . THE MORNING OF SURGERY, take no insulin.  Reviewed and Endorsed by [place] Patient  Education Committee, August 2015  Do not wear jewelry, make-up or nail polish.  Do not wear lotions, powders, or perfumes, or deodorant.  Do not shave 48 hours prior to surgery.   Do not bring valuables to the hospital.  [facility] is not responsible for any belongings or valuables.  Contacts, dentures or bridgework may not be worn into surgery.    Patients discharged the day of surgery will not be allowed to drive home.  Special instructions: Shower the night before surgery and the morning of surgery with CHG. Please read over the  following fact sheets that you were given. Pain Booklet, Coughing and Deep Breathing and Surgical Site Infection Prevention

## 2018-09-04 NOTE — Progress Notes (Signed)
Pt denies SOB, chest pain, and being under the care of a cardiologist. Pt denies having a stress test and echo. Pt denies having an EKG and chest x ray within the last year. Pt denies recent labs. Pt stated that her PCP is Dr. Rosemary Holms. Pt denies that she and family members tested positive for COVID-19 ( scheduled for today; reminded to quarantine).   Pt denies that she and family members experienced the following symptoms:  Cough yes/no: No Fever (>100.31F)  yes/no: No Runny nose yes/no: No Sore throat yes/no: No Difficulty breathing/shortness of breath  yes/no: No  Have you or a family member traveled in the last 14 days and where? yes/no: No   Pt reminded that hospital visitation restrictions are in effect and the importance of the restrictions.   Pt verbalized understanding of all pre-op instructions.

## 2018-09-05 ENCOUNTER — Ambulatory Visit (HOSPITAL_COMMUNITY): Payer: Medicare Other

## 2018-09-05 ENCOUNTER — Ambulatory Visit (HOSPITAL_COMMUNITY)
Admission: RE | Admit: 2018-09-05 | Discharge: 2018-09-05 | Disposition: A | Discharge status: Home / Self Care | Payer: Medicare Other | Attending: Orthopaedic Surgery | Admitting: Orthopaedic Surgery

## 2018-09-05 ENCOUNTER — Other Ambulatory Visit: Payer: Self-pay

## 2018-09-05 ENCOUNTER — Ambulatory Visit (HOSPITAL_COMMUNITY): Payer: Medicare Other | Admitting: Anesthesiology

## 2018-09-05 ENCOUNTER — Encounter (HOSPITAL_COMMUNITY)
Admission: RE | Disposition: A | Discharge status: Home / Self Care | Payer: Self-pay | Source: Home / Self Care | Attending: Orthopaedic Surgery

## 2018-09-05 ENCOUNTER — Encounter (HOSPITAL_COMMUNITY): Payer: Self-pay | Admitting: Anesthesiology

## 2018-09-05 DIAGNOSIS — E039 Hypothyroidism, unspecified: Secondary | ICD-10-CM | POA: Insufficient documentation

## 2018-09-05 DIAGNOSIS — M24072 Loose body in left ankle: Secondary | ICD-10-CM | POA: Insufficient documentation

## 2018-09-05 DIAGNOSIS — Z794 Long term (current) use of insulin: Secondary | ICD-10-CM | POA: Diagnosis not present

## 2018-09-05 DIAGNOSIS — Z7982 Long term (current) use of aspirin: Secondary | ICD-10-CM | POA: Diagnosis not present

## 2018-09-05 DIAGNOSIS — Z8249 Family history of ischemic heart disease and other diseases of the circulatory system: Secondary | ICD-10-CM | POA: Insufficient documentation

## 2018-09-05 DIAGNOSIS — W010XXA Fall on same level from slipping, tripping and stumbling without subsequent striking against object, initial encounter: Secondary | ICD-10-CM | POA: Insufficient documentation

## 2018-09-05 DIAGNOSIS — Z87442 Personal history of urinary calculi: Secondary | ICD-10-CM | POA: Insufficient documentation

## 2018-09-05 DIAGNOSIS — S82852A Displaced trimalleolar fracture of left lower leg, initial encounter for closed fracture: Secondary | ICD-10-CM | POA: Diagnosis not present

## 2018-09-05 DIAGNOSIS — G5600 Carpal tunnel syndrome, unspecified upper limb: Secondary | ICD-10-CM | POA: Diagnosis not present

## 2018-09-05 DIAGNOSIS — I10 Essential (primary) hypertension: Secondary | ICD-10-CM | POA: Insufficient documentation

## 2018-09-05 DIAGNOSIS — J45909 Unspecified asthma, uncomplicated: Secondary | ICD-10-CM | POA: Insufficient documentation

## 2018-09-05 DIAGNOSIS — H409 Unspecified glaucoma: Secondary | ICD-10-CM | POA: Insufficient documentation

## 2018-09-05 DIAGNOSIS — E119 Type 2 diabetes mellitus without complications: Secondary | ICD-10-CM | POA: Diagnosis not present

## 2018-09-05 DIAGNOSIS — F419 Anxiety disorder, unspecified: Secondary | ICD-10-CM | POA: Insufficient documentation

## 2018-09-05 DIAGNOSIS — Z833 Family history of diabetes mellitus: Secondary | ICD-10-CM | POA: Insufficient documentation

## 2018-09-05 DIAGNOSIS — Z8042 Family history of malignant neoplasm of prostate: Secondary | ICD-10-CM | POA: Insufficient documentation

## 2018-09-05 DIAGNOSIS — Z79899 Other long term (current) drug therapy: Secondary | ICD-10-CM | POA: Diagnosis not present

## 2018-09-05 DIAGNOSIS — Z87891 Personal history of nicotine dependence: Secondary | ICD-10-CM | POA: Insufficient documentation

## 2018-09-05 DIAGNOSIS — Z8541 Personal history of malignant neoplasm of cervix uteri: Secondary | ICD-10-CM | POA: Insufficient documentation

## 2018-09-05 DIAGNOSIS — K219 Gastro-esophageal reflux disease without esophagitis: Secondary | ICD-10-CM | POA: Insufficient documentation

## 2018-09-05 DIAGNOSIS — Z791 Long term (current) use of non-steroidal anti-inflammatories (NSAID): Secondary | ICD-10-CM | POA: Diagnosis not present

## 2018-09-05 DIAGNOSIS — E559 Vitamin D deficiency, unspecified: Secondary | ICD-10-CM | POA: Diagnosis not present

## 2018-09-05 DIAGNOSIS — E1140 Type 2 diabetes mellitus with diabetic neuropathy, unspecified: Secondary | ICD-10-CM | POA: Insufficient documentation

## 2018-09-05 DIAGNOSIS — Z7951 Long term (current) use of inhaled steroids: Secondary | ICD-10-CM | POA: Diagnosis not present

## 2018-09-05 DIAGNOSIS — S82832D Other fracture of upper and lower end of left fibula, subsequent encounter for closed fracture with routine healing: Secondary | ICD-10-CM | POA: Diagnosis not present

## 2018-09-05 DIAGNOSIS — F329 Major depressive disorder, single episode, unspecified: Secondary | ICD-10-CM | POA: Insufficient documentation

## 2018-09-05 DIAGNOSIS — Z1159 Encounter for screening for other viral diseases: Secondary | ICD-10-CM | POA: Diagnosis not present

## 2018-09-05 DIAGNOSIS — E785 Hyperlipidemia, unspecified: Secondary | ICD-10-CM | POA: Insufficient documentation

## 2018-09-05 DIAGNOSIS — Z419 Encounter for procedure for purposes other than remedying health state, unspecified: Secondary | ICD-10-CM

## 2018-09-05 HISTORY — PX: ORIF ANKLE FRACTURE: SHX5408

## 2018-09-05 LAB — GLUCOSE, CAPILLARY
Glucose-Capillary: 146 mg/dL — ABNORMAL HIGH (ref 70–99)
Glucose-Capillary: 206 mg/dL — ABNORMAL HIGH (ref 70–99)

## 2018-09-05 SURGERY — OPEN REDUCTION INTERNAL FIXATION (ORIF) ANKLE FRACTURE
Anesthesia: General | Site: Foot | Laterality: Left

## 2018-09-05 MED ORDER — ENSURE PRE-SURGERY PO LIQD: 296.0000 mL | Freq: Once | ORAL | Status: DC

## 2018-09-05 MED ORDER — ONDANSETRON HCL 4 MG/2ML IJ SOLN
INTRAMUSCULAR | Status: DC | PRN
  Administered 2018-09-05: 4 mg via INTRAVENOUS

## 2018-09-05 MED ORDER — LACTATED RINGERS IV SOLN
INTRAVENOUS | Status: DC
  Administered 2018-09-05: 08:00:00 via INTRAVENOUS

## 2018-09-05 MED ORDER — FENTANYL CITRATE (PF) 250 MCG/5ML IJ SOLN
INTRAMUSCULAR | Status: DC | PRN
  Administered 2018-09-05 (×3): 50 ug via INTRAVENOUS

## 2018-09-05 MED ORDER — PHENYLEPHRINE 40 MCG/ML (10ML) SYRINGE FOR IV PUSH (FOR BLOOD PRESSURE SUPPORT)
PREFILLED_SYRINGE | INTRAVENOUS | Status: DC | PRN
  Administered 2018-09-05: 80 ug via INTRAVENOUS
  Administered 2018-09-05: 160 ug via INTRAVENOUS

## 2018-09-05 MED ORDER — FENTANYL CITRATE (PF) 100 MCG/2ML IJ SOLN
INTRAMUSCULAR | Status: AC
  Administered 2018-09-05: 08:00:00 50 ug via INTRAVENOUS
  Filled 2018-09-05: qty 2

## 2018-09-05 MED ORDER — POVIDONE-IODINE 10 % EX SWAB: 2.0000 "application " | Freq: Once | CUTANEOUS | Status: DC

## 2018-09-05 MED ORDER — BUPIVACAINE-EPINEPHRINE (PF) 0.5% -1:200000 IJ SOLN
INTRAMUSCULAR | Status: DC | PRN
  Administered 2018-09-05: 10 mL via PERINEURAL
  Administered 2018-09-05: 25 mL via PERINEURAL

## 2018-09-05 MED ORDER — ONDANSETRON HCL 4 MG/2ML IJ SOLN
4.0000 mg | Freq: Once | INTRAMUSCULAR | Status: AC | PRN
  Administered 2018-09-05: 4 mg via INTRAVENOUS

## 2018-09-05 MED ORDER — OXYCODONE HCL 5 MG PO TABS: 5.0000 mg | ORAL_TABLET | ORAL | 0 refills | Status: AC | PRN

## 2018-09-05 MED ORDER — ASPIRIN 325 MG PO TABS: 325.0000 mg | ORAL_TABLET | Freq: Every day | ORAL | 11 refills | Status: AC

## 2018-09-05 MED ORDER — ONDANSETRON HCL 4 MG/2ML IJ SOLN
INTRAMUSCULAR | Status: AC
  Filled 2018-09-05: qty 2

## 2018-09-05 MED ORDER — OXYCODONE HCL 5 MG PO TABS
ORAL_TABLET | ORAL | Status: AC
  Filled 2018-09-05: qty 1

## 2018-09-05 MED ORDER — FENTANYL CITRATE (PF) 100 MCG/2ML IJ SOLN
50.0000 ug | Freq: Once | INTRAMUSCULAR | Status: AC
  Administered 2018-09-05: 08:00:00 50 ug via INTRAVENOUS

## 2018-09-05 MED ORDER — PROPOFOL 10 MG/ML IV BOLUS
INTRAVENOUS | Status: DC | PRN
  Administered 2018-09-05: 120 mg via INTRAVENOUS

## 2018-09-05 MED ORDER — 0.9 % SODIUM CHLORIDE (POUR BTL) OPTIME
TOPICAL | Status: DC | PRN
  Administered 2018-09-05: 1000 mL

## 2018-09-05 MED ORDER — MIDAZOLAM HCL 2 MG/2ML IJ SOLN
INTRAMUSCULAR | Status: DC | PRN
  Administered 2018-09-05: 2 mg via INTRAVENOUS

## 2018-09-05 MED ORDER — FENTANYL CITRATE (PF) 100 MCG/2ML IJ SOLN: 25.0000 ug | INTRAMUSCULAR | Status: DC | PRN

## 2018-09-05 MED ORDER — DEXAMETHASONE SODIUM PHOSPHATE 10 MG/ML IJ SOLN
INTRAMUSCULAR | Status: DC | PRN
  Administered 2018-09-05: 4 mg via INTRAVENOUS

## 2018-09-05 MED ORDER — FENTANYL CITRATE (PF) 250 MCG/5ML IJ SOLN
INTRAMUSCULAR | Status: AC
  Filled 2018-09-05: qty 5

## 2018-09-05 MED ORDER — SODIUM CHLORIDE 0.9 % IV SOLN
INTRAVENOUS | Status: DC | PRN
  Administered 2018-09-05: 09:00:00 50 ug/min via INTRAVENOUS

## 2018-09-05 MED ORDER — ACETAMINOPHEN 500 MG PO TABS
ORAL_TABLET | ORAL | Status: AC
  Administered 2018-09-05: 08:00:00 1000 mg via ORAL
  Filled 2018-09-05: qty 2

## 2018-09-05 MED ORDER — CEFAZOLIN SODIUM-DEXTROSE 2-4 GM/100ML-% IV SOLN
2.0000 g | INTRAVENOUS | Status: AC
  Administered 2018-09-05: 09:00:00 2 g via INTRAVENOUS
  Filled 2018-09-05: qty 100

## 2018-09-05 MED ORDER — MIDAZOLAM HCL 2 MG/2ML IJ SOLN
INTRAMUSCULAR | Status: AC
  Filled 2018-09-05: qty 2

## 2018-09-05 MED ORDER — MIDAZOLAM HCL 2 MG/2ML IJ SOLN
1.0000 mg | Freq: Once | INTRAMUSCULAR | Status: AC
  Administered 2018-09-05: 09:00:00 1 mg via INTRAVENOUS

## 2018-09-05 MED ORDER — ACETAMINOPHEN 500 MG PO TABS
1000.0000 mg | ORAL_TABLET | Freq: Once | ORAL | Status: AC
  Administered 2018-09-05: 08:00:00 1000 mg via ORAL

## 2018-09-05 MED ORDER — MIDAZOLAM HCL 2 MG/2ML IJ SOLN
INTRAMUSCULAR | Status: AC
  Administered 2018-09-05: 09:00:00 1 mg via INTRAVENOUS
  Filled 2018-09-05: qty 2

## 2018-09-05 MED ORDER — OXYCODONE HCL 5 MG PO TABS
5.0000 mg | ORAL_TABLET | ORAL | Status: DC | PRN
  Administered 2018-09-05: 5 mg via ORAL

## 2018-09-05 MED ORDER — LIDOCAINE 2% (20 MG/ML) 5 ML SYRINGE
INTRAMUSCULAR | Status: DC | PRN
  Administered 2018-09-05: 60 mg via INTRAVENOUS

## 2018-09-05 SURGICAL SUPPLY — 74 items
ALCOHOL 70% 16 OZ (MISCELLANEOUS) ×3 IMPLANT
BANDAGE ACE 4X5 VEL STRL LF (GAUZE/BANDAGES/DRESSINGS) ×2 IMPLANT
BANDAGE ESMARK 6X9 LF (GAUZE/BANDAGES/DRESSINGS) IMPLANT
BIT DRILL 2 CANN GRADUATED (BIT) ×2 IMPLANT
BIT DRILL 2.5 CANN LNG (BIT) ×2 IMPLANT
BIT DRILL 2.6 CANN (BIT) ×2 IMPLANT
BLADE SURG 15 STRL LF DISP TIS (BLADE) ×1 IMPLANT
BLADE SURG 15 STRL SS (BLADE) ×6
BNDG CMPR 9X6 STRL LF SNTH (GAUZE/BANDAGES/DRESSINGS)
BNDG CMPR MED 10X6 ELC LF (GAUZE/BANDAGES/DRESSINGS) ×1
BNDG COHESIVE 4X5 TAN STRL (GAUZE/BANDAGES/DRESSINGS) IMPLANT
BNDG COHESIVE 6X5 TAN STRL LF (GAUZE/BANDAGES/DRESSINGS) IMPLANT
BNDG ELASTIC 6X10 VLCR STRL LF (GAUZE/BANDAGES/DRESSINGS) ×3 IMPLANT
BNDG ESMARK 6X9 LF (GAUZE/BANDAGES/DRESSINGS)
CANISTER SUCT 3000ML PPV (MISCELLANEOUS) ×3 IMPLANT
CHLORAPREP W/TINT 26ML (MISCELLANEOUS) ×6 IMPLANT
COVER SURGICAL LIGHT HANDLE (MISCELLANEOUS) ×3 IMPLANT
COVER WAND RF STERILE (DRAPES) ×3 IMPLANT
CUFF TOURNIQUET SINGLE 34IN LL (TOURNIQUET CUFF) ×3 IMPLANT
CUFF TOURNIQUET SINGLE 44IN (TOURNIQUET CUFF) IMPLANT
DRAPE C-ARM 42X72 X-RAY (DRAPES) ×2 IMPLANT
DRAPE C-ARMOR (DRAPES) ×2 IMPLANT
DRAPE OEC MINIVIEW 54X84 (DRAPES) ×3 IMPLANT
DRAPE U-SHAPE 47X51 STRL (DRAPES) ×3 IMPLANT
DRSG MEPITEL 4X7.2 (GAUZE/BANDAGES/DRESSINGS) ×3 IMPLANT
DRSG PAD ABDOMINAL 8X10 ST (GAUZE/BANDAGES/DRESSINGS) ×6 IMPLANT
DRSG XEROFORM 1X8 (GAUZE/BANDAGES/DRESSINGS) ×3 IMPLANT
ELECT REM PT RETURN 9FT ADLT (ELECTROSURGICAL) ×3
ELECTRODE REM PT RTRN 9FT ADLT (ELECTROSURGICAL) ×1 IMPLANT
GAUZE SPONGE 4X4 12PLY STRL (GAUZE/BANDAGES/DRESSINGS) IMPLANT
GAUZE SPONGE 4X4 12PLY STRL LF (GAUZE/BANDAGES/DRESSINGS) ×3 IMPLANT
GAUZE XEROFORM 5X9 LF (GAUZE/BANDAGES/DRESSINGS) ×2 IMPLANT
GLOVE BIOGEL M STRL SZ7.5 (GLOVE) ×3 IMPLANT
GLOVE BIOGEL PI IND STRL 8 (GLOVE) ×1 IMPLANT
GLOVE BIOGEL PI INDICATOR 8 (GLOVE) ×2
GOWN STRL REUS W/ TWL LRG LVL3 (GOWN DISPOSABLE) ×1 IMPLANT
GOWN STRL REUS W/ TWL XL LVL3 (GOWN DISPOSABLE) ×1 IMPLANT
GOWN STRL REUS W/TWL LRG LVL3 (GOWN DISPOSABLE) ×3
GOWN STRL REUS W/TWL XL LVL3 (GOWN DISPOSABLE) ×6
GUIDEWIRE 1.35MM (WIRE) ×4 IMPLANT
KIT BASIN OR (CUSTOM PROCEDURE TRAY) ×3 IMPLANT
KIT TURNOVER KIT B (KITS) ×3 IMPLANT
NS IRRIG 1000ML POUR BTL (IV SOLUTION) ×3 IMPLANT
PACK ORTHO EXTREMITY (CUSTOM PROCEDURE TRAY) ×3 IMPLANT
PAD ABD 8X10 STRL (GAUZE/BANDAGES/DRESSINGS) ×4 IMPLANT
PAD ARMBOARD 7.5X6 YLW CONV (MISCELLANEOUS) ×6 IMPLANT
PAD CAST 4YDX4 CTTN HI CHSV (CAST SUPPLIES) ×1 IMPLANT
PADDING CAST COTTON 4X4 STRL (CAST SUPPLIES) ×3
PADDING CAST SYN 6 (CAST SUPPLIES) ×4
PADDING CAST SYNTHETIC 6X4 NS (CAST SUPPLIES) IMPLANT
PLATE LOCK DIST FIB 5H LT SS (Plate) ×2 IMPLANT
SCREW CANC FMS SD 4X40 (Screw) ×4 IMPLANT
SCREW CANCELLOUS 3X16MM (Screw) ×2 IMPLANT
SCREW LOCKING 2.7X10 ANKLE (Screw) ×2 IMPLANT
SCREW LOCKING 2.7X14MM (Screw) ×4 IMPLANT
SCREW LOW PROFILE 3.5X14 (Screw) ×4 IMPLANT
SCREW NLOCK CANC 2.7X12 (Miscellaneous) ×2 IMPLANT
SCREW NON-LOCKING 3.5X12MM (Screw) ×4 IMPLANT
SPLINT PLASTER CAST XFAST 5X30 (CAST SUPPLIES) IMPLANT
SPLINT PLASTER XFAST SET 5X30 (CAST SUPPLIES) ×4
SPONGE LAP 18X18 RF (DISPOSABLE) ×3 IMPLANT
STAPLER VISISTAT 35W (STAPLE) ×2 IMPLANT
SUCTION FRAZIER HANDLE 10FR (MISCELLANEOUS) ×2
SUCTION TUBE FRAZIER 10FR DISP (MISCELLANEOUS) ×1 IMPLANT
SUT ETHILON 3 0 PS 1 (SUTURE) ×3 IMPLANT
SUT MNCRL AB 3-0 PS2 18 (SUTURE) ×2 IMPLANT
SUT MON AB 5-0 PS2 18 (SUTURE) ×2 IMPLANT
SUT PDS AB 2-0 CT1 27 (SUTURE) ×4 IMPLANT
SUT VIC AB 2-0 CT1 27 (SUTURE)
SUT VIC AB 2-0 CT1 TAPERPNT 27 (SUTURE) ×2 IMPLANT
TOWEL GREEN STERILE FF (TOWEL DISPOSABLE) ×3 IMPLANT
TOWEL OR 17X26 10 PK STRL BLUE (TOWEL DISPOSABLE) ×3 IMPLANT
TUBE CONNECTING 12'X1/4 (SUCTIONS) ×1
TUBE CONNECTING 12X1/4 (SUCTIONS) ×2 IMPLANT

## 2018-09-05 NOTE — Anesthesia Postprocedure Evaluation (Signed)
Anesthesia Post Note  Patient: Maria [name]  Procedure(s) Performed: OPEN REDUCTION INTERNAL FIXATION TRIMALLEOLAR FRACTURE POSSIBLE SYNDEMOSIS (Left Foot)     Patient location during evaluation: PACU Anesthesia Type: General Level of consciousness: awake and alert, awake and oriented Pain management: pain level controlled Vital Signs Assessment: post-procedure vital signs reviewed and stable Respiratory status: spontaneous breathing, nonlabored ventilation, respiratory function stable and patient connected to nasal cannula oxygen Cardiovascular status: blood pressure returned to baseline, stable and tachycardic Postop Assessment: no apparent nausea or vomiting Anesthetic complications: no    Last Vitals:  Vitals:   09/05/18 1215 09/05/18 1230  BP: (!) 114/57   Pulse: 100 (!) 106  Resp: (!) 26 15  Temp:  (!) 36.3 C  SpO2: 99% 100%    Last Pain:  Vitals:   09/05/18 1045  TempSrc:   PainSc: 7                  Catalina Gravel

## 2018-09-05 NOTE — Op Note (Signed)
Maria [name] female 63 y.o. 09/05/2018  PreOperative Diagnosis: Left trimalleolar ankle fracture dislocation Left ankle intra-articular loose body  PostOperative Diagnosis: Same  PROCEDURE: Open treatment of left trimalleolar ankle fracture without posterior fixation Ankle arthrotomy and removal of osteochondral fragment  SURGEON: Melony Overly, MD  ASSISTANT: Angela Nevin, RNFA  ANESTHESIA: General anesthesia with popliteal and abductor canal blocks by anesthesia  FINDINGS: Left trimalleolar ankle fracture with loose osteochondral fragment within the anterior joint that originated from the interval between the posterior malleolar fragment and the intact tibial plafond.  Poor bone quality with comminution about the lateral malleolus fracture  IMPLANTS: Arthrex distal fibular locking plate Arthrex 4.0 mm cannulated screws  INDICATIONS:62 y.o. female sustained a left trimalleolar ankle fracture dislocation on August 22, 2018 when she slipped and fell.  She had a history of diabetes with an A1c of 7.4.  She denied neuropathy.  She was reduced in the emergency department and splinted.  She was seen in my office and CT scan was obtained which showed trimalleolar ankle fracture with small posterior malleolar piece and some impaction between the interval of the posterior malleolar fragment and the intact tibial plafond with a large piece of bony fragment within the anterior ankle joint centrally.  Given this she was indicated for open treatment of her ankle fracture dislocation and arthrotomy to remove the loose pieces of bone.  We discussed the risk, benefits and alternatives of surgery which included but were not limited to wound healing complications, infection, nonunion, malunion, need for further surgery, continued pain, development of arthritis.  We also discussed the anesthetic and perioperative risks which included death.  After weighing these risks he wished to proceed with  surgery.  PROCEDURE: Patient was identified in the preoperative holding area.  The left leg was marked by myself.  The consent was signed by myself and the patient.  Anesthesia performed a peripheral nerve blockade.  She was taken to the operative suite and placed supine operative table.  General anesthesia was induced without difficulty.  A thigh tourniquet was placed on the left thigh.  A bump was placed on the left hip and bone foam was used.  All bony prominences were well-padded.  Preoperative antibiotics were given and surgical timeout was performed.  Left lower extremity was prepped and draped in usual sterile fashion.  The left lower extremity was elevated and the tourniquet was inflated to 250 mmHg.  We made a longitudinal incision overlying the fibula distally.  This taken sharply down through skin and subcutaneous tissue and blunt dissection was used to identify a branch of the superficial peroneal nerve which were not found within the surgical field.  Anterior and posterior skin flaps were created.  The bone was identified and the fracture site was long oblique fracture of the distal fibula starting anteriorly distal to the syndesmosis and extending posteriorly and proximally.  There was comminution at the distal end of the anterior fragment.  Bone quality was poor.  The fracture site was opened up and fracture hematoma and fibrinous tissue was removed with a rondure.  A lamina spreader was placed between the anterior and posterior fragments tube visualize the posterior malleolar fragment.  Valora Corporal was placed within this area to identify the posterior impaction.  Some disimpaction of that portion was performed using a Freer and forceps.  There was a missing piece of cartilaginous portion of the intact tibial plafond posteriorly.  Then the distal fibular fracture was reduced under direct visualization and held in place  with a lobster claw.  We then turned our attention to the medial side.  An incision was  made overlying the medial malleolus and the fracture site there.  This was taken sharply down through skin and subcutaneous tissue.  Bovie cautery was used to cauterize skin bleeders.  Then anterior and posterior skin flaps were created.  The superficial deltoid ligament was intact.  The fracture site was difficult to identify and fluoroscopy was used which demonstrated acceptable reduction of the medial malleolus fragment with reduction of the lateral malleolus.  Therefore the fracture site was not opened up directly.  We did take the anterior skin flap more anterior and the joint capsule was identified.  This was incised and the anteromedial aspect of the ankle joint.  An arthrotomy was created and using retraction we able to visualize the anterior portion of the ankle joint there is a large osteochondral fragment noted there.  This was removed with a rondure.  The remaining talus that was visualized did not have any evidence of cartilaginous damage.  The visualized tibial plafond portion of the ankle joint was without cartilage injury.  It was decided that this piece had come from the interval between the intact tibial plafond and the posterior malleolar fragment that was missing posteriorly.  The small piece was too small to replace and there was concern that it would become a free-floating fragment within the joint.  It was discarded.  Then the medial malleolar fracture was again confirmed on fluoroscopy to be well reduced and two 4.0 mm cannulated screws were placed across the fracture site without difficulty.  There was good purchase and maintenance of reduction.  Then we turned our attention back to the lateral side.  The fracture site was re-reduced and compressed with a lobster claw.  A distal fibular locking plate was placed with a combination of locking and nonlocking screws.  Bone quality at the distal end the fibula was poor therefore locking screws were used.  The poor bone quality precluded using a  lag screw due to inadequate purchase.  Then the ankle was checked for stability.  There was good stability in the anterior and posterior directed forces about the ankle.  Syndesmosis was checked under direct visualization and radiographically.  It was stable.  Then the wounds were irrigated copiously with normal saline.  The tourniquet was released.  The deep tissue was closed with a 2-0 PDS.  The subcuticular tissue was closed with a 3-0 Monocryl and the skin with staples.  Xeroform was placed on the wounds.  4 x 4's and she cotton were placed.  She was placed in a nonweightbearing short leg splint that was well-padded. She was awake from anesthesia and taken recovery in stable condition.  The counts were correct in any case.  There were no complications.  POST OPERATIVE INSTRUCTIONS: Nonweightbearing to left lower extremity Keep splint dry Keep limb elevated and float heel Continue taking 325 mg aspirin daily for DVT prophylaxis She will return to clinic in 2 weeks for splint removal, nonweightbearing x-rays, removal of staples and placement of a nonweightbearing cast Call the office with concerns   TOURNIQUET TIME: 1 hour and 8 minutes  BLOOD LOSS:  Minimal         DRAINS: none         SPECIMEN: none       COMPLICATIONS:  * No complications entered in OR log *         Disposition: PACU - hemodynamically stable.  Condition: stable

## 2018-09-05 NOTE — Transfer of Care (Signed)
Immediate Anesthesia Transfer of Care Note  Patient: HALLI EQUIHUA  Procedure(s) Performed: OPEN REDUCTION INTERNAL FIXATION TRIMALLEOLAR FRACTURE POSSIBLE SYNDEMOSIS (Left Foot)  Patient Location: PACU  Anesthesia Type:GA combined with regional for post-op pain  Level of Consciousness: awake, alert  and oriented  Airway & Oxygen Therapy: Patient Spontanous Breathing and Patient connected to face mask oxygen  Post-op Assessment: Report given to RN and Post -op Vital signs reviewed and stable  Post vital signs: Reviewed and stable  Last Vitals:  Vitals Value Taken Time  BP 130/72 09/05/18 1042  Temp    Pulse 117 09/05/18 1044  Resp 14 09/05/18 1044  SpO2 100 % 09/05/18 1044  Vitals shown include unvalidated device data.  Last Pain:  Vitals:   09/05/18 0835  TempSrc:   PainSc: 0-No pain         Complications: No apparent anesthesia complications

## 2018-09-05 NOTE — Anesthesia Preprocedure Evaluation (Addendum)
Anesthesia Evaluation  Patient identified by MRN, date of birth, ID band Patient awake    Reviewed: Allergy & Precautions, NPO status , Patient's Chart, lab work & pertinent test results  History of Anesthesia Complications (+) history of anesthetic complications  Airway Mallampati: II  TM Distance: >3 FB Neck ROM: Full    Dental  (+) Dental Advisory Given, Partial Upper, Missing   Pulmonary asthma , former smoker,    Pulmonary exam normal breath sounds clear to auscultation       Cardiovascular hypertension, Pt. on medications Normal cardiovascular exam Rhythm:Regular Rate:Normal     Neuro/Psych PSYCHIATRIC DISORDERS Anxiety Depression  Neuromuscular disease    GI/Hepatic Neg liver ROS, GERD  Medicated and Controlled,  Endo/Other  diabetes, Type 2, Oral Hypoglycemic Agents, Insulin DependentHypothyroidism   Renal/GU negative Renal ROS     Musculoskeletal  LEFT TRIMALLEOLAR ANKLE FRACTURE   Abdominal   Peds  Hematology negative hematology ROS (+)   Anesthesia Other Findings Day of surgery medications reviewed with the patient.  Reproductive/Obstetrics                            Anesthesia Physical Anesthesia Plan  ASA: II  Anesthesia Plan: General   Post-op Pain Management:  Regional for Post-op pain   Induction: Intravenous  PONV Risk Score and Plan: 3 and Midazolam, Ondansetron and Dexamethasone  Airway Management Planned: LMA  Additional Equipment:   Intra-op Plan:   Post-operative Plan: Extubation in OR  Informed Consent: I have reviewed the patients History and Physical, chart, labs and discussed the procedure including the risks, benefits and alternatives for the proposed anesthesia with the patient or authorized representative who has indicated his/her understanding and acceptance.     Dental advisory given  Plan Discussed with: CRNA  Anesthesia Plan Comments:  (Risks/benefits of general anesthesia discussed with patient including risk of damage to teeth, lips, gum, and tongue, nausea/vomiting, allergic reactions to medications, and the possibility of heart attack, stroke and death.  All patient questions answered.  Patient wishes to proceed.)        Anesthesia Quick Evaluation

## 2018-09-05 NOTE — Anesthesia Procedure Notes (Signed)
Anesthesia Regional Block: Adductor canal block   Pre-Anesthetic Checklist: ,, timeout performed, Correct Patient, Correct Site, Correct Laterality, Correct Procedure, Correct Position, site marked, Risks and benefits discussed,  Surgical consent,  Pre-op evaluation,  At surgeon's request and post-op pain management  Laterality: Left  Prep: chloraprep       Needles:  Injection technique: Single-shot  Needle Type: Echogenic Needle     Needle Length: 9cm  Needle Gauge: 21     Additional Needles:   Procedures:,,,, ultrasound used (permanent image in chart),,,,  Narrative:  Start time: 09/05/2018 8:31 AM End time: 09/05/2018 8:36 AM Injection made incrementally with aspirations every 5 mL.  Performed by: Personally  Anesthesiologist: Catalina Gravel, MD  Additional Notes: No pain on injection. No increased resistance to injection. Injection made in 5cc increments.  Good needle visualization.  Patient tolerated procedure well.

## 2018-09-05 NOTE — H&P (Addendum)
Maria [name] is an 63 y.o. female.   Chief Complaint: Left trimalleolar ankle fracture with intra-articular loose body HPI: Patient is here today for surgical intervention of her left ankle including open treatment of her trimalleolar ankle fracture and arthrotomy for removal of loose bodies with possible syndesmotic fixation.  Patient sustained an ankle fracture on 08/22/2018 when she fell.  She was seen in the emergency department where closed reduction was performed.  She is indicated for surgery based on the displacement and nature of her injury.  She has been maintaining nonweightbearing.  She denies any recent fevers or chills.  She is been taking pain medication and her pain is controlled with that.  She has been elevating her limb.  She does have a history of diabetes with most recent A1c of 7.4.  She understands the risk, benefits and alternatives to surgery including the increased risk of overall complications being diabetic.  She wishes to proceed with surgery.  She has been using 1 325 mg aspirin for DVT prophylaxis since I saw her in clinic.  Past Medical History:  Diagnosis Date  . Allergy   . Asthma   . Cancer ([place])    cervical  . Complication of anesthesia    " I woke and was short of breath."  . CTS (carpal tunnel syndrome)   . Diabetes mellitus without complication ([place])   . Early cataracts, bilateral   . Fracture    trimalleolar ankle left  . GERD (gastroesophageal reflux disease)   . Glaucoma   . Glaucoma   . History of kidney stones    " I passed it"  . Hyperlipidemia   . Hypertension   . Hypothyroidism   . Neuropathy   . Vitamin D deficiency   . Wears glasses     Past Surgical History:  Procedure Laterality Date  . ABDOMINAL HYSTERECTOMY    . APPENDECTOMY    . CARDIAC CATHETERIZATION  2006   negative  . COLONOSCOPY  2009   negative  . INNER EAR SURGERY Right 1981  . left shoulder surgery Left 10/2009  . TONSILLECTOMY    . TUBAL LIGATION      Family  History  Problem Relation Age of Onset  . Diabetes Mother   . Prostate cancer Father   . Heart disease Father   . Heart attack Other    Social History:  reports that she quit smoking about 17 years ago. Her smoking use included cigarettes. She has a 15.00 pack-year smoking history. She has never used smokeless tobacco. She reports that she does not drink alcohol or use drugs.  Allergies: No Known Allergies  Medications Prior to Admission  Medication Sig Dispense Refill  . acetaminophen (TYLENOL) 500 MG tablet Take 1,000 mg by mouth every 6 (six) hours as needed for moderate pain.    Marland Kitchen albuterol (PROAIR HFA) 108 (90 Base) MCG/ACT inhaler Inhale 2 puffs into the lungs every 6 (six) hours as needed for wheezi ng or shortness of breath. (Patient taking differently: Inhale 2 puffs into the lungs every 6 (six) hours as needed for wheezing or shortness of breath. ) 8.5 g 2  . albuterol (PROVENTIL) (2.5 MG/3ML) 0.083% nebulizer solution USE 1 VIAL IN NEBULIZER EVERY 4 HOURS AS NEEDED FOR WHEEZING (Patient taking differently: Take 2.5 mg by nebulization every 4 (four) hours as needed for wheezing or shortness of breath. ) 75 mL 0  . ALPRAZolam (XANAX) 1 MG tablet TAKE 1 TABLET AT BEDTIME AS NEEDED FOR  SLEEP (Patient taking differently: Take 1 mg by mouth at bedtime. ) 30 tablet 5  . aspirin EC 81 MG tablet Take 81 mg by mouth daily.    . Fluticasone-Salmeterol (ADVAIR DISKUS) 500-50 MCG/DOSE AEPB 1 PUFF TWICE DAILY. RINSE MOUTH WITH WATER AFTER EACH USE. (Patient taking differently: Inhale 1 puff into the lungs 2 (two) times a day. ) 60 each 2  . glipiZIDE (GLIPIZIDE XL) 10 MG 24 hr tablet Take 1 tablet (10 mg total) by mouth daily with breakfast. 90 tablet 1  . ibuprofen (ADVIL) 200 MG tablet Take 400 mg by mouth every 6 (six) hours as needed for moderate pain.    . Insulin Glargine (LANTUS SOLOSTAR) 100 UNIT/ML Solostar Pen INJECT 42 UNITS SQ EVERY EVENING FOR DIABETES (Patient taking differently:  Inject 38-40 Units into the skin at bedtime. ) 15 mL 5  . ketoconazole (NIZORAL) 2 % cream Apply bid to rash (Patient taking differently: Apply 1 application topically 2 (two) times daily as needed (rash). ) 30 g 0  . levothyroxine (SYNTHROID, LEVOTHROID) 75 MCG tablet Take 1 tablet (75 mcg total) by mouth daily. 90 tablet 1  . lisinopril (PRINIVIL,ZESTRIL) 5 MG tablet TAKE (1) TABLET DAILY FOR HIGH BLOOD PRESSURE. (Patient taking differently: Take 5 mg by mouth daily. ) 90 tablet 1  . LUMIGAN 0.01 % SOLN Place 1 drop into both eyes at bedtime.     . metFORMIN (GLUCOPHAGE) 500 MG tablet TAKE 2 TABLETS 2 TIMES A DAY WITH MEALS (Patient taking differently: Take 1,000 mg by mouth daily. ) 360 tablet 5  . mometasone (ELOCON) 0.1 % cream Apply 1 application topically daily. (Patient taking differently: Apply 1 application topically daily as needed (rash). ) 45 g 0  . montelukast (SINGULAIR) 10 MG tablet Take 1 tablet (10 mg total) by mouth daily. 90 tablet 1  . omeprazole (PRILOSEC) 40 MG capsule Take 1 capsule (40 mg total) by mouth daily. 90 capsule 0  . ondansetron (ZOFRAN ODT) 4 MG disintegrating tablet Take 1 tablet (4 mg total) by mouth every 8 (eight) hours as needed for nausea or vomiting. (Patient taking differently: Take 4 mg by mouth every 6 (six) hours as needed for nausea or vomiting. ) 12 tablet 0  . phenylephrine (SUDAFED PE) 10 MG TABS tablet Take 10 mg by mouth every 4 (four) hours as needed (sinus congestion).    . rosuvastatin (CRESTOR) 20 MG tablet Take 1 tablet (20 mg total) by mouth daily. 90 tablet 1  . triamcinolone cream (KENALOG) 0.1 % APPLY TO AFFECTED AREAS 2 TIMES A DAY AS NEEDED (Patient taking differently: Apply 1 application topically 2 (two) times daily as needed (dry skin). ) 60 g 5  . ULTICARE SHORT PEN NEEDLES 31G X 8 MM MISC USE AS DIRECTED 100 each 5  . HYDROmorphone (DILAUDID) 2 MG tablet Take 1 tablet (2 mg total) by mouth every 6 (six) hours as needed for severe  pain. (Patient not taking: Reported on 09/03/2018) 20 tablet 0    Results for orders placed or performed during the hospital encounter of 09/04/18 (from the past 48 hour(s))  Glucose, capillary     Status: Abnormal   Collection Time: 09/04/18 11:01 AM  Result Value Ref Range   Glucose-Capillary 136 (H) 70 - 99 mg/dL  Surgical pcr screen     Status: None   Collection Time: 09/04/18 11:45 AM   Specimen: Nasal Mucosa; Nasal Swab  Result Value Ref Range   MRSA, PCR NEGATIVE NEGATIVE  Staphylococcus aureus NEGATIVE NEGATIVE    Comment: (NOTE) The Xpert SA Assay (FDA approved for NASAL specimens in patients 40 years of age and older), is one component of a comprehensive surveillance program. It is not intended to diagnose infection nor to guide or monitor treatment. Performed at [facility], [place] [address], [place], [place] [address]   Basic metabolic panel     Status: Abnormal   Collection Time: 09/04/18 11:46 AM  Result Value Ref Range   Sodium 136 135 - 145 mmol/L   Potassium 4.0 3.5 - 5.1 mmol/L   Chloride 104 98 - 111 mmol/L   CO2 22 22 - 32 mmol/L   Glucose, Bld 134 (H) 70 - 99 mg/dL   BUN 18 8 - 23 mg/dL   Creatinine, Ser 0.93 0.44 - 1.00 mg/dL   Calcium 9.2 8.9 - 10.3 mg/dL   GFR calc non Af Amer >60 >60 mL/min   GFR calc Af Amer >60 >60 mL/min   Anion gap 10 5 - 15    Comment: Performed at [facility], [place] [address], [place], [place] [address]  CBC     Status: None   Collection Time: 09/04/18 11:46 AM  Result Value Ref Range   WBC 6.7 4.0 - 10.5 K/uL   RBC 4.26 3.87 - 5.11 MIL/uL   Hemoglobin 13.1 12.0 - 15.0 g/dL   HCT 39.0 36.0 - 46.0 %   MCV 91.5 80.0 - 100.0 fL   MCH 30.8 26.0 - 34.0 pg   MCHC 33.6 30.0 - 36.0 g/dL   RDW 13.0 11.5 - 15.5 %   Platelets 305 150 - 400 K/uL   nRBC 0.0 0.0 - 0.2 %    Comment: Performed at [facility], [place] [address], [place], [place] [address]  Hemoglobin A1c     Status: Abnormal   Collection Time: 09/04/18  12:24 PM  Result Value Ref Range   Hgb A1c MFr Bld 7.3 (H) 4.8 - 5.6 %    Comment: (NOTE) Pre diabetes:          5.7%-6.4% Diabetes:              >6.4% Glycemic control for   <7.0% adults with diabetes    Mean Plasma Glucose 162.81 mg/dL    Comment: Performed at [facility] [address], [place], [place] [address]  SARS Coronavirus 2 (CEPHEID - Performed in [place] hospital lab), Hosp Order     Status: None   Collection Time: 09/04/18 12:30 PM   Specimen: Nasopharyngeal Swab  Result Value Ref Range   SARS Coronavirus 2 NEGATIVE NEGATIVE    Comment: (NOTE) If result is NEGATIVE SARS-CoV-2 target nucleic acids are NOT DETECTED. The SARS-CoV-2 RNA is generally detectable in upper and lower  respiratory specimens during the acute phase of infection. The lowest  concentration of SARS-CoV-2 viral copies this assay can detect is 250  copies / mL. A negative result does not preclude SARS-CoV-2 infection  and should not be used as the sole basis for treatment or other  patient management decisions.  A negative result may occur with  improper specimen collection / handling, submission of specimen other  than nasopharyngeal swab, presence of viral mutation(s) within the  areas targeted by this assay, and inadequate number of viral copies  (<250 copies / mL). A negative result must be combined with clinical  observations, patient history, and epidemiological information. If result is POSITIVE SARS-CoV-2 target nucleic  acids are DETECTED. The SARS-CoV-2 RNA is generally detectable in upper and lower  respiratory specimens dur ing the acute phase of infection.  Positive  results are indicative of active infection with SARS-CoV-2.  Clinical  correlation with patient history and other diagnostic information is  necessary to determine patient infection status.  Positive results do  not rule out bacterial infection or co-infection with other viruses. If result is PRESUMPTIVE  POSTIVE SARS-CoV-2 nucleic acids MAY BE PRESENT.   A presumptive positive result was obtained on the submitted specimen  and confirmed on repeat testing.  While 2019 novel coronavirus  (SARS-CoV-2) nucleic acids may be present in the submitted sample  additional confirmatory testing may be necessary for epidemiological  and / or clinical management purposes  to differentiate between  SARS-CoV-2 and other Sarbecovirus currently known to infect humans.  If clinically indicated additional testing with an alternate test  methodology [phone]) is advised. The SARS-CoV-2 RNA is generally  detectable in upper and lower respiratory sp ecimens during the acute  phase of infection. The expected result is Negative. Fact Sheet for Patients:  StrictlyIdeas.no Fact Sheet for Healthcare Providers: BankingDealers.co.za This test is not yet approved or cleared by the [place] FDA and has been authorized for detection and/or diagnosis of SARS-CoV-2 by FDA under an Emergency Use Authorization (EUA).  This EUA will remain in effect (meaning this test can be used) for the duration of the COVID-19 declaration under Section 564(b)(1) of the Act, 21 U.S.C. section 360bbb-3(b)(1), unless the authorization is terminated or revoked sooner. Performed at [facility], [place] [address] [place], [place] [address]    No results found.  Review of Systems  Constitutional: Negative.   HENT: Negative.   Eyes: Negative.   Respiratory: Negative.   Cardiovascular: Negative.   Gastrointestinal: Negative.   Musculoskeletal:       Left ankle pain  Skin: Negative.   Neurological: Negative.   Psychiatric/Behavioral: Negative.     Blood pressure 135/63, pulse 98, temperature 97.8 F (36.6 C), temperature source Oral, resp. rate 18, height 5\' 2"  (1.575 m), weight 70.5 kg, SpO2 99 %. Physical Exam  Constitutional: She appears well-developed.   HENT:  Head: Normocephalic.  Eyes: Conjunctivae are normal.  Neck: Neck supple.  Cardiovascular: Normal rate.  Respiratory: Effort normal.  GI: Soft.  Musculoskeletal:     Comments: Left leg in a short leg splint.  Toes exposed are warm and well-perfused.  Endorses sensation of her toes plantarly and dorsally.  Attempted to palpate pulse under the splint with but was unable to.  No tenderness proximal to the splint.  No other evidence of extremity injury.  Neurological: She is alert.  Skin: Skin is warm.  Psychiatric: She has a normal mood and affect.     Assessment/Plan We will proceed with open treatment of her left trimalleolar ankle fracture with ankle arthrotomy for removal of loose bodies and possible syndesmotic fixation.  She understands the risk, benefits and alternatives which were discussed and include but were not limited to wound healing complications, infection, need for further surgery, malunion, nonunion, demonstrating structures.  We also discussed the potential for developing posttraumatic arthritis or continued pain.  She also understands the perioperative and anesthetic risk which include death.  She wishes to proceed.  She understands the postoperative weightbearing restrictions and wishes to proceed.    Erle Crocker, MD 09/05/2018, 8:36 AM

## 2018-09-05 NOTE — Anesthesia Procedure Notes (Signed)
Procedure Name: LMA Insertion Date/Time: 09/05/2018 8:56 AM Performed by: Marsa Aris, CRNA Pre-anesthesia Checklist: Patient identified, Emergency Drugs available, Suction available and Patient being monitored Patient Re-evaluated:Patient Re-evaluated prior to induction Oxygen Delivery Method: Circle System Utilized Preoxygenation: Pre-oxygenation with 100% oxygen Induction Type: IV induction Ventilation: Mask ventilation without difficulty LMA: LMA inserted LMA Size: 4.0 Number of attempts: 1 Airway Equipment and Method: Bite block Placement Confirmation: positive ETCO2 Tube secured with: Tape Dental Injury: Teeth and Oropharynx as per pre-operative assessment  Comments: No change in dentition from preprocedure

## 2018-09-05 NOTE — Discharge Instructions (Signed)
DR. Lucia Gaskins FOOT & ANKLE SURGERY POST-OP INSTRUCTIONS   Pain Management 1. The numbing medicine and your leg will last around 8 hours, take a dose of your pain medicine as soon as you feel it wearing off to avoid rebound pain. 2. Keep your foot elevated above heart level.  Make sure that your heel hangs free ('floats'). 3. Take all prescribed medication as directed. 4. If taking narcotic pain medication you may want to use an over-the-counter stool softener to avoid constipation. 5. You may take over-the-counter NSAIDs (ibuprofen, naproxen, etc.) as well as over-the-counter acetaminophen as directed on the packaging as a supplement for your pain and may also use it to wean away from the prescription medication.  Activity ? Non-weightbearing ? Postoperatively, you will be placed into a splint which stays on for 2 weeks and then will be changed at your first postop visit.  First Postoperative Visit 1. Your first postop visit will be at least 2 weeks after surgery.  This should be scheduled when you schedule surgery. 2. If you do not have a postoperative visit scheduled please call [phone] to schedule an appointment. 3. At the appointment your incision will be evaluated for suture removal, x-rays will be obtained if necessary.  General Instructions 1. Swelling is very common after foot and ankle surgery.  It often takes 3 months for the foot and ankle to begin to feel comfortable.  Some amount of swelling will persist for 6-12 months. 2. DO NOT change the dressing.  If there is a problem with the dressing (too tight, loose, gets wet, etc.) please contact Dr. Pollie Friar office. 3. DO NOT get the dressing wet.  For showers you can use an over-the-counter cast cover or wrap a washcloth around the top of your dressing and then cover it with a plastic bag and tape it to your leg. 4. DO NOT soak the incision (no tubs, pools, bath, etc.) until you have approval from Dr. Lucia Gaskins.  Contact Dr. Huel Cote  office or go to Emergency Room if: 1. Temperature above 101 F. 2. Increasing pain that is unresponsive to pain medication or elevation 3. Excessive redness or swelling in your foot 4. Dressing problems - excessive bloody drainage, looseness or tightness, or if dressing gets wet 5. Develop pain, swelling, warmth, or discoloration of your calf

## 2018-09-05 NOTE — Anesthesia Procedure Notes (Signed)
Anesthesia Regional Block: Popliteal block   Pre-Anesthetic Checklist: ,, timeout performed, Correct Patient, Correct Site, Correct Laterality, Correct Procedure, Correct Position, site marked, Risks and benefits discussed,  Surgical consent,  Pre-op evaluation,  At surgeon's request and post-op pain management  Laterality: Left  Prep: chloraprep       Needles:  Injection technique: Single-shot  Needle Type: Echogenic Needle     Needle Length: 9cm  Needle Gauge: 21     Additional Needles:   Procedures:,,,, ultrasound used (permanent image in chart),,,,  Narrative:  Start time: 09/05/2018 8:25 AM End time: 09/05/2018 8:30 AM Injection made incrementally with aspirations every 5 mL.  Performed by: Personally  Anesthesiologist: Catalina Gravel, MD  Additional Notes: No pain on injection. No increased resistance to injection. Injection made in 5cc increments.  Good needle visualization.  Patient tolerated procedure well.

## 2018-09-09 ENCOUNTER — Encounter (HOSPITAL_COMMUNITY): Payer: Self-pay | Admitting: Orthopaedic Surgery

## 2018-09-18 DIAGNOSIS — S82842A Displaced bimalleolar fracture of left lower leg, initial encounter for closed fracture: Secondary | ICD-10-CM | POA: Diagnosis not present

## 2018-09-18 DIAGNOSIS — S82842D Displaced bimalleolar fracture of left lower leg, subsequent encounter for closed fracture with routine healing: Secondary | ICD-10-CM | POA: Diagnosis not present

## 2018-10-16 DIAGNOSIS — S82842A Displaced bimalleolar fracture of left lower leg, initial encounter for closed fracture: Secondary | ICD-10-CM | POA: Diagnosis not present

## 2018-10-28 ENCOUNTER — Other Ambulatory Visit (HOSPITAL_COMMUNITY): Payer: Self-pay | Admitting: Family Medicine

## 2018-10-28 DIAGNOSIS — Z1231 Encounter for screening mammogram for malignant neoplasm of breast: Secondary | ICD-10-CM

## 2018-11-18 ENCOUNTER — Other Ambulatory Visit: Payer: Self-pay | Admitting: Family Medicine

## 2018-11-21 ENCOUNTER — Other Ambulatory Visit: Payer: Self-pay | Admitting: Family Medicine

## 2018-11-27 DIAGNOSIS — S82842D Displaced bimalleolar fracture of left lower leg, subsequent encounter for closed fracture with routine healing: Secondary | ICD-10-CM | POA: Diagnosis not present

## 2018-12-04 ENCOUNTER — Ambulatory Visit: Payer: Medicare Other | Admitting: Family Medicine

## 2018-12-05 ENCOUNTER — Encounter: Payer: Self-pay | Admitting: Family Medicine

## 2018-12-05 ENCOUNTER — Ambulatory Visit (INDEPENDENT_AMBULATORY_CARE_PROVIDER_SITE_OTHER): Payer: Medicare Other | Admitting: Family Medicine

## 2018-12-05 VITALS — Wt 157.0 lb

## 2018-12-05 DIAGNOSIS — E1140 Type 2 diabetes mellitus with diabetic neuropathy, unspecified: Secondary | ICD-10-CM | POA: Diagnosis not present

## 2018-12-05 DIAGNOSIS — E785 Hyperlipidemia, unspecified: Secondary | ICD-10-CM | POA: Diagnosis not present

## 2018-12-05 DIAGNOSIS — E039 Hypothyroidism, unspecified: Secondary | ICD-10-CM

## 2018-12-05 DIAGNOSIS — I10 Essential (primary) hypertension: Secondary | ICD-10-CM | POA: Diagnosis not present

## 2018-12-05 DIAGNOSIS — Z794 Long term (current) use of insulin: Secondary | ICD-10-CM

## 2018-12-05 DIAGNOSIS — Z79899 Other long term (current) drug therapy: Secondary | ICD-10-CM

## 2018-12-05 MED ORDER — LANTUS SOLOSTAR 100 UNIT/ML ~~LOC~~ SOPN: PEN_INJECTOR | SUBCUTANEOUS | 5 refills | Status: DC

## 2018-12-05 MED ORDER — GLIPIZIDE ER 10 MG PO TB24: 10.0000 mg | ORAL_TABLET | Freq: Every day | ORAL | 1 refills | Status: DC

## 2018-12-05 MED ORDER — LISINOPRIL 5 MG PO TABS: ORAL_TABLET | ORAL | 1 refills | Status: DC

## 2018-12-05 MED ORDER — OMEPRAZOLE 40 MG PO CPDR: 40.0000 mg | DELAYED_RELEASE_CAPSULE | Freq: Every day | ORAL | 1 refills | Status: DC

## 2018-12-05 MED ORDER — MONTELUKAST SODIUM 10 MG PO TABS: 10.0000 mg | ORAL_TABLET | Freq: Every day | ORAL | 1 refills | Status: DC

## 2018-12-05 MED ORDER — ROSUVASTATIN CALCIUM 20 MG PO TABS: 20.0000 mg | ORAL_TABLET | Freq: Every day | ORAL | 1 refills | Status: DC

## 2018-12-05 MED ORDER — ULTICARE SHORT PEN NEEDLES 31G X 8 MM MISC: 5 refills | Status: DC

## 2018-12-05 MED ORDER — ALPRAZOLAM 1 MG PO TABS: 1.0000 mg | ORAL_TABLET | Freq: Every evening | ORAL | 5 refills | Status: DC | PRN

## 2018-12-05 MED ORDER — METFORMIN HCL 500 MG PO TABS: ORAL_TABLET | ORAL | 5 refills | Status: DC

## 2018-12-05 MED ORDER — LEVOTHYROXINE SODIUM 75 MCG PO TABS: 75.0000 ug | ORAL_TABLET | Freq: Every day | ORAL | 1 refills | Status: DC

## 2018-12-05 NOTE — Progress Notes (Signed)
   Subjective:    Patient ID: Maria [name], female    DOB: 11-07-1955, 63 y.o.   MRN: [id number]  Diabetes She presents for her follow-up diabetic visit. She has type 2 diabetes mellitus. There are no hypoglycemic associated symptoms. There are no diabetic associated symptoms. There are no hypoglycemic complications. There are no diabetic complications. She sees a podiatrist (due to broken ankle).Eye exam is current.  Pt checks sugars BID. Pt states her sugars in the morning have been good; sometimes during the day her sugars will be between 189-230  Virtual Visit via Video Note  I connected with Maria [name] on 12/05/18 at  1:10 PM EDT by a video enabled telemedicine application and verified that I am speaking with the correct person using two identifiers.  Location: Patient: home Provider: office   I discussed the limitations of evaluation and management by telemedicine and the availability of in person appointments. The patient expressed understanding and agreed to proceed.  History of Present Illness:    Observations/Objective:   Assessment and Plan:   Follow Up Instructions:    I discussed the assessment and treatment plan with the patient. The patient was provided an opportunity to ask questions and all were answered. The patient agreed with the plan and demonstrated an understanding of the instructions.   The patient was advised to call back or seek an in-person evaluation if the symptoms worsen or if the condition fails to improve as anticipated.  I provided 25 minutes of non-face-to-face time during this encounter. Results for orders placed or performed during the hospital encounter of 09/05/18  Glucose, capillary  Result Value Ref Range   Glucose-Capillary 206 (H) 70 - 99 mg/dL  Glucose, capillary  Result Value Ref Range   Glucose-Capillary 146 (H) 70 - 99 mg/dL     Maria Males, LPN  Patient claims compliance with diabetes medication. No obvious side  effects. Reports no substantial low sugar spells. Most numbers are generally in good range when checked fasting. Generally does not miss a dose of medication. Watching diabetic diet closely  Blood pressure medicine and blood pressure levels reviewed today with patient. Compliant with blood pressure medicine. States does not miss a dose. No obvious side effects. Blood pressure generally good when checked elsewhere. Watching salt intake.   Patient continues to take lipid medication regularly. No obvious side effects from it. Generally does not miss a dose. Prior blood work results are reviewed with patient. Patient continues to work on fat intake in diet  Patient reports compliant with thyroid medication.  No symptoms of high or low thyroid.  Does not miss a dose.  Awaiting blood work   Review of Systems No headache, no major weight loss or weight gain, no chest pain no back pain abdominal pain no change in bowel habits complete ROS otherwise negative     Objective:   Physical Exam  Virtual      Assessment & Plan:  Impression #1 type 2 diabetes exact status uncertain discussed to maintain same meds pending blood work  2.  Hyperlipidemia.  Exact status uncertain.  Compliance discussed diet discussed maintain meds pending blood work  3.  Essential hypertension.  Control good in past on current meds to maintain same  4.  Hypothyroidism status uncertain  Diet exercise discussed.  Flu shot encouraged blood work reviewed

## 2018-12-13 ENCOUNTER — Other Ambulatory Visit: Payer: Self-pay

## 2018-12-14 ENCOUNTER — Other Ambulatory Visit (INDEPENDENT_AMBULATORY_CARE_PROVIDER_SITE_OTHER): Payer: Medicare Other

## 2018-12-14 DIAGNOSIS — Z23 Encounter for immunization: Secondary | ICD-10-CM

## 2019-01-29 ENCOUNTER — Encounter: Payer: Self-pay | Admitting: Family Medicine

## 2019-01-29 DIAGNOSIS — H2513 Age-related nuclear cataract, bilateral: Secondary | ICD-10-CM | POA: Diagnosis not present

## 2019-01-29 DIAGNOSIS — H401132 Primary open-angle glaucoma, bilateral, moderate stage: Secondary | ICD-10-CM | POA: Diagnosis not present

## 2019-01-29 DIAGNOSIS — E113392 Type 2 diabetes mellitus with moderate nonproliferative diabetic retinopathy without macular edema, left eye: Secondary | ICD-10-CM | POA: Diagnosis not present

## 2019-01-29 DIAGNOSIS — E113291 Type 2 diabetes mellitus with mild nonproliferative diabetic retinopathy without macular edema, right eye: Secondary | ICD-10-CM | POA: Diagnosis not present

## 2019-01-29 LAB — HM DIABETES EYE EXAM

## 2019-02-03 DIAGNOSIS — Z79899 Other long term (current) drug therapy: Secondary | ICD-10-CM | POA: Diagnosis not present

## 2019-02-03 DIAGNOSIS — E039 Hypothyroidism, unspecified: Secondary | ICD-10-CM | POA: Diagnosis not present

## 2019-02-03 DIAGNOSIS — Z794 Long term (current) use of insulin: Secondary | ICD-10-CM | POA: Diagnosis not present

## 2019-02-03 DIAGNOSIS — E785 Hyperlipidemia, unspecified: Secondary | ICD-10-CM | POA: Diagnosis not present

## 2019-02-03 DIAGNOSIS — E1140 Type 2 diabetes mellitus with diabetic neuropathy, unspecified: Secondary | ICD-10-CM | POA: Diagnosis not present

## 2019-02-04 LAB — HEMOGLOBIN A1C
Est. average glucose Bld gHb Est-mCnc: 114 mg/dL
Hgb A1c MFr Bld: 5.6 % (ref 4.8–5.6)

## 2019-02-04 LAB — MICROALBUMIN / CREATININE URINE RATIO
Creatinine, Urine: 76.7 mg/dL
Microalb/Creat Ratio: 26 mg/g creat (ref 0–29)
Microalbumin, Urine: 20 ug/mL

## 2019-02-04 LAB — HEPATIC FUNCTION PANEL
ALT: 15 IU/L (ref 0–32)
AST: 17 IU/L (ref 0–40)
Albumin: 4.3 g/dL (ref 3.8–4.8)
Alkaline Phosphatase: 134 IU/L — ABNORMAL HIGH (ref 39–117)
Bilirubin Total: 0.5 mg/dL (ref 0.0–1.2)
Bilirubin, Direct: 0.15 mg/dL (ref 0.00–0.40)
Total Protein: 6.7 g/dL (ref 6.0–8.5)

## 2019-02-04 LAB — LIPID PANEL
Chol/HDL Ratio: 2.8 ratio (ref 0.0–4.4)
Cholesterol, Total: 125 mg/dL (ref 100–199)
HDL: 44 mg/dL (ref >39)
LDL Chol Calc (NIH): 68 mg/dL (ref 0–99)
Triglycerides: 61 mg/dL (ref 0–149)
VLDL Cholesterol Cal: 13 mg/dL (ref 5–40)

## 2019-02-04 LAB — BASIC METABOLIC PANEL
BUN/Creatinine Ratio: 20 (ref 12–28)
BUN: 14 mg/dL (ref 8–27)
CO2: 24 mmol/L (ref 20–29)
Calcium: 8.9 mg/dL (ref 8.7–10.3)
Chloride: 105 mmol/L (ref 96–106)
Creatinine, Ser: 0.71 mg/dL (ref 0.57–1.00)
GFR calc Af Amer: 106 mL/min/{1.73_m2} (ref >59)
GFR calc non Af Amer: 92 mL/min/{1.73_m2} (ref >59)
Glucose: 79 mg/dL (ref 65–99)
Potassium: 4.1 mmol/L (ref 3.5–5.2)
Sodium: 142 mmol/L (ref 134–144)

## 2019-02-04 LAB — TSH: TSH: 0.632 u[IU]/mL (ref 0.450–4.500)

## 2019-02-09 ENCOUNTER — Encounter: Payer: Self-pay | Admitting: Family Medicine

## 2019-04-22 ENCOUNTER — Other Ambulatory Visit: Payer: Self-pay | Admitting: Family Medicine

## 2019-04-24 ENCOUNTER — Encounter: Payer: Self-pay | Admitting: Family Medicine

## 2019-05-02 ENCOUNTER — Other Ambulatory Visit: Payer: Self-pay

## 2019-05-02 ENCOUNTER — Ambulatory Visit (INDEPENDENT_AMBULATORY_CARE_PROVIDER_SITE_OTHER): Payer: Medicare Other | Admitting: Family Medicine

## 2019-05-02 DIAGNOSIS — Z20822 Contact with and (suspected) exposure to covid-19: Secondary | ICD-10-CM | POA: Diagnosis not present

## 2019-05-02 DIAGNOSIS — J019 Acute sinusitis, unspecified: Secondary | ICD-10-CM

## 2019-05-02 MED ORDER — AMOXICILLIN-POT CLAVULANATE 875-125 MG PO TABS: ORAL_TABLET | ORAL | 0 refills | Status: DC

## 2019-05-02 NOTE — Progress Notes (Signed)
   Subjective:  Audio only  Patient ID: Maria [name], female    DOB: [birth year], 64 y.o.   MRN: [id number]  Headache  This is a new problem. Episode onset: one week. Associated symptoms comments: Sinus congestion, headache, ear pain, cough, some issue with breathing due to cough, runny nose, sneezing, no fever. Treatments tried: sinus medicine. The treatment provided no relief.   Virtual Visit via Telephone Note  I connected with Maria [name] on 05/02/19 at  9:30 AM EST by telephone and verified that I am speaking with the correct person using two identifiers.  Location: Patient: home Provider: office   I discussed the limitations, risks, security and privacy concerns of performing an evaluation and management service by telephone and the availability of in person appointments. I also discussed with the patient that there may be a patient responsible charge related to this service. The patient expressed understanding and agreed to proceed.   History of Present Illness:    Observations/Objective:   Assessment and Plan:   Follow Up Instructions:    I discussed the assessment and treatment plan with the patient. The patient was provided an opportunity to ask questions and all were answered. The patient agreed with the plan and demonstrated an understanding of the instructions.   The patient was advised to call back or seek an in-person evaluation if the symptoms worsen or if the condition fails to improve as anticipated.  I provided 22 minutes of non-face-to-face time during this encounter.    sinus drainage bad   Frontal headache  Runny nose n drainage   presssure in the cheeks  Took otc sinus meds   No fever  Not achey anywhere    Review of Systems  Neurological: Positive for headaches.       Objective:   Physical Exam  Virtual      Assessment & Plan:  Impression viral syndrome/rhinosinusitis.  Symptom care discussed.  Antibiotics prescribed.   Warning signs discussed.  Covid testing encouraged

## 2019-05-16 ENCOUNTER — Other Ambulatory Visit: Payer: Self-pay | Admitting: Family Medicine

## 2019-05-18 NOTE — Telephone Encounter (Signed)
Ok times one six mo f u in [place]

## 2019-05-19 ENCOUNTER — Other Ambulatory Visit: Payer: Self-pay | Admitting: Family Medicine

## 2019-05-19 NOTE — Telephone Encounter (Signed)
Ok six mo ck up next mo

## 2019-06-18 ENCOUNTER — Other Ambulatory Visit: Payer: Self-pay | Admitting: Family Medicine

## 2019-06-18 NOTE — Telephone Encounter (Signed)
Ok six mo 

## 2019-07-04 ENCOUNTER — Other Ambulatory Visit: Payer: Self-pay

## 2019-07-04 ENCOUNTER — Telehealth: Payer: Self-pay | Admitting: *Deleted

## 2019-07-04 ENCOUNTER — Telehealth (INDEPENDENT_AMBULATORY_CARE_PROVIDER_SITE_OTHER): Payer: Medicare Other | Admitting: Family Medicine

## 2019-07-04 ENCOUNTER — Telehealth: Payer: Self-pay | Admitting: Family Medicine

## 2019-07-04 DIAGNOSIS — Z79899 Other long term (current) drug therapy: Secondary | ICD-10-CM

## 2019-07-04 DIAGNOSIS — I10 Essential (primary) hypertension: Secondary | ICD-10-CM | POA: Diagnosis not present

## 2019-07-04 DIAGNOSIS — E785 Hyperlipidemia, unspecified: Secondary | ICD-10-CM

## 2019-07-04 DIAGNOSIS — Z794 Long term (current) use of insulin: Secondary | ICD-10-CM | POA: Diagnosis not present

## 2019-07-04 DIAGNOSIS — E1140 Type 2 diabetes mellitus with diabetic neuropathy, unspecified: Secondary | ICD-10-CM

## 2019-07-04 MED ORDER — PREDNISONE 10 MG PO TABS: ORAL_TABLET | ORAL | 0 refills | Status: DC

## 2019-07-04 MED ORDER — CEFPROZIL 500 MG PO TABS: ORAL_TABLET | ORAL | 0 refills | Status: DC

## 2019-07-04 NOTE — Telephone Encounter (Signed)
Lab work was ordered at [employer] telehealth visit. Patient notified and verbalized understanding.

## 2019-07-04 NOTE — Telephone Encounter (Signed)
[name], grahl are scheduled for a virtual visit with your provider today.    Just as we do with appointments in the office, we must obtain your consent to participate.  Your consent will be active for this visit and any virtual visit you may have with one of our providers in the next 365 days.    If you have a MyChart account, I can also send a copy of this consent to you electronically.  All virtual visits are billed to your insurance company just like a traditional visit in the office.  As this is a virtual visit, video technology does not allow for your provider to perform a traditional examination.  This may limit your provider's ability to fully assess your condition.  If your provider identifies any concerns that need to be evaluated in person or the need to arrange testing such as labs, EKG, etc, we will make arrangements to do so.    Although advances in technology are sophisticated, we cannot ensure that it will always work on either your end or our end.  If the connection with a video visit is poor, we may have to switch to a telephone visit.  With either a video or telephone visit, we are not always able to ensure that we have a secure connection.   I need to obtain your verbal consent now.   Are you willing to proceed with your visit today?   Maria [name] has provided verbal consent on 07/04/2019 for a virtual visit (video or telephone).   Patsy Lager, LPN [id number]  [id number]

## 2019-07-04 NOTE — Telephone Encounter (Signed)
Pt has her diabetes check up scheduled with Dr. Lovena Le for 08/05/2019, needs to know if she needs any blood work  Please advise & call pt

## 2019-07-04 NOTE — Progress Notes (Signed)
   Subjective:  Audio plus video  Patient ID: Maria [name], female    DOB: June 19, 1955, 64 y.o.   MRN: [id number]  HPI  Patient calls in today with compaints of sinus issues.  Patient states she is experiencing itchy eyes, runny nose, cough which is worse at night. No fever. Patient states allergies definitely seem to be worse. Has tried   Virtual Visit via Video Note  I connected with Maria [name] on 07/04/19 at 11:00 AM EDT by a video enabled telemedicine application and verified that I am speaking with the correct person using two identifiers.  Location: Patient: home Provider: office   I discussed the limitations of evaluation and management by telemedicine and the availability of in person appointments. The patient expressed understanding and agreed to proceed.  History of Present Illness:    Observations/Objective:   Assessment and Plan:   Follow Up Instructions:    I discussed the assessment and treatment plan with the patient. The patient was provided an opportunity to ask questions and all were answered. The patient agreed with the plan and demonstrated an understanding of the instructions.   The patient was advised to call back or seek an in-person evaluation if the symptoms worsen or if the condition fails to improve as anticipated.  I provided 23 minutes of non-face-to-face time during this encounter.   Patient continues to take lipid medication regularly. No obvious side effects from it. Generally does not miss a dose. Prior blood work results are re   Review of Systems No headache no chest pain no shortness of breath    Objective:   Physical Exam Virtual       Assessment & Plan:  Impression 1 probable rhinosinusitis with allergic rhinitis flare.  Short course of steroids.  This will worsen her sugar control somewhat.  Antibiotics prescribed warning signs discussed.  Started with allergies.  No one else sick at home.  2.  Chronic problems.   Today's visit was not meant for that however she does note type 2 diabetes patient states stable.  No A1c recently went ahead and ordered also will check lipid profile with having being treated for hyperlipidemia.  Chronic visit checkups encouraged  Patient follow-up in 6 months diet exercise discussed

## 2019-07-21 ENCOUNTER — Other Ambulatory Visit: Payer: Self-pay | Admitting: Family Medicine

## 2019-07-22 ENCOUNTER — Telehealth (INDEPENDENT_AMBULATORY_CARE_PROVIDER_SITE_OTHER): Payer: Medicare Other | Admitting: Family Medicine

## 2019-07-22 DIAGNOSIS — J0110 Acute frontal sinusitis, unspecified: Secondary | ICD-10-CM

## 2019-07-22 MED ORDER — AMOXICILLIN 500 MG PO CAPS: 500.0000 mg | ORAL_CAPSULE | Freq: Three times a day (TID) | ORAL | 0 refills | Status: DC

## 2019-07-22 NOTE — Progress Notes (Signed)
Patient ID: Maria [name], female    DOB: [birth year], 64 y.o.   MRN: [id number]   Chief Complaint  Patient presents with  . Sinusitis    Pt has sinus headache, cough, runny nose, and postnasal drainage. Pt states that when she cought she has a sharp pain from temple to temple. started yesterday morning but has become worse today. pt has taken allergy meds and sinus medications but no relief.    Subjective:    HPI   Pt having sinus headache, mild cough, with clear nasal discharge.  Some mild post nasal drip.  Started 1 day ago.  Pain in headache when coughing radiates to both temples.  No fever or sore throat.  No sick contacts or known exposure to covid. Has been taking allergy meds and sudafed otc. Not using nasal sprays, says they make her nauseated. Has had h/o sinus infections in past.  Pt stating usually getting antibiotics to "clear it up."  Virtual Visit via Telephone Note  I connected with Maria [name] on 07/22/19 at  1:40 PM EDT by telephone and verified that I am speaking with the correct person using two identifiers.  Location: Patient: home Provider: office   I discussed the limitations, risks, security and privacy concerns of performing an evaluation and management service by telephone and the availability of in person appointments. I also discussed with the patient that there may be a patient responsible charge related to this service. The patient expressed understanding and agreed to proceed.   Medical History Maria [name] has a past medical history of Allergy, Asthma, Cancer ([place]), Complication of anesthesia, CTS (carpal tunnel syndrome), Diabetes mellitus without complication ([place]), Early cataracts, bilateral, Fracture, GERD (gastroesophageal reflux disease), Glaucoma, Glaucoma, History of kidney stones, Hyperlipidemia, Hypertension, Hypothyroidism, Neuropathy, Vitamin D deficiency, and Wears glasses.   Outpatient Encounter Medications as of 07/22/2019  Medication Sig   . acetaminophen (TYLENOL) 500 MG tablet Take 1,000 mg by mouth every 6 (six) hours as needed for moderate pain.  Marland Kitchen albuterol (PROVENTIL) (2.5 MG/3ML) 0.083% nebulizer solution USE 1 VIAL IN NEBULIZER EVERY 4 HOURS AS NEEDED FOR WHEEZING (Patient taking differently: Take 2.5 mg by nebulization every 4 (four) hours as needed for wheezing or shortness of breath. )  . albuterol (VENTOLIN HFA) 108 (90 Base) MCG/ACT inhaler Inhale 2 puffs into the lungs every 6 (six) hours as needed for wheezi ng orshortness of breath.  . ALPRAZolam (XANAX) 1 MG tablet TAKE 1 TABLET AT BEDTIME AS NEEDED FOR SLEEP  . aspirin (BAYER ASPIRIN) 325 MG tablet Take 1 tablet (325 mg total) by mouth daily.  . Fluticasone-Salmeterol (ADVAIR DISKUS) 500-50 MCG/DOSE AEPB 1 PUFF TWICE DAILY. RINSE MOUTH WITH WATER AFTER EACH USE. (Patient taking differently: Inhale 1 puff into the lungs 2 (two) times a day. )  . glipiZIDE (GLUCOTROL XL) 10 MG 24 hr tablet TAKE 1 TABLET DAILY  . ibuprofen (ADVIL) 200 MG tablet Take 400 mg by mouth every 6 (six) hours as needed for moderate pain.  . Insulin Glargine (LANTUS SOLOSTAR) 100 UNIT/ML Solostar Pen INJECT 42 UNITS SQ EVERY EVENING FOR DIABETES  . ketoconazole (NIZORAL) 2 % cream Apply bid to rash (Patient taking differently: Apply 1 application topically 2 (two) times daily as needed (rash). )  . levothyroxine (SYNTHROID) 75 MCG tablet TAKE 1 TABLET DAILY  . lisinopril (ZESTRIL) 5 MG tablet TAKE (1) TABLET DAILY FOR HIGH BLOOD PRESSURE.  Marland Kitchen LUMIGAN 0.01 % SOLN Place 1 drop into both eyes at bedtime.   Marland Kitchen  metFORMIN (GLUCOPHAGE) 500 MG tablet TAKE 2 TABLETS 2 TIMES A DAY WITH MEALS  . mometasone (ELOCON) 0.1 % cream Apply 1 application topically daily. (Patient taking differently: Apply 1 application topically daily as needed (rash). )  . montelukast (SINGULAIR) 10 MG tablet Take 1 tablet (10 mg total) by mouth daily.  Marland Kitchen omeprazole (PRILOSEC) 40 MG capsule Take 1 capsule (40 mg total) by mouth  daily.  . ondansetron (ZOFRAN ODT) 4 MG disintegrating tablet Take 1 tablet (4 mg total) by mouth every 8 (eight) hours as needed for nausea or vomiting. (Patient taking differently: Take 4 mg by mouth every 6 (six) hours as needed for nausea or vomiting. )  . OVER THE COUNTER MEDICATION Flonase PRN  . phenylephrine (SUDAFED PE) 10 MG TABS tablet Take 10 mg by mouth every 4 (four) hours as needed (sinus congestion).  . rosuvastatin (CRESTOR) 20 MG tablet Take 1 tablet (20 mg total) by mouth daily.  Marland Kitchen triamcinolone cream (KENALOG) 0.1 % APPLY TO AFFECTED AREAS 2 TIMES A DAY AS NEEDED (Patient taking differently: Apply 1 application topically 2 (two) times daily as needed (dry skin). )  . ULTICARE SHORT PEN NEEDLES 31G X 8 MM MISC USE AS DIRECTED  . amoxicillin (AMOXIL) 500 MG capsule Take 1 capsule (500 mg total) by mouth 3 (three) times daily.  . [DISCONTINUED] cefPROZIL (CEFZIL) 500 MG tablet Take one tablet po BID for 10 days  . [DISCONTINUED] predniSONE (DELTASONE) 10 MG tablet Take 4 tablet po for 3 days, then 3 tablets po for 3 days, the 2 tablets po for 2 days   No facility-administered encounter medications on file as of 07/22/2019.     Review of Systems  Constitutional: Negative for chills and fever.  HENT: Positive for congestion, postnasal drip, rhinorrhea, sinus pressure and sinus pain. Negative for ear discharge, ear pain, sneezing and sore throat.   Eyes: Negative for pain, discharge and itching.  Respiratory: Negative for cough, shortness of breath and wheezing.   Gastrointestinal: Negative for constipation, diarrhea, nausea and vomiting.     Vitals There were no vitals taken for this visit.  Objective:   Physical Exam  No PE,due to phone visit.  Assessment and Plan   1. Acute non-recurrent frontal sinusitis - amoxicillin (AMOXIL) 500 MG capsule; Take 1 capsule (500 mg total) by mouth 3 (three) times daily.  Dispense: 30 capsule; Refill: 0    Likely viral URI.  Advising symptomatic tx for viral illness. Reviewed usual course of viral illness vs. Sinusitis and antibiotic use.  Gave watch and wait script for amoxicillin if not improving and pain, fever starting, and pressure persisting. Pt in agreement.  Take Tylenol and ibuprofen prn for pain/headache.  Saline nasal spray or sinus rinses 2x per day to help with draining sinuses. Caution with using decongestants like sudafed if having h/o htn.  Could cause pressures to go up.   increase fluids and call if not improving in next 3-5 days.      Pt in agreement.   F/u prn.    Follow Up Instructions:    I discussed the assessment and treatment plan with the patient. The patient was provided an opportunity to ask questions and all were answered. The patient agreed with the plan and demonstrated an understanding of the instructions.   The patient was advised to call back or seek an in-person evaluation if the symptoms worsen or if the condition fails to improve as anticipated.  I provided 15 minutes of non-face-to-face  time during this encounter.    [place], DO 07/22/2019

## 2019-08-05 ENCOUNTER — Ambulatory Visit: Payer: Medicare Other | Admitting: Family Medicine

## 2019-08-05 DIAGNOSIS — I10 Essential (primary) hypertension: Secondary | ICD-10-CM | POA: Diagnosis not present

## 2019-08-05 DIAGNOSIS — E785 Hyperlipidemia, unspecified: Secondary | ICD-10-CM | POA: Diagnosis not present

## 2019-08-05 DIAGNOSIS — Z79899 Other long term (current) drug therapy: Secondary | ICD-10-CM | POA: Diagnosis not present

## 2019-08-05 DIAGNOSIS — Z794 Long term (current) use of insulin: Secondary | ICD-10-CM | POA: Diagnosis not present

## 2019-08-05 DIAGNOSIS — E1140 Type 2 diabetes mellitus with diabetic neuropathy, unspecified: Secondary | ICD-10-CM | POA: Diagnosis not present

## 2019-08-06 LAB — HEPATIC FUNCTION PANEL
ALT: 21 IU/L (ref 0–32)
AST: 19 IU/L (ref 0–40)
Albumin: 4.1 g/dL (ref 3.8–4.8)
Alkaline Phosphatase: 153 IU/L — ABNORMAL HIGH (ref 48–121)
Bilirubin Total: 0.7 mg/dL (ref 0.0–1.2)
Bilirubin, Direct: 0.22 mg/dL (ref 0.00–0.40)
Total Protein: 6.5 g/dL (ref 6.0–8.5)

## 2019-08-06 LAB — HEMOGLOBIN A1C
Est. average glucose Bld gHb Est-mCnc: 169 mg/dL
Hgb A1c MFr Bld: 7.5 % — ABNORMAL HIGH (ref 4.8–5.6)

## 2019-08-06 LAB — LIPID PANEL
Chol/HDL Ratio: 3.2 ratio (ref 0.0–4.4)
Cholesterol, Total: 134 mg/dL (ref 100–199)
HDL: 42 mg/dL (ref >39)
LDL Chol Calc (NIH): 70 mg/dL (ref 0–99)
Triglycerides: 120 mg/dL (ref 0–149)
VLDL Cholesterol Cal: 22 mg/dL (ref 5–40)

## 2019-08-08 ENCOUNTER — Telehealth (INDEPENDENT_AMBULATORY_CARE_PROVIDER_SITE_OTHER): Payer: Medicare Other | Admitting: Family Medicine

## 2019-08-08 ENCOUNTER — Other Ambulatory Visit: Payer: Self-pay

## 2019-08-08 DIAGNOSIS — E785 Hyperlipidemia, unspecified: Secondary | ICD-10-CM | POA: Diagnosis not present

## 2019-08-08 DIAGNOSIS — Z794 Long term (current) use of insulin: Secondary | ICD-10-CM | POA: Diagnosis not present

## 2019-08-08 DIAGNOSIS — G4700 Insomnia, unspecified: Secondary | ICD-10-CM

## 2019-08-08 DIAGNOSIS — E1140 Type 2 diabetes mellitus with diabetic neuropathy, unspecified: Secondary | ICD-10-CM | POA: Diagnosis not present

## 2019-08-08 MED ORDER — METFORMIN HCL 500 MG PO TABS: ORAL_TABLET | ORAL | 5 refills | Status: DC

## 2019-08-08 MED ORDER — GLIPIZIDE ER 10 MG PO TB24: 10.0000 mg | ORAL_TABLET | Freq: Every day | ORAL | 1 refills | Status: DC

## 2019-08-08 MED ORDER — LANTUS SOLOSTAR 100 UNIT/ML ~~LOC~~ SOPN: PEN_INJECTOR | SUBCUTANEOUS | 5 refills | Status: DC

## 2019-08-08 NOTE — Progress Notes (Signed)
Patient ID: Maria [name], female    DOB: 01/21/1956, 64 y.o.   MRN: [id number]  Virtual Visit via Video Note  I connected with Maria [name] on 08/08/19 at  9:00 AM EDT by a video enabled telemedicine application and verified that I am speaking with the correct person using two identifiers.  Location: Patient: Home Provider: Office    I discussed the limitations of evaluation and management by telemedicine and the availability of in person appointments. The patient expressed understanding and agreed to proceed.  Chief Complaint  Patient presents with  . Diabetes   Subjective:    Diabetes She presents for her follow-up diabetic visit. She has type 2 diabetes mellitus. Pertinent negatives for hypoglycemia include no dizziness, headaches or nervousness/anxiousness. Pertinent negatives for diabetes include no chest pain, no polydipsia, no polyphagia, no polyuria and no weakness. There are no diabetic complications. She is compliant with treatment all of the time. Home blood sugar record trend: Has been good in the mornings and typically higher during the day. She does not see a podiatrist.Eye exam is current.   DM2- doing well with medications and compliant with meds.  No SEs.    Lab Results  Component Value Date   HGBA1C 7.5 (H) 08/05/2019   Has been on a lot of medications for allergies. Had to take prednisone recently in the past month. On lantus at night- 38 units.  But on script stating 42 units. Taking glipizide and metformin. No hypolgycemic episodes.  No sores, ulcers, or rashes on body. No inc in thirst or urination.   Anxiety/insomnia- Pt taking xanax 1mg  tablets qhs for insomnia. -has been taking this medication since 2014.  -has not tried other medications for sleep/anxiety or depression. -doesn't want to take ambien for sleep.  Medical History Lachelle has a past medical history of Allergy, Asthma, Cancer ([place]), Complication of anesthesia, CTS (carpal tunnel  syndrome), Diabetes mellitus without complication ([place]), Early cataracts, bilateral, Fracture, GERD (gastroesophageal reflux disease), Glaucoma, Glaucoma, History of kidney stones, Hyperlipidemia, Hypertension, Hypothyroidism, Neuropathy, Vitamin D deficiency, and Wears glasses.   Outpatient Encounter Medications as of 08/08/2019  Medication Sig  . acetaminophen (TYLENOL) 500 MG tablet Take 1,000 mg by mouth every 6 (six) hours as needed for moderate pain.  Marland Kitchen albuterol (PROVENTIL) (2.5 MG/3ML) 0.083% nebulizer solution USE 1 VIAL IN NEBULIZER EVERY 4 HOURS AS NEEDED FOR WHEEZING (Patient taking differently: Take 2.5 mg by nebulization every 4 (four) hours as needed for wheezing or shortness of breath. )  . albuterol (VENTOLIN HFA) 108 (90 Base) MCG/ACT inhaler Inhale 2 puffs into the lungs every 6 (six) hours as needed for wheezi ng orshortness of breath.  . ALPRAZolam (XANAX) 1 MG tablet TAKE 1 TABLET AT BEDTIME AS NEEDED FOR SLEEP  . aspirin (BAYER ASPIRIN) 325 MG tablet Take 1 tablet (325 mg total) by mouth daily.  . Fluticasone-Salmeterol (ADVAIR DISKUS) 500-50 MCG/DOSE AEPB 1 PUFF TWICE DAILY. RINSE MOUTH WITH WATER AFTER EACH USE. (Patient taking differently: Inhale 1 puff into the lungs 2 (two) times a day. )  . glipiZIDE (GLUCOTROL XL) 10 MG 24 hr tablet Take 1 tablet (10 mg total) by mouth daily.  Marland Kitchen ibuprofen (ADVIL) 200 MG tablet Take 400 mg by mouth every 6 (six) hours as needed for moderate pain.  Marland Kitchen insulin glargine (LANTUS SOLOSTAR) 100 UNIT/ML Solostar Pen INJECT 42 UNITS SQ EVERY EVENING FOR DIABETES  . ketoconazole (NIZORAL) 2 % cream Apply bid to rash (Patient taking differently: Apply 1  application topically 2 (two) times daily as needed (rash). )  . levothyroxine (SYNTHROID) 75 MCG tablet TAKE 1 TABLET DAILY  . lisinopril (ZESTRIL) 5 MG tablet TAKE (1) TABLET DAILY FOR HIGH BLOOD PRESSURE.  Marland Kitchen LUMIGAN 0.01 % SOLN Place 1 drop into both eyes at bedtime.   . metFORMIN (GLUCOPHAGE) 500  MG tablet TAKE 2 TABLETS 2 TIMES A DAY WITH MEALS  . mometasone (ELOCON) 0.1 % cream Apply 1 application topically daily. (Patient taking differently: Apply 1 application topically daily as needed (rash). )  . montelukast (SINGULAIR) 10 MG tablet Take 1 tablet (10 mg total) by mouth daily.  Marland Kitchen omeprazole (PRILOSEC) 40 MG capsule Take 1 capsule (40 mg total) by mouth daily.  . ondansetron (ZOFRAN ODT) 4 MG disintegrating tablet Take 1 tablet (4 mg total) by mouth every 8 (eight) hours as needed for nausea or vomiting. (Patient taking differently: Take 4 mg by mouth every 6 (six) hours as needed for nausea or vomiting. )  . OVER THE COUNTER MEDICATION Flonase PRN  . phenylephrine (SUDAFED PE) 10 MG TABS tablet Take 10 mg by mouth every 4 (four) hours as needed (sinus congestion).  . rosuvastatin (CRESTOR) 20 MG tablet Take 1 tablet (20 mg total) by mouth daily.  Marland Kitchen triamcinolone cream (KENALOG) 0.1 % APPLY TO AFFECTED AREAS 2 TIMES A DAY AS NEEDED (Patient taking differently: Apply 1 application topically 2 (two) times daily as needed (dry skin). )  . ULTICARE SHORT PEN NEEDLES 31G X 8 MM MISC USE AS DIRECTED  . [DISCONTINUED] amoxicillin (AMOXIL) 500 MG capsule Take 1 capsule (500 mg total) by mouth 3 (three) times daily.  . [DISCONTINUED] glipiZIDE (GLUCOTROL XL) 10 MG 24 hr tablet TAKE 1 TABLET DAILY  . [DISCONTINUED] Insulin Glargine (LANTUS SOLOSTAR) 100 UNIT/ML Solostar Pen INJECT 42 UNITS SQ EVERY EVENING FOR DIABETES  . [DISCONTINUED] metFORMIN (GLUCOPHAGE) 500 MG tablet TAKE 2 TABLETS 2 TIMES A DAY WITH MEALS   No facility-administered encounter medications on file as of 08/08/2019.     Review of Systems  Constitutional: Negative for chills and fever.  HENT: Negative for congestion, rhinorrhea and sore throat.   Respiratory: Negative for cough, shortness of breath and wheezing.   Cardiovascular: Negative for chest pain and leg swelling.  Gastrointestinal: Negative for abdominal pain,  diarrhea, nausea and vomiting.  Endocrine: Negative for polydipsia, polyphagia and polyuria.  Genitourinary: Negative for dysuria and frequency.  Musculoskeletal: Negative for arthralgias and back pain.  Skin: Negative for rash.  Neurological: Negative for dizziness, weakness and headaches.  Psychiatric/Behavioral: Positive for sleep disturbance. Negative for dysphoric mood. The patient is not nervous/anxious.      Vitals There were no vitals taken for this visit.  Objective:   Physical Exam  No PE due to phone visit.  Assessment and Plan   1. Type 2 diabetes mellitus with diabetic neuropathy, with long-term current use of insulin ([facility]) - insulin glargine (LANTUS SOLOSTAR) 100 UNIT/ML Solostar Pen; INJECT 42 UNITS SQ EVERY EVENING FOR DIABETES  Dispense: 15 mL; Refill: 5 - metFORMIN (GLUCOPHAGE) 500 MG tablet; TAKE 2 TABLETS 2 TIMES A DAY WITH MEALS  Dispense: 360 tablet; Refill: 5 - glipiZIDE (GLUCOTROL XL) 10 MG 24 hr tablet; Take 1 tablet (10 mg total) by mouth daily.  Dispense: 90 tablet; Refill: 1  2. Insomnia, unspecified type  3. Hyperlipidemia with target LDL less than 100    Dm2- pt was supposed to increase lantus from 38 units to 42 units at night in  11/20.  She will start that now, since inc in A1c from 5.4 to 7.5.  Will cont all other medications for DM2.   HLD- stable, improved. Reviewed lipid panel and LFTs- stable.  Insomnia- Pt taking 1mg  xanax qhs. Discussed tapering down or switching to trazodone or SSRI to address underlying anxiety/depression and insomnia.  Discussed tapering down from xanax 1mg  to 0.5 and eventually tapering off this medication to hydroxyzine, buspar, or trazodone for insomnia.   Discussed the risk vs. Benefits of being on benzodiazepines for long term and memory/dementia concerns over time. Pt voiced understanding and declined psychiatry referral for insomnia/anxiety/depression.    Follow Up Instructions:    I discussed the  assessment and treatment plan with the patient. The patient was provided an opportunity to ask questions and all were answered. The patient agreed with the plan and demonstrated an understanding of the instructions.   The patient was advised to call back or seek an in-person evaluation if the symptoms worsen or if the condition fails to improve as anticipated.  I provided 15 minutes of non-face-to-face time during this encounter.

## 2019-08-19 ENCOUNTER — Other Ambulatory Visit: Payer: Self-pay | Admitting: Family Medicine

## 2019-08-20 NOTE — Telephone Encounter (Signed)
What is cream for? Hasn't had prescribed in 3 yrs. Thx.   Dr. Lovena Le

## 2019-08-20 NOTE — Telephone Encounter (Signed)
Contacted patient. Pt states that hands would get bumps on them and begin to itch. Per patient "Kind of  like watery bumps" on both hands. Flares up every so often. When pt keeps hands in water and hands get dry is when she has the flare up. Please advise. Thank you

## 2019-08-21 ENCOUNTER — Telehealth: Payer: Self-pay | Admitting: Family Medicine

## 2019-08-21 MED ORDER — TRIAMCINOLONE ACETONIDE 0.1 % EX CREA: 1.0000 "application " | TOPICAL_CREAM | Freq: Two times a day (BID) | CUTANEOUS | 0 refills | Status: AC | PRN

## 2019-08-21 NOTE — Telephone Encounter (Signed)
I apologize if you denied this previously but I am unable to see who denied it and patient is calling back. This was her response from the other day for why she is using it.    Vicente Males, LPN to Elvia Collum M, DO      9:08 AM Note   Contacted patient. Pt states that hands would get bumps on them and begin to itch. Per patient "Kind of  like watery bumps" on both hands. Flares up every so often. When pt keeps hands in water and hands get dry is when she has the flare up. Please advise. Thank you

## 2019-08-21 NOTE — Telephone Encounter (Signed)
Pt is checking status of triamcinolone cream (KENALOG) 0.1 %  being sent to pharmacy. She wants to know why it was denied.   Refill request from 6/1 tanya talked to pt.

## 2019-08-22 NOTE — Telephone Encounter (Signed)
Patient notified

## 2019-10-06 LAB — HM DIABETES EYE EXAM

## 2019-10-20 ENCOUNTER — Other Ambulatory Visit: Payer: Self-pay | Admitting: *Deleted

## 2019-10-20 MED ORDER — LISINOPRIL 5 MG PO TABS: ORAL_TABLET | ORAL | 0 refills | Status: DC

## 2020-01-15 ENCOUNTER — Other Ambulatory Visit: Payer: Self-pay | Admitting: Family Medicine

## 2020-01-28 LAB — HM DIABETES EYE EXAM

## 2020-02-14 ENCOUNTER — Other Ambulatory Visit: Payer: Self-pay | Admitting: Family Medicine

## 2020-02-14 DIAGNOSIS — Z794 Long term (current) use of insulin: Secondary | ICD-10-CM

## 2020-02-16 ENCOUNTER — Other Ambulatory Visit: Payer: Self-pay | Admitting: Family Medicine

## 2020-02-16 DIAGNOSIS — Z794 Long term (current) use of insulin: Secondary | ICD-10-CM

## 2020-02-16 NOTE — Telephone Encounter (Signed)
Left message

## 2020-02-20 NOTE — Progress Notes (Signed)
[facility] Note  02/25/2020     CHIEF COMPLAINT Patient presents for Retina Evaluation   HISTORY OF PRESENT ILLNESS: Maria [name] is a 64 y.o. female who presents to the clinic today for:   HPI    Retina Evaluation    In both eyes.  This started weeks ago.  Duration of weeks.  Context:  distance vision and near vision.  I, the attending physician,  performed the HPI with the patient and updated documentation appropriately.          Comments    BS: 132 this AM A1c: 7.0 Pt states vision is much better, post CEIOL OU.  Pt denies eye pain or discomfort and denies any new or worsening floaters or fol OU.  Pt currently on: Lumigan QHS OU Ketorolac QID OD       Last edited by Bernarda Caffey, MD on 02/25/2020  4:36 PM. (History)    pt is here on the referral of Dr. Herbert Deaner for baseline retinal eval, pt had cataract sx OU with her, OS in August and OD in October, pt is still on Ketorolac QID OD, she also takes Lumigan which she has taken for "years", pt states Dr. Herbert Deaner found a "cyst" in her right eye and wanted it looked at, pt endorses being diabetic and states her sugar is well controlled   Referring physician: Monna Fam, MD [place], [place] [id number]  HISTORICAL INFORMATION:   Selected notes from the MEDICAL RECORD NUMBER Referred by Dr. Herbert Deaner LEE: 01/28/2020 BCVA: 20/20 OU NPDR OU, PCIOL OU, possible CME OS, POAG OU    CURRENT MEDICATIONS: Current Outpatient Medications (Ophthalmic Drugs)  Medication Sig  . LUMIGAN 0.01 % SOLN Place 1 drop into both eyes at bedtime.    No current facility-administered medications for this visit. (Ophthalmic Drugs)   Current Outpatient Medications (Other)  Medication Sig  . acetaminophen (TYLENOL) 500 MG tablet Take 1,000 mg by mouth every 6 (six) hours as needed for moderate pain.  Marland Kitchen albuterol (PROVENTIL) (2.5 MG/3ML) 0.083% nebulizer solution USE 1 VIAL IN NEBULIZER EVERY 4  HOURS AS NEEDED FOR WHEEZING (Patient taking differently: Take 2.5 mg by nebulization every 4 (four) hours as needed for wheezing or shortness of breath. )  . albuterol (VENTOLIN HFA) 108 (90 Base) MCG/ACT inhaler Inhale 2 puffs into the lungs every 6 (six) hours as needed for wheezi ng orshortness of breath.  . ALPRAZolam (XANAX) 1 MG tablet TAKE 1 TABLET AT BEDTIME AS NEEDED FOR SLEEP  . Fluticasone-Salmeterol (ADVAIR DISKUS) 500-50 MCG/DOSE AEPB 1 PUFF TWICE DAILY. RINSE MOUTH WITH WATER AFTER EACH USE. (Patient taking differently: Inhale 1 puff into the lungs 2 (two) times a day. )  . glipiZIDE (GLUCOTROL XL) 10 MG 24 hr tablet Take 1 tablet (10 mg total) by mouth daily.  Marland Kitchen ibuprofen (ADVIL) 200 MG tablet Take 400 mg by mouth every 6 (six) hours as needed for moderate pain.  Marland Kitchen insulin glargine (LANTUS SOLOSTAR) 100 UNIT/ML Solostar Pen INJECT 42 UNITS SQ EVERY EVENING FOR DIABETES  . ketoconazole (NIZORAL) 2 % cream Apply bid to rash (Patient taking differently: Apply 1 application topically 2 (two) times daily as needed (rash). )  . levothyroxine (SYNTHROID) 75 MCG tablet TAKE 1 TABLET DAILY  . lisinopril (ZESTRIL) 5 MG tablet TAKE (1) TABLET DAILY FOR HIGH BLOOD PRESSURE.  . metFORMIN (GLUCOPHAGE) 500 MG tablet TAKE 2 TABLETS 2 TIMES A DAY WITH MEALS  . mometasone (  ELOCON) 0.1 % cream Apply 1 application topically daily. (Patient taking differently: Apply 1 application topically daily as needed (rash). )  . montelukast (SINGULAIR) 10 MG tablet Take 1 tablet (10 mg total) by mouth daily.  Marland Kitchen omeprazole (PRILOSEC) 40 MG capsule Take 1 capsule (40 mg total) by mouth daily.  . ondansetron (ZOFRAN ODT) 4 MG disintegrating tablet Take 1 tablet (4 mg total) by mouth every 8 (eight) hours as needed for nausea or vomiting. (Patient taking differently: Take 4 mg by mouth every 6 (six) hours as needed for nausea or vomiting. )  . OVER THE COUNTER MEDICATION Flonase PRN  . phenylephrine (SUDAFED PE) 10 MG  TABS tablet Take 10 mg by mouth every 4 (four) hours as needed (sinus congestion).  . rosuvastatin (CRESTOR) 20 MG tablet TAKE 1 TABLET DAILY  . triamcinolone cream (KENALOG) 0.1 % Apply 1 application topically 2 (two) times daily as needed (dry skin).  Marland Kitchen ULTICARE SHORT PEN NEEDLES 31G X 8 MM MISC USE AS DIRECTED   No current facility-administered medications for this visit. (Other)      REVIEW OF SYSTEMS: ROS    Positive for: Endocrine, Eyes, Psychiatric   Negative for: Constitutional, Gastrointestinal, Neurological, Skin, Genitourinary, Musculoskeletal, HENT, Cardiovascular, Respiratory, Allergic/Imm, Heme/Lymph   Last edited by Doneen Poisson on 02/25/2020  1:39 PM. (History)       ALLERGIES No Known Allergies  PAST MEDICAL HISTORY Past Medical History:  Diagnosis Date  . Allergy   . Asthma   . Cancer ([place])    cervical  . Complication of anesthesia    " I woke and was short of breath."  . CTS (carpal tunnel syndrome)   . Diabetes mellitus without complication ([place])   . Early cataracts, bilateral   . Fracture    trimalleolar ankle left  . GERD (gastroesophageal reflux disease)   . Glaucoma   . Glaucoma   . History of kidney stones    " I passed it"  . Hyperlipidemia   . Hypertension   . Hypothyroidism   . Neuropathy   . Vitamin D deficiency   . Wears glasses    Past Surgical History:  Procedure Laterality Date  . ABDOMINAL HYSTERECTOMY    . APPENDECTOMY    . CARDIAC CATHETERIZATION  2006   negative  . COLONOSCOPY  2009   negative  . INNER EAR SURGERY Right 1981  . left shoulder surgery Left 10/2009  . ORIF ANKLE FRACTURE Left 09/05/2018   Procedure: OPEN REDUCTION INTERNAL FIXATION TRIMALLEOLAR FRACTURE POSSIBLE SYNDEMOSIS;  Surgeon: Erle Crocker, MD;  Location: [facility];  Service: Orthopedics;  Laterality: Left;  . TONSILLECTOMY    . TUBAL LIGATION      FAMILY HISTORY Family History  Problem Relation Age of Onset  . Diabetes Mother   .  Prostate cancer Father   . Heart disease Father   . Heart attack Other     SOCIAL HISTORY Social History   Tobacco Use  . Smoking status: Former Smoker    Packs/day: 1.00    Years: 15.00    Pack years: 15.00    Types: Cigarettes    Quit date: 06/05/2001    Years since quitting: 18.7  . Smokeless tobacco: Never Used  Vaping Use  . Vaping Use: Never used  Substance Use Topics  . Alcohol use: No  . Drug use: No         OPHTHALMIC EXAM:  Base Eye Exam    Visual Acuity (Snellen -  Linear)      Right Left   Dist cc 20/20 -1 20/20 -1   Correction: Glasses       Tonometry (Tonopen, 1:34 PM)      Right Left   Pressure 15 14       Pupils      Dark Light Shape React APD   Right 3 2 Round Brisk 0   Left 3 2 Round Brisk 0       Visual Fields      Left Right    Full Full       Extraocular Movement      Right Left    Full Full       Neuro/Psych    Oriented x3: Yes   Mood/Affect: Normal       Dilation    Both eyes: 1.0% Mydriacyl, 2.5% Phenylephrine @ 1:34 PM        Slit Lamp and Fundus Exam    Slit Lamp Exam      Right Left   Lids/Lashes Dermatochalasis - upper lid, Meibomian gland dysfunction, Telangiectasia Dermatochalasis - upper lid, Meibomian gland dysfunction, Telangiectasia   Conjunctiva/Sclera White and quiet White and quiet   Cornea 1-2+ Punctate epithelial erosions, arcus, well healed temporal cataract wounds 2+ Punctate epithelial erosions, arcus, well healed temporal cataract wounds   Anterior Chamber Deep, 0.5+pigment Deep, 0.5+pigment   Iris Round and dilated, No NVI Round and dilated, No NVI   Lens PC IOL in good position PC IOL in good position   Vitreous Vitreous syneresis Vitreous syneresis       Fundus Exam      Right Left   Disc Mild Pallor, Sharp rim Mild Pallor, Sharp rim   C/D Ratio 0.6 0.5   Macula Flat, Blunted foveal reflex, mild RPE mottling and clumping, rare Microaneurysms greatest temporal macula Flat, Blunted foveal  reflex, mild RPE mottling and clumping, scattered MA/DBH   Vessels attenuated, Tortuous attenuated, mild tortuousity   Periphery Attached, scattered MA/DBH   Attached, scattered MA/DBH          Refraction    Wearing Rx      Sphere Cylinder Axis Add   Right -0.25 +1.00 016 +2.75   Left -1.00 Sphere  +2.75       Manifest Refraction      Sphere Cylinder Axis Dist VA   Right -0.25 +1.00 016 20/20-1   Left -1.00 Sphere  20/20-1          IMAGING AND PROCEDURES  Imaging and Procedures for 02/25/2020  OCT, Retina - OU - Both Eyes       Right Eye Quality was good. Central Foveal Thickness: 316. Progression has no prior data. Findings include abnormal foveal contour, no IRF, no SRF, vitreomacular adhesion  (Blunted foveal depression).   Left Eye Quality was good. Central Foveal Thickness: 317. Progression has no prior data. Findings include no SRF, no IRF, normal foveal contour (Blunted foveal depression, trace cystic changes centrally and temporal mac).   Notes *Images captured and stored on drive  Diagnosis / Impression:  no IRF/SRF OU OS: Blunted foveal depression, trace cystic changes centrally and temporal mac.  Clinical management:  See below  Abbreviations: NFP - Normal foveal profile. CME - cystoid macular edema. PED - pigment epithelial detachment. IRF - intraretinal fluid. SRF - subretinal fluid. EZ - ellipsoid zone. ERM - epiretinal membrane. ORA - outer retinal atrophy. ORT - outer retinal tubulation. SRHM - subretinal hyper-reflective material. IRHM - intraretinal hyper-reflective  material        Fluorescein Angiography Optos (Transit OS)       Right Eye   Progression has no prior data. Early phase findings include microaneurysm. Mid/Late phase findings include leakage, microaneurysm (No NV).   Left Eye   Progression has no prior data. Early phase findings include microaneurysm. Mid/Late phase findings include microaneurysm, leakage (No NV).    Notes **Images stored on drive**  Impression: Moderate NPDR OU - leaking MA OU - no NV OU                  ASSESSMENT/PLAN:    ICD-10-CM   1. Moderate nonproliferative diabetic retinopathy of both eyes without macular edema associated with type 2 diabetes mellitus ([place])  P23.3007   2. Retinal edema  H35.81 OCT, Retina - OU - Both Eyes  3. Essential hypertension  I10   4. Hypertensive retinopathy of both eyes  H35.033 Fluorescein Angiography Optos (Transit OS)  5. Pseudophakia, both eyes  Z96.1     1,2.Moderate nonproliferative diabetic retinopathy w/o DME, OU - The incidence, risk factors for progression, natural history and treatment options for diabetic retinopathy were discussed with patient.   - The need for close monitoring of blood glucose, blood pressure, and serum lipids, avoiding cigarette or any type of tobacco, and the need for long term follow up was also discussed with patient. - exam w/ scattered MA/DBH OU - OCT without diabetic macular edema, OU -- +tr cystic changes OS - FA 12.8.21 (prior to check out) shows leaking MA, no NV OU - f/u in 3 mos -- DFE/OCT, review FA  3,4. Hypertensive retinopathy OU - discussed importance of tight BP control - monitor  5. Pseudophakia OU  - s/p CE/IOL (Dr. Herbert Deaner; OS: 08.21, OD: 10.21  - IOL in good position, doing well  - monitor     Ophthalmic Meds Ordered this visit:  No orders of the defined types were placed in this encounter.      Return in about 3 months (around 05/25/2020) for f/u NPDR OU, DFE, OCT.  There are no Patient Instructions on file for this visit.   Explained the diagnoses, plan, and follow up with the patient and they expressed understanding.  Patient expressed understanding of the importance of proper follow up care.   This document serves as a record of services personally performed by Gardiner Sleeper, MD, PhD. It was created on their behalf by San Jetty. Owens Shark, OA an ophthalmic  technician. The creation of this record is the provider's dictation and/or activities during the visit.    Electronically signed by: San Jetty. Marguerita Merles 12.08.2021 4:57 PM   Gardiner Sleeper, M.D., Ph.D. Diseases & Surgery of the Retina and Vitreous [facility]  I have reviewed the above documentation for accuracy and completeness, and I agree with the above. Gardiner Sleeper, M.D., Ph.D. 02/25/20 4:57 PM  Abbreviations: M myopia (nearsighted); A astigmatism; H hyperopia (farsighted); P presbyopia; Mrx spectacle prescription;  CTL contact lenses; OD right eye; OS left eye; OU both eyes  XT exotropia; ET esotropia; PEK punctate epithelial keratitis; PEE punctate epithelial erosions; DES dry eye syndrome; MGD meibomian gland dysfunction; ATs artificial tears; PFAT's preservative free artificial tears; [place] nuclear sclerotic cataract; PSC posterior subcapsular cataract; ERM epi-retinal membrane; PVD posterior vitreous detachment; RD retinal detachment; DM diabetes mellitus; DR diabetic retinopathy; NPDR non-proliferative diabetic retinopathy; PDR proliferative diabetic retinopathy; CSME clinically significant macular edema; DME diabetic macular edema; dbh  dot blot hemorrhages; CWS cotton wool spot; POAG primary open angle glaucoma; C/D cup-to-disc ratio; HVF humphrey visual field; GVF goldmann visual field; OCT optical coherence tomography; IOP intraocular pressure; BRVO Branch retinal vein occlusion; CRVO central retinal vein occlusion; CRAO central retinal artery occlusion; BRAO branch retinal artery occlusion; RT retinal tear; SB scleral buckle; PPV pars plana vitrectomy; VH Vitreous hemorrhage; PRP panretinal laser photocoagulation; IVK intravitreal kenalog; VMT vitreomacular traction; MH Macular hole;  NVD neovascularization of the disc; NVE neovascularization elsewhere; AREDS age related eye disease study; ARMD age related macular degeneration; POAG primary open angle glaucoma;  EBMD epithelial/anterior basement membrane dystrophy; ACIOL anterior chamber intraocular lens; IOL intraocular lens; PCIOL posterior chamber intraocular lens; Phaco/IOL phacoemulsification with intraocular lens placement; [place] photorefractive keratectomy; LASIK laser assisted in situ keratomileusis; HTN hypertension; DM diabetes mellitus; COPD chronic obstructive pulmonary disease

## 2020-02-25 ENCOUNTER — Other Ambulatory Visit: Payer: Self-pay

## 2020-02-25 ENCOUNTER — Encounter (INDEPENDENT_AMBULATORY_CARE_PROVIDER_SITE_OTHER): Payer: Self-pay | Admitting: Ophthalmology

## 2020-02-25 ENCOUNTER — Ambulatory Visit (INDEPENDENT_AMBULATORY_CARE_PROVIDER_SITE_OTHER): Payer: Medicare Other | Admitting: Ophthalmology

## 2020-02-25 DIAGNOSIS — H3581 Retinal edema: Secondary | ICD-10-CM

## 2020-02-25 DIAGNOSIS — E113393 Type 2 diabetes mellitus with moderate nonproliferative diabetic retinopathy without macular edema, bilateral: Secondary | ICD-10-CM | POA: Diagnosis not present

## 2020-02-25 DIAGNOSIS — Z961 Presence of intraocular lens: Secondary | ICD-10-CM

## 2020-02-25 DIAGNOSIS — H35033 Hypertensive retinopathy, bilateral: Secondary | ICD-10-CM

## 2020-02-25 DIAGNOSIS — I10 Essential (primary) hypertension: Secondary | ICD-10-CM

## 2020-03-17 ENCOUNTER — Telehealth: Payer: Self-pay | Admitting: *Deleted

## 2020-03-17 NOTE — Telephone Encounter (Signed)
Fax from [facility]. Pt states she is now taking metformin 2 tab every morning and no pm dose. Pharm wants an updated script with new directions for insurance/adherence purposes. Directions in chart states 2 bid. Pt last seen 07/30/19 for diabetes and note on visit states 2 bid. Called and left message to return call with pt to clarify.

## 2020-03-18 NOTE — Telephone Encounter (Signed)
When I asked her about clarification, that is when she stated she had switched doctors. I asked was he her new PCP and stated yes. She gave no information on clarification.

## 2020-03-18 NOTE — Telephone Encounter (Signed)
Ok, did she get her new doctor the clarify the prescription?  Thx. Dr. Ladona Ridgel

## 2020-03-18 NOTE — Telephone Encounter (Signed)
Ok thanks,  just have the front change the pcp then.  Thx.   Dr. Ladona Ridgel

## 2020-03-18 NOTE — Telephone Encounter (Signed)
Contacted patient. Pt states that she has changed PCP. Pt is now seeing Dr.Sterling at [facility] in [facility].

## 2020-03-18 NOTE — Telephone Encounter (Signed)
Pt no longer ours; please change PCP. Thank you

## 2020-03-23 ENCOUNTER — Other Ambulatory Visit: Payer: Self-pay | Admitting: *Deleted

## 2020-03-23 ENCOUNTER — Telehealth: Payer: Self-pay | Admitting: *Deleted

## 2020-03-23 DIAGNOSIS — Z794 Long term (current) use of insulin: Secondary | ICD-10-CM

## 2020-03-23 DIAGNOSIS — E1140 Type 2 diabetes mellitus with diabetic neuropathy, unspecified: Secondary | ICD-10-CM

## 2020-03-23 MED ORDER — METFORMIN HCL 500 MG PO TABS: ORAL_TABLET | ORAL | 0 refills | Status: DC

## 2020-03-23 NOTE — Telephone Encounter (Signed)
Ok pls give the new script with metformin 500mg  take 2 tab daily with meal.   Thx.   Dr. 

## 2020-03-23 NOTE — Telephone Encounter (Signed)
Rx sent in with new directions

## 2020-03-23 NOTE — Telephone Encounter (Signed)
[facility] called and states pt told him that she was taking metformin 500mg  2qd instead of directions they had at pharm of 2 bid. Pt last seen 08/08/19 for diabetes. Pharm would like a new script with directions of 2qd.

## 2020-04-12 ENCOUNTER — Other Ambulatory Visit: Payer: Self-pay | Admitting: Family Medicine

## 2020-05-18 NOTE — Progress Notes (Shared)
[facility] Note  05/25/2020     CHIEF COMPLAINT Patient presents for No chief complaint on file.   HISTORY OF PRESENT ILLNESS: Maria [name] is a 65 y.o. female who presents to the clinic today for:    Referring physician: Monna Fam, MD [place], [place] [address]  HISTORICAL INFORMATION:   Selected notes from the MEDICAL RECORD NUMBER Referred by Dr. Herbert Deaner LEE: 01/28/2020 BCVA: 20/20 OU NPDR OU, PCIOL OU, possible CME OS, POAG OU   CURRENT MEDICATIONS: Current Outpatient Medications (Ophthalmic Drugs)  Medication Sig  . LUMIGAN 0.01 % SOLN Place 1 drop into both eyes at bedtime.    No current facility-administered medications for this visit. (Ophthalmic Drugs)   Current Outpatient Medications (Other)  Medication Sig  . acetaminophen (TYLENOL) 500 MG tablet Take 1,000 mg by mouth every 6 (six) hours as needed for moderate pain.  Marland Kitchen albuterol (PROVENTIL) (2.5 MG/3ML) 0.083% nebulizer solution USE 1 VIAL IN NEBULIZER EVERY 4 HOURS AS NEEDED FOR WHEEZING (Patient taking differently: Take 2.5 mg by nebulization every 4 (four) hours as needed for wheezing or shortness of breath. )  . albuterol (VENTOLIN HFA) 108 (90 Base) MCG/ACT inhaler Inhale 2 puffs into the lungs every 6 (six) hours as needed for wheezi ng orshortness of breath.  . ALPRAZolam (XANAX) 1 MG tablet TAKE 1 TABLET AT BEDTIME AS NEEDED FOR SLEEP  . Fluticasone-Salmeterol (ADVAIR DISKUS) 500-50 MCG/DOSE AEPB 1 PUFF TWICE DAILY. RINSE MOUTH WITH WATER AFTER EACH USE. (Patient taking differently: Inhale 1 puff into the lungs 2 (two) times a day. )  . glipiZIDE (GLUCOTROL XL) 10 MG 24 hr tablet Take 1 tablet (10 mg total) by mouth daily.  Marland Kitchen ibuprofen (ADVIL) 200 MG tablet Take 400 mg by mouth every 6 (six) hours as needed for moderate pain.  Marland Kitchen insulin glargine (LANTUS SOLOSTAR) 100 UNIT/ML Solostar Pen INJECT 42 UNITS SQ EVERY EVENING FOR DIABETES  . ketoconazole  (NIZORAL) 2 % cream Apply bid to rash (Patient taking differently: Apply 1 application topically 2 (two) times daily as needed (rash). )  . levothyroxine (SYNTHROID) 75 MCG tablet TAKE 1 TABLET DAILY  . lisinopril (ZESTRIL) 5 MG tablet TAKE (1) TABLET DAILY FOR HIGH BLOOD PRESSURE.  . metFORMIN (GLUCOPHAGE) 500 MG tablet TAKE 2 TABLETS DAILY WITH A MEAL  . mometasone (ELOCON) 0.1 % cream Apply 1 application topically daily. (Patient taking differently: Apply 1 application topically daily as needed (rash). )  . montelukast (SINGULAIR) 10 MG tablet Take 1 tablet (10 mg total) by mouth daily.  Marland Kitchen omeprazole (PRILOSEC) 40 MG capsule Take 1 capsule (40 mg total) by mouth daily.  . ondansetron (ZOFRAN ODT) 4 MG disintegrating tablet Take 1 tablet (4 mg total) by mouth every 8 (eight) hours as needed for nausea or vomiting. (Patient taking differently: Take 4 mg by mouth every 6 (six) hours as needed for nausea or vomiting. )  . OVER THE COUNTER MEDICATION Flonase PRN  . phenylephrine (SUDAFED PE) 10 MG TABS tablet Take 10 mg by mouth every 4 (four) hours as needed (sinus congestion).  . rosuvastatin (CRESTOR) 20 MG tablet TAKE 1 TABLET DAILY  . triamcinolone cream (KENALOG) 0.1 % Apply 1 application topically 2 (two) times daily as needed (dry skin).  Marland Kitchen ULTICARE SHORT PEN NEEDLES 31G X 8 MM MISC USE AS DIRECTED   No current facility-administered medications for this visit. (Other)      REVIEW OF SYSTEMS:  ALLERGIES No Known Allergies  PAST MEDICAL HISTORY Past Medical History:  Diagnosis Date  . Allergy   . Asthma   . Cancer ([place])    cervical  . Complication of anesthesia    " I woke and was short of breath."  . CTS (carpal tunnel syndrome)   . Diabetes mellitus without complication ([place])   . Early cataracts, bilateral   . Fracture    trimalleolar ankle left  . GERD (gastroesophageal reflux disease)   . Glaucoma   . Glaucoma   . History of kidney stones    " I passed it"  .  Hyperlipidemia   . Hypertension   . Hypothyroidism   . Neuropathy   . Vitamin D deficiency   . Wears glasses    Past Surgical History:  Procedure Laterality Date  . ABDOMINAL HYSTERECTOMY    . APPENDECTOMY    . CARDIAC CATHETERIZATION  2006   negative  . COLONOSCOPY  2009   negative  . INNER EAR SURGERY Right 1981  . left shoulder surgery Left 10/2009  . ORIF ANKLE FRACTURE Left 09/05/2018   Procedure: OPEN REDUCTION INTERNAL FIXATION TRIMALLEOLAR FRACTURE POSSIBLE SYNDEMOSIS;  Surgeon: Erle Crocker, MD;  Location: [facility];  Service: Orthopedics;  Laterality: Left;  . TONSILLECTOMY    . TUBAL LIGATION      FAMILY HISTORY Family History  Problem Relation Age of Onset  . Diabetes Mother   . Prostate cancer Father   . Heart disease Father   . Heart attack Other     SOCIAL HISTORY Social History   Tobacco Use  . Smoking status: Former Smoker    Packs/day: 1.00    Years: 15.00    Pack years: 15.00    Types: Cigarettes    Quit date: 06/05/2001    Years since quitting: 18.9  . Smokeless tobacco: Never Used  Vaping Use  . Vaping Use: Never used  Substance Use Topics  . Alcohol use: No  . Drug use: No         OPHTHALMIC EXAM:  Not recorded     IMAGING AND PROCEDURES  Imaging and Procedures for 05/25/2020           ASSESSMENT/PLAN:  No diagnosis found.  1,2.Moderate nonproliferative diabetic retinopathy w/o DME, OU - The incidence, risk factors for progression, natural history and treatment options for diabetic retinopathy were discussed with patient.   - The need for close monitoring of blood glucose, blood pressure, and serum lipids, avoiding cigarette or any type of tobacco, and the need for long term follow up was also discussed with patient. - exam w/ scattered MA/DBH OU - OCT without diabetic macular edema, OU -- +tr cystic changes OS - FA 12.8.21 (prior to check out) shows leaking MA, no NV OU - f/u in 3 mos -- DFE/OCT, review FA  3,4.  Hypertensive retinopathy OU - discussed importance of tight BP control - monitor  5. Pseudophakia OU  - s/p CE/IOL (Dr. Herbert Deaner; OS: 08.21, OD: 10.21  - IOL in good position, doing well  - monitor     Ophthalmic Meds Ordered this visit:  No orders of the defined types were placed in this encounter.      No follow-ups on file.  There are no Patient Instructions on file for this visit.   Explained the diagnoses, plan, and follow up with the patient and they expressed understanding.  Patient expressed understanding of the importance of proper follow up care.   This  document serves as a record of services personally performed by Gardiner Sleeper, MD, PhD. It was created on their behalf by Estill Bakes, COT an ophthalmic technician. The creation of this record is the provider's dictation and/or activities during the visit.    Electronically signed by: Estill Bakes, COT 3.1.22 @ 8:57 AM  Abbreviations: M myopia (nearsighted); A astigmatism; H hyperopia (farsighted); P presbyopia; Mrx spectacle prescription;  CTL contact lenses; OD right eye; OS left eye; OU both eyes  XT exotropia; ET esotropia; PEK punctate epithelial keratitis; PEE punctate epithelial erosions; DES dry eye syndrome; MGD meibomian gland dysfunction; ATs artificial tears; PFAT's preservative free artificial tears; [place] nuclear sclerotic cataract; PSC posterior subcapsular cataract; ERM epi-retinal membrane; PVD posterior vitreous detachment; RD retinal detachment; DM diabetes mellitus; DR diabetic retinopathy; NPDR non-proliferative diabetic retinopathy; PDR proliferative diabetic retinopathy; CSME clinically significant macular edema; DME diabetic macular edema; dbh dot blot hemorrhages; CWS cotton wool spot; POAG primary open angle glaucoma; C/D cup-to-disc ratio; HVF humphrey visual field; GVF goldmann visual field; OCT optical coherence tomography; IOP intraocular pressure; BRVO Branch retinal vein occlusion; CRVO central  retinal vein occlusion; CRAO central retinal artery occlusion; BRAO branch retinal artery occlusion; RT retinal tear; SB scleral buckle; PPV pars plana vitrectomy; VH Vitreous hemorrhage; PRP panretinal laser photocoagulation; IVK intravitreal kenalog; VMT vitreomacular traction; MH Macular hole;  NVD neovascularization of the disc; NVE neovascularization elsewhere; AREDS age related eye disease study; ARMD age related macular degeneration; POAG primary open angle glaucoma; EBMD epithelial/anterior basement membrane dystrophy; ACIOL anterior chamber intraocular lens; IOL intraocular lens; PCIOL posterior chamber intraocular lens; Phaco/IOL phacoemulsification with intraocular lens placement; [place] photorefractive keratectomy; LASIK laser assisted in situ keratomileusis; HTN hypertension; DM diabetes mellitus; COPD chronic obstructive pulmonary disease

## 2020-05-25 ENCOUNTER — Encounter (INDEPENDENT_AMBULATORY_CARE_PROVIDER_SITE_OTHER): Payer: Self-pay

## 2020-05-25 ENCOUNTER — Encounter (INDEPENDENT_AMBULATORY_CARE_PROVIDER_SITE_OTHER): Payer: Medicare Other | Admitting: Ophthalmology

## 2020-05-25 DIAGNOSIS — Z961 Presence of intraocular lens: Secondary | ICD-10-CM

## 2020-05-25 DIAGNOSIS — E113393 Type 2 diabetes mellitus with moderate nonproliferative diabetic retinopathy without macular edema, bilateral: Secondary | ICD-10-CM

## 2020-05-25 DIAGNOSIS — H35033 Hypertensive retinopathy, bilateral: Secondary | ICD-10-CM

## 2020-05-25 DIAGNOSIS — I10 Essential (primary) hypertension: Secondary | ICD-10-CM

## 2020-05-25 DIAGNOSIS — H3581 Retinal edema: Secondary | ICD-10-CM

## 2020-06-01 ENCOUNTER — Other Ambulatory Visit: Payer: Self-pay | Admitting: Family Medicine

## 2020-07-10 ENCOUNTER — Other Ambulatory Visit: Payer: Self-pay | Admitting: Family Medicine

## 2020-07-12 NOTE — Telephone Encounter (Signed)
Moved records to new doctor no longer our patient

## 2020-07-12 NOTE — Telephone Encounter (Signed)
Can someone check to see who the pcp is. Does she still come to this office? It has pcp none

## 2020-08-13 ENCOUNTER — Other Ambulatory Visit: Payer: Self-pay | Admitting: Family Medicine

## 2020-08-13 DIAGNOSIS — E1140 Type 2 diabetes mellitus with diabetic neuropathy, unspecified: Secondary | ICD-10-CM

## 2020-08-13 DIAGNOSIS — Z794 Long term (current) use of insulin: Secondary | ICD-10-CM

## 2021-01-26 ENCOUNTER — Ambulatory Visit (INDEPENDENT_AMBULATORY_CARE_PROVIDER_SITE_OTHER): Payer: Medicare Other | Admitting: Family Medicine

## 2021-01-26 ENCOUNTER — Other Ambulatory Visit: Payer: Self-pay

## 2021-01-26 ENCOUNTER — Encounter: Payer: Self-pay | Admitting: Family Medicine

## 2021-01-26 VITALS — BP 109/65 | HR 100 | Temp 97.5°F | Ht 62.0 in | Wt 149.0 lb

## 2021-01-26 DIAGNOSIS — Z794 Long term (current) use of insulin: Secondary | ICD-10-CM

## 2021-01-26 DIAGNOSIS — E785 Hyperlipidemia, unspecified: Secondary | ICD-10-CM | POA: Diagnosis not present

## 2021-01-26 DIAGNOSIS — E039 Hypothyroidism, unspecified: Secondary | ICD-10-CM

## 2021-01-26 DIAGNOSIS — I152 Hypertension secondary to endocrine disorders: Secondary | ICD-10-CM | POA: Insufficient documentation

## 2021-01-26 DIAGNOSIS — E1159 Type 2 diabetes mellitus with other circulatory complications: Secondary | ICD-10-CM

## 2021-01-26 DIAGNOSIS — E1169 Type 2 diabetes mellitus with other specified complication: Secondary | ICD-10-CM | POA: Diagnosis not present

## 2021-01-26 DIAGNOSIS — E1140 Type 2 diabetes mellitus with diabetic neuropathy, unspecified: Secondary | ICD-10-CM | POA: Insufficient documentation

## 2021-01-26 DIAGNOSIS — E559 Vitamin D deficiency, unspecified: Secondary | ICD-10-CM

## 2021-01-26 DIAGNOSIS — K3184 Gastroparesis: Secondary | ICD-10-CM

## 2021-01-26 DIAGNOSIS — E1143 Type 2 diabetes mellitus with diabetic autonomic (poly)neuropathy: Secondary | ICD-10-CM | POA: Diagnosis not present

## 2021-01-26 DIAGNOSIS — E113393 Type 2 diabetes mellitus with moderate nonproliferative diabetic retinopathy without macular edema, bilateral: Secondary | ICD-10-CM | POA: Insufficient documentation

## 2021-01-26 DIAGNOSIS — E1142 Type 2 diabetes mellitus with diabetic polyneuropathy: Secondary | ICD-10-CM

## 2021-01-26 LAB — BAYER DCA HB A1C WAIVED: HB A1C (BAYER DCA - WAIVED): 6.3 % — ABNORMAL HIGH (ref 4.8–5.6)

## 2021-01-26 NOTE — Progress Notes (Signed)
Subjective:  Patient ID: Maria [name], female    DOB: 1955-12-22, 65 y.o.   MRN: [id number]  Patient Care Team: Baruch Gouty, FNP as PCP - General (Family Medicine)   Chief Complaint:  New Patient (Initial Visit) (Diabetic, thyroid management)   HPI: Maria [name] is a 65 y.o. female presenting on 01/26/2021 for New Patient (Initial Visit) (Diabetic, thyroid management)   Patient presents today to establish care with new PCP.  She was formally followed [facility] and those records have been requested.  States she was last seen in the office in September.  A1c was 7.5 at this time.  She does have a history of poorly controlled type 2 diabetes with associated hypertension, hyperlipidemia, neuropathy, retinopathy and gastroparesis.  She also has a history of acquired hypothyroidism and vitamin D deficiency.  She has asthma and is a former smoker.  States she smoked socially for approximately 10 years, quit at least 15 years ago.  She has not had a mammogram or colonoscopy and would like to discuss this at a later date.  She does see the retinal specialist and ophthalmology on a regular basis.  She does not see podiatry but reports feet are in perfect shape.  She denies any polyuria, polyphagia, or polydipsia.  She states she was on Ozempic and after the third dose she woke up in the middle of the night feeling very faint and weak so this was stopped.  Her Lantus was not stopped.  She is compliant with all of her medications without any adverse side effects.  She is on statin therapy but is not on aspirin.  She is on an ACE inhibitor.  She has no specific complaints or concerns today, would just like to get established with new provider.  She denies need for medication refills.    Relevant past medical, surgical, family, and social history reviewed and updated as indicated.  Allergies and medications reviewed and updated. Data reviewed: Chart in Epic.   Past Medical History:  Diagnosis  Date   Allergy    Asthma    Cancer ([place])    cervical   Complication of anesthesia    " I woke and was short of breath."   CTS (carpal tunnel syndrome)    Diabetes mellitus without complication ([place])    Early cataracts, bilateral    Fracture    trimalleolar ankle left   GERD (gastroesophageal reflux disease)    Glaucoma    Glaucoma    History of kidney stones    " I passed it"   Hyperlipidemia    Hypertension    Hypothyroidism    Neuropathy    Vitamin D deficiency    Wears glasses     Past Surgical History:  Procedure Laterality Date   ABDOMINAL HYSTERECTOMY     APPENDECTOMY     CARDIAC CATHETERIZATION  2006   negative   COLONOSCOPY  2009   negative   INNER EAR SURGERY Right 1981   left shoulder surgery Left 10/2009   ORIF ANKLE FRACTURE Left 09/05/2018   Procedure: OPEN REDUCTION INTERNAL FIXATION TRIMALLEOLAR FRACTURE POSSIBLE SYNDEMOSIS;  Surgeon: Erle Crocker, MD;  Location: [facility];  Service: Orthopedics;  Laterality: Left;   TONSILLECTOMY     TUBAL LIGATION      Social History   Socioeconomic History   Marital status: Married    Spouse name: Not on file   Number of children: Not on file   Years  of education: Not on file   Highest education level: Not on file  Occupational History   Not on file  Tobacco Use   Smoking status: Former    Packs/day: 1.00    Years: 15.00    Pack years: 15.00    Types: Cigarettes    Quit date: 06/05/2001    Years since quitting: 19.6   Smokeless tobacco: Never  Vaping Use   Vaping Use: Never used  Substance and Sexual Activity   Alcohol use: No   Drug use: No   Sexual activity: Not on file  Other Topics Concern   Not on file  Social History Narrative   Not on file   Social Determinants of Health   Financial Resource Strain: Not on file  Food Insecurity: Not on file  Transportation Needs: Not on file  Physical Activity: Not on file  Stress: Not on file  Social Connections: Not on file  Intimate Partner  Violence: Not on file    Outpatient Encounter Medications as of 01/26/2021  Medication Sig   acetaminophen (TYLENOL) 500 MG tablet Take 1,000 mg by mouth every 6 (six) hours as needed for moderate pain.   albuterol (PROVENTIL) (2.5 MG/3ML) 0.083% nebulizer solution USE 1 VIAL IN NEBULIZER EVERY 4 HOURS AS NEEDED FOR WHEEZING (Patient taking differently: Take 2.5 mg by nebulization every 4 (four) hours as needed for wheezing or shortness of breath.)   albuterol (VENTOLIN HFA) 108 (90 Base) MCG/ACT inhaler Inhale 2 puffs into the lungs every 6 (six) hours as needed for wheezi ng orshortness of breath.   ALPRAZolam (XANAX) 1 MG tablet TAKE 1 TABLET AT BEDTIME AS NEEDED FOR SLEEP   glipiZIDE (GLUCOTROL XL) 10 MG 24 hr tablet Take 1 tablet (10 mg total) by mouth daily.   ibuprofen (ADVIL) 200 MG tablet Take 400 mg by mouth every 6 (six) hours as needed for moderate pain.   insulin glargine (LANTUS SOLOSTAR) 100 UNIT/ML Solostar Pen INJECT 42 UNITS SQ EVERY EVENING FOR DIABETES   ketoconazole (NIZORAL) 2 % cream Apply bid to rash (Patient taking differently: Apply 1 application topically 2 (two) times daily as needed (rash).)   levothyroxine (SYNTHROID) 75 MCG tablet TAKE 1 TABLET DAILY   lisinopril (ZESTRIL) 5 MG tablet TAKE (1) TABLET DAILY FOR HIGH BLOOD PRESSURE.   LUMIGAN 0.01 % SOLN Place 1 drop into both eyes at bedtime.    metFORMIN (GLUCOPHAGE) 500 MG tablet TAKE 2 TABLETS DAILY WITH A MEAL   mometasone (ELOCON) 0.1 % cream Apply 1 application topically daily. (Patient taking differently: Apply 1 application topically daily as needed (rash).)   montelukast (SINGULAIR) 10 MG tablet Take 1 tablet (10 mg total) by mouth daily.   omeprazole (PRILOSEC) 40 MG capsule Take 1 capsule (40 mg total) by mouth daily.   ondansetron (ZOFRAN ODT) 4 MG disintegrating tablet Take 1 tablet (4 mg total) by mouth every 8 (eight) hours as needed for nausea or vomiting. (Patient taking differently: Take 4 mg by  mouth every 6 (six) hours as needed for nausea or vomiting.)   phenylephrine (SUDAFED PE) 10 MG TABS tablet Take 10 mg by mouth every 4 (four) hours as needed (sinus congestion).   rosuvastatin (CRESTOR) 20 MG tablet TAKE 1 TABLET DAILY   triamcinolone cream (KENALOG) 0.1 % Apply 1 application topically 2 (two) times daily as needed (dry skin).   ULTICARE SHORT PEN NEEDLES 31G X 8 MM MISC USE AS DIRECTED   [DISCONTINUED] Fluticasone-Salmeterol (ADVAIR DISKUS) 500-50 MCG/DOSE AEPB  1 PUFF TWICE DAILY. RINSE MOUTH WITH WATER AFTER EACH USE. (Patient taking differently: Inhale 1 puff into the lungs 2 (two) times a day.)   [DISCONTINUED] OVER THE COUNTER MEDICATION Flonase PRN   Fluticasone-Umeclidin-Vilant (TRELEGY ELLIPTA) 200-62.5-25 MCG/ACT AEPB INHALE 1 PUFF ONCE DAILY   ketorolac (ACULAR) 0.4 % SOLN Apply to eye.   No facility-administered encounter medications on file as of 01/26/2021.    No Known Allergies  Review of Systems  Constitutional:  Negative for activity change, appetite change, chills, diaphoresis, fatigue, fever and unexpected weight change.  HENT: Negative.    Eyes:  Positive for visual disturbance. Negative for photophobia, pain, discharge, redness and itching.  Respiratory:  Negative for cough, chest tightness and shortness of breath.   Cardiovascular:  Negative for chest pain, palpitations and leg swelling.  Gastrointestinal:  Negative for abdominal pain, blood in stool, constipation, diarrhea, nausea and vomiting.  Endocrine: Negative.   Genitourinary:  Negative for decreased urine volume, difficulty urinating, dysuria, frequency and urgency.  Musculoskeletal:  Negative for arthralgias and myalgias.  Skin: Negative.   Allergic/Immunologic: Negative.   Neurological:  Negative for dizziness, tremors, seizures, syncope, facial asymmetry, speech difficulty, weakness, light-headedness, numbness and headaches.  Hematological: Negative.   Psychiatric/Behavioral:  Negative for  confusion, hallucinations, sleep disturbance and suicidal ideas.   All other systems reviewed and are negative.      Objective:  BP 109/65   Pulse 100   Temp (!) 97.5 F (36.4 C)   Ht $R'5\' 2"'AV$  (1.575 m)   Wt 149 lb (67.6 kg)   SpO2 95%   BMI 27.25 kg/m    Wt Readings from Last 3 Encounters:  01/26/21 149 lb (67.6 kg)  12/05/18 157 lb (71.2 kg)  09/05/18 155 lb 6.4 oz (70.5 kg)    Physical Exam Vitals and nursing note reviewed.  Constitutional:      General: She is not in acute distress.    Appearance: Normal appearance. She is well-developed, well-groomed and overweight. She is not ill-appearing, toxic-appearing or diaphoretic.  HENT:     Head: Normocephalic and atraumatic.     Jaw: There is normal jaw occlusion.     Right Ear: Hearing normal.     Left Ear: Hearing normal.     Nose: Nose normal.     Mouth/Throat:     Lips: Pink.     Mouth: Mucous membranes are moist.     Pharynx: Oropharynx is clear. Uvula midline.  Eyes:     General: Lids are normal.     Extraocular Movements: Extraocular movements intact.     Conjunctiva/sclera: Conjunctivae normal.     Pupils: Pupils are equal, round, and reactive to light.  Neck:     Thyroid: No thyroid mass, thyromegaly or thyroid tenderness.     Vascular: No carotid bruit or JVD.     Trachea: Trachea and phonation normal.  Cardiovascular:     Rate and Rhythm: Normal rate and regular rhythm.     Chest Wall: PMI is not displaced.     Pulses: Normal pulses.          Dorsalis pedis pulses are 2+ on the right side and 2+ on the left side.       Posterior tibial pulses are 2+ on the right side and 2+ on the left side.     Heart sounds: Normal heart sounds. No murmur heard.   No friction rub. No gallop.  Pulmonary:     Effort: Pulmonary effort is normal. No  respiratory distress.     Breath sounds: Normal breath sounds. No wheezing.  Abdominal:     General: Bowel sounds are normal. There is no distension or abdominal bruit.      Palpations: Abdomen is soft. There is no hepatomegaly or splenomegaly.     Tenderness: There is no abdominal tenderness. There is no right CVA tenderness or left CVA tenderness.     Hernia: No hernia is present.  Musculoskeletal:        General: Normal range of motion.     Cervical back: Normal range of motion and neck supple.     Right lower leg: No edema.     Left lower leg: No edema.     Right foot: Normal range of motion. No deformity, bunion, Charcot foot, foot drop or prominent metatarsal heads.     Left foot: Normal range of motion. No deformity, bunion, Charcot foot, foot drop or prominent metatarsal heads.  Feet:     Right foot:     Protective Sensation: 10 sites tested.  10 sites sensed.     Skin integrity: Skin integrity normal.     Toenail Condition: Right toenails are normal.     Left foot:     Protective Sensation: 10 sites tested.  10 sites sensed.     Skin integrity: Skin integrity normal.     Toenail Condition: Left toenails are normal.  Lymphadenopathy:     Cervical: No cervical adenopathy.  Skin:    General: Skin is warm and dry.     Capillary Refill: Capillary refill takes less than 2 seconds.     Coloration: Skin is not cyanotic, jaundiced or pale.     Findings: No rash.  Neurological:     General: No focal deficit present.     Mental Status: She is alert and oriented to person, place, and time.     Cranial Nerves: No cranial nerve deficit.     Sensory: Sensation is intact.     Motor: Motor function is intact. No weakness.     Coordination: Coordination is intact.     Gait: Gait is intact. Gait normal.     Deep Tendon Reflexes: Reflexes are normal and symmetric.  Psychiatric:        Attention and Perception: Attention and perception normal.        Mood and Affect: Mood and affect normal.        Speech: Speech normal.        Behavior: Behavior normal. Behavior is cooperative.        Thought Content: Thought content normal.        Cognition and Memory:  Cognition and memory normal.        Judgment: Judgment normal.    Results for orders placed or performed in visit on 01/28/20  HM DIABETES EYE EXAM  Result Value Ref Range   HM Diabetic Eye Exam Retinopathy (A) No Retinopathy       Pertinent labs & imaging results that were available during my care of the patient were reviewed by me and considered in my medical decision making.  Assessment & Plan:  Shamari was seen today for new patient (initial visit).  Diagnoses and all orders for this visit:  Acquired hypothyroidism Will check labs today and adjust repletion therapy if warranted. -     Thyroid Panel With TSH  Hypertension associated with diabetes ([place]) Well-controlled on current regimen, continue.  Labs pending. DASH diet and exercise encouraged. -  CBC with Differential/Platelet -     CMP14+EGFR -     Microalbumin / creatinine urine ratio -     Thyroid Panel With TSH  Hyperlipidemia associated with type 2 diabetes mellitus ([place]) On statin therapy.  Diet and exercise discussed in detail.  Will check below labs. -     CMP14+EGFR -     Lipid panel  Diabetic polyneuropathy associated with type 2 diabetes mellitus ([facility]) No specific complaints or concerns of neuropathy in office today.  Foot exam unremarkable.  Type 2 diabetes mellitus with other specified complication, with long-term current use of insulin ([place]) Labs pending.  We will plan to meet back in 1 month and adjust diabetic regimen as warranted.  Patient aware that we will try to get her off of Lantus and back onto Ozempic.  Diet and exercise discussed in detail. -     CBC with Differential/Platelet -     CMP14+EGFR -     Microalbumin / creatinine urine ratio -     Lipid panel -     Thyroid Panel With TSH -     Bayer DCA Hb A1c Waived  Diabetic gastroparesis associated with type 2 diabetes mellitus ([place]) Reports resolution of symptoms years ago.  No recurrent abdominal pain, nausea or vomiting.  Vitamin D  deficiency On repletion therapy.  We will check levels today and adjust regimen if warranted. -     VITAMIN D 25 Hydroxy (Vit-D Deficiency, Fractures)     Continue all other maintenance medications.  Follow up plan: Return in about 1 month (around 02/25/2021) for DM.   Continue healthy lifestyle choices, including diet (rich in fruits, vegetables, and lean proteins, and low in salt and simple carbohydrates) and exercise (at least 30 minutes of moderate physical activity daily).  Educational handout given for DM  The above assessment and management plan was discussed with the patient. The patient verbalized understanding of and has agreed to the management plan. Patient is aware to call the clinic if they develop any new symptoms or if symptoms persist or worsen. Patient is aware when to return to the clinic for a follow-up visit. Patient educated on when it is appropriate to go to the emergency department.   [name], FNP-C [facility] [phone]

## 2021-01-26 NOTE — Patient Instructions (Signed)

## 2021-01-27 LAB — LIPID PANEL
Chol/HDL Ratio: 3 ratio (ref 0.0–4.4)
Cholesterol, Total: 116 mg/dL (ref 100–199)
HDL: 39 mg/dL — ABNORMAL LOW (ref >39)
LDL Chol Calc (NIH): 47 mg/dL (ref 0–99)
Triglycerides: 183 mg/dL — ABNORMAL HIGH (ref 0–149)
VLDL Cholesterol Cal: 30 mg/dL (ref 5–40)

## 2021-01-27 LAB — CBC WITH DIFFERENTIAL/PLATELET
Basophils Absolute: 0 10*3/uL (ref 0.0–0.2)
Basos: 1 %
EOS (ABSOLUTE): 0.3 10*3/uL (ref 0.0–0.4)
Eos: 4 %
Hematocrit: 42.3 % (ref 34.0–46.6)
Hemoglobin: 14.4 g/dL (ref 11.1–15.9)
Immature Grans (Abs): 0 10*3/uL (ref 0.0–0.1)
Immature Granulocytes: 0 %
Lymphocytes Absolute: 1.4 10*3/uL (ref 0.7–3.1)
Lymphs: 21 %
MCH: 29.6 pg (ref 26.6–33.0)
MCHC: 34 g/dL (ref 31.5–35.7)
MCV: 87 fL (ref 79–97)
Monocytes Absolute: 0.6 10*3/uL (ref 0.1–0.9)
Monocytes: 9 %
Neutrophils Absolute: 4.3 10*3/uL (ref 1.4–7.0)
Neutrophils: 65 %
Platelets: 237 10*3/uL (ref 150–450)
RBC: 4.87 x10E6/uL (ref 3.77–5.28)
RDW: 12.9 % (ref 11.7–15.4)
WBC: 6.5 10*3/uL (ref 3.4–10.8)

## 2021-01-27 LAB — CMP14+EGFR
ALT: 15 IU/L (ref 0–32)
AST: 21 IU/L (ref 0–40)
Albumin/Globulin Ratio: 2 (ref 1.2–2.2)
Albumin: 4.7 g/dL (ref 3.8–4.8)
Alkaline Phosphatase: 131 IU/L — ABNORMAL HIGH (ref 44–121)
BUN/Creatinine Ratio: 12 (ref 12–28)
BUN: 11 mg/dL (ref 8–27)
Bilirubin Total: 0.7 mg/dL (ref 0.0–1.2)
CO2: 21 mmol/L (ref 20–29)
Calcium: 9.3 mg/dL (ref 8.7–10.3)
Chloride: 104 mmol/L (ref 96–106)
Creatinine, Ser: 0.95 mg/dL (ref 0.57–1.00)
Globulin, Total: 2.3 g/dL (ref 1.5–4.5)
Glucose: 102 mg/dL — ABNORMAL HIGH (ref 70–99)
Potassium: 4.3 mmol/L (ref 3.5–5.2)
Sodium: 140 mmol/L (ref 134–144)
Total Protein: 7 g/dL (ref 6.0–8.5)
eGFR: 67 mL/min/{1.73_m2} (ref >59)

## 2021-01-27 LAB — MICROALBUMIN / CREATININE URINE RATIO
Creatinine, Urine: 107.8 mg/dL
Microalb/Creat Ratio: 63 mg/g creat — ABNORMAL HIGH (ref 0–29)
Microalbumin, Urine: 68.1 ug/mL

## 2021-01-27 LAB — VITAMIN D 25 HYDROXY (VIT D DEFICIENCY, FRACTURES): Vit D, 25-Hydroxy: 50 ng/mL (ref 30.0–100.0)

## 2021-01-27 LAB — THYROID PANEL WITH TSH
Free Thyroxine Index: 3.4 (ref 1.2–4.9)
T3 Uptake Ratio: 30 % (ref 24–39)
T4, Total: 11.4 ug/dL (ref 4.5–12.0)
TSH: 0.207 u[IU]/mL — ABNORMAL LOW (ref 0.450–4.500)

## 2021-01-27 MED ORDER — LEVOTHYROXINE SODIUM 50 MCG PO TABS: 50.0000 ug | ORAL_TABLET | Freq: Every day | ORAL | 3 refills | Status: DC

## 2021-01-27 NOTE — Addendum Note (Signed)
Addended by: Baruch Gouty on: 01/27/2021 08:00 AM   Modules accepted: Orders

## 2021-01-28 ENCOUNTER — Telehealth: Payer: Self-pay | Admitting: Family Medicine

## 2021-01-31 NOTE — Telephone Encounter (Signed)
Patient aware and verbalized understanding. °

## 2021-02-07 ENCOUNTER — Other Ambulatory Visit: Payer: Self-pay | Admitting: Family Medicine

## 2021-02-08 NOTE — Telephone Encounter (Signed)
These were filled by previous provider-ok to refill? Xanax was denied already but wasn't sure about the other ones.

## 2021-02-16 ENCOUNTER — Ambulatory Visit (INDEPENDENT_AMBULATORY_CARE_PROVIDER_SITE_OTHER): Payer: Medicare Other

## 2021-02-16 VITALS — Ht 62.0 in | Wt 149.0 lb

## 2021-02-16 DIAGNOSIS — Z0000 Encounter for general adult medical examination without abnormal findings: Secondary | ICD-10-CM

## 2021-02-16 NOTE — Progress Notes (Signed)
Subjective:   Maria [name] is a 65 y.o. female who presents for Medicare Annual (Subsequent) preventive examination. Virtual Visit via Telephone Note  I connected with  Maria [name] on 02/16/21 at  1:15 PM EST by telephone and verified that I am speaking with the correct person using two identifiers.  Location: Patient: Home Provider: [facility] Persons participating in the virtual visit: patient/Nurse Health Advisor   I discussed the limitations, risks, security and privacy concerns of performing an evaluation and management service by telephone and the availability of in person appointments. The patient expressed understanding and agreed to proceed.  Interactive audio and video telecommunications were attempted between this nurse and patient, however failed, due to patient having technical difficulties OR patient did not have access to video capability.  We continued and completed visit with audio only.  Some vital signs may be absent or patient reported.   Chriss Driver, LPN  Review of Systems     Cardiac Risk Factors include: diabetes mellitus;hypertension;dyslipidemia;sedentary lifestyle     Objective:    Today's Vitals   02/16/21 1308  Weight: 149 lb (67.6 kg)  Height: 5\' 2"  (1.575 m)   Body mass index is 27.25 kg/m.  Advanced Directives 02/16/2021 09/04/2018 08/22/2018 04/08/2017 06/22/2014  Does Patient Have a Medical Advance Directive? No No No No No  Would patient like information on creating a medical advance directive? No - Patient declined No - Patient declined No - Patient declined - No - patient declined information    Current Medications (verified) Outpatient Encounter Medications as of 02/16/2021  Medication Sig   acetaminophen (TYLENOL) 500 MG tablet Take 1,000 mg by mouth every 6 (six) hours as needed for moderate pain.   albuterol (PROVENTIL) (2.5 MG/3ML) 0.083% nebulizer solution USE 1 VIAL IN NEBULIZER EVERY 4 HOURS AS NEEDED FOR WHEEZING (Patient  taking differently: Take 2.5 mg by nebulization every 4 (four) hours as needed for wheezing or shortness of breath.)   albuterol (VENTOLIN HFA) 108 (90 Base) MCG/ACT inhaler Inhale 2 puffs into the lungs every 6 (six) hours as needed for wheezi ng orshortness of breath.   ALPRAZolam (XANAX) 1 MG tablet TAKE 1 TABLET AT BEDTIME AS NEEDED FOR SLEEP   Fluticasone-Umeclidin-Vilant 200-62.5-25 MCG/ACT AEPB INHALE 1 PUFF ONCE DAILY   glipiZIDE (GLUCOTROL XL) 10 MG 24 hr tablet Take 1 tablet (10 mg total) by mouth daily.   ibuprofen (ADVIL) 200 MG tablet Take 400 mg by mouth every 6 (six) hours as needed for moderate pain.   insulin glargine (LANTUS SOLOSTAR) 100 UNIT/ML Solostar Pen INJECT 42 UNITS SQ EVERY EVENING FOR DIABETES   ketoconazole (NIZORAL) 2 % cream Apply bid to rash (Patient taking differently: Apply 1 application topically 2 (two) times daily as needed (rash).)   ketorolac (ACULAR) 0.4 % SOLN Apply to eye.   levothyroxine (SYNTHROID) 50 MCG tablet Take 1 tablet (50 mcg total) by mouth daily.   lisinopril (ZESTRIL) 5 MG tablet TAKE (1) TABLET DAILY FOR HIGH BLOOD PRESSURE.   LUMIGAN 0.01 % SOLN Place 1 drop into both eyes at bedtime.    metFORMIN (GLUCOPHAGE) 500 MG tablet TAKE 2 TABLETS DAILY WITH A MEAL   mometasone (ELOCON) 0.1 % cream Apply 1 application topically daily. (Patient taking differently: Apply 1 application topically daily as needed (rash).)   montelukast (SINGULAIR) 10 MG tablet TAKE 1 TABLET DAILY   omeprazole (PRILOSEC) 40 MG capsule TAKE 1 CAPSULE ONCE DAILY 30 MINUTES BEFORE MEALS   ondansetron (ZOFRAN  ODT) 4 MG disintegrating tablet Take 1 tablet (4 mg total) by mouth every 8 (eight) hours as needed for nausea or vomiting. (Patient taking differently: Take 4 mg by mouth every 6 (six) hours as needed for nausea or vomiting.)   phenylephrine (SUDAFED PE) 10 MG TABS tablet Take 10 mg by mouth every 4 (four) hours as needed (sinus congestion).   rosuvastatin (CRESTOR) 20  MG tablet TAKE 1 TABLET DAILY   triamcinolone cream (KENALOG) 0.1 % Apply 1 application topically 2 (two) times daily as needed (dry skin).   ULTICARE SHORT PEN NEEDLES 31G X 8 MM MISC USE AS DIRECTED   No facility-administered encounter medications on file as of 02/16/2021.    Allergies (verified) Patient has no known allergies.   History: Past Medical History:  Diagnosis Date   Allergy    Asthma    Cancer ([place])    cervical   Complication of anesthesia    " I woke and was short of breath."   CTS (carpal tunnel syndrome)    Diabetes mellitus without complication ([place])    Early cataracts, bilateral    Fracture    trimalleolar ankle left   GERD (gastroesophageal reflux disease)    Glaucoma    Glaucoma    History of kidney stones    " I passed it"   Hyperlipidemia    Hypertension    Hypothyroidism    Neuropathy    Vitamin D deficiency    Wears glasses    Past Surgical History:  Procedure Laterality Date   ABDOMINAL HYSTERECTOMY     APPENDECTOMY     CARDIAC CATHETERIZATION  2006   negative   COLONOSCOPY  2009   negative   INNER EAR SURGERY Right 1981   left shoulder surgery Left 10/2009   ORIF ANKLE FRACTURE Left 09/05/2018   Procedure: OPEN REDUCTION INTERNAL FIXATION TRIMALLEOLAR FRACTURE POSSIBLE SYNDEMOSIS;  Surgeon: Erle Crocker, MD;  Location: [facility];  Service: Orthopedics;  Laterality: Left;   TONSILLECTOMY     TUBAL LIGATION     Family History  Problem Relation Age of Onset   Diabetes Mother    Prostate cancer Father    Heart disease Father    Heart attack Other    Social History   Socioeconomic History   Marital status: Married    Spouse name: Elta Guadeloupe   Number of children: 2   Years of education: Not on file   Highest education level: Not on file  Occupational History   Not on file  Tobacco Use   Smoking status: Former    Packs/day: 1.00    Years: 15.00    Pack years: 15.00    Types: Cigarettes    Quit date: 06/05/2001    Years since  quitting: 19.7   Smokeless tobacco: Never  Vaping Use   Vaping Use: Never used  Substance and Sexual Activity   Alcohol use: No   Drug use: No   Sexual activity: Not on file  Other Topics Concern   Not on file  Social History Narrative   2 sons.   3 grandchildren.   Social Determinants of Health   Financial Resource Strain: Low Risk    Difficulty of Paying Living Expenses: Not hard at all  Food Insecurity: No Food Insecurity   Worried About Charity fundraiser in the Last Year: Never true   Ran Out of Food in the Last Year: Never true  Transportation Needs: No Transportation Needs   Lack of  Transportation (Medical): No   Lack of Transportation (Non-Medical): No  Physical Activity: Insufficiently Active   Days of Exercise per Week: 3 days   Minutes of Exercise per Session: 30 min  Stress: No Stress Concern Present   Feeling of Stress : Not at all  Social Connections: Socially Integrated   Frequency of Communication with Friends and Family: More than three times a week   Frequency of Social Gatherings with Friends and Family: More than three times a week   Attends Religious Services: 1 to 4 times per year   Active Member of [employer] or Organizations: Yes   Attends Archivist Meetings: 1 to 4 times per year   Marital Status: Married    Tobacco Counseling Counseling given: Not Answered   Clinical Intake:  Pre-visit preparation completed: Yes  Pain : No/denies pain     BMI - recorded: 27.25 Nutritional Status: BMI 25 -29 Overweight Nutritional Risks: None Diabetes: Yes CBG done?: No Did pt. bring in CBG monitor from home?: No  How often do you need to have someone help you when you read instructions, pamphlets, or other written materials from your doctor or pharmacy?: 1 - Never  Diabetic?Nutrition Risk Assessment:  Has the patient had any N/V/D within the last 2 months?  No  Does the patient have any non-healing wounds?  No  Has the patient had any  unintentional weight loss or weight gain?  No   Diabetes:  Is the patient diabetic?  Yes  If diabetic, was a CBG obtained today?  No  Did the patient bring in their glucometer from home?  No  Phone visit.  How often do you monitor your CBG's? Daily.   Financial Strains and Diabetes Management:  Are you having any financial strains with the device, your supplies or your medication? No .  Does the patient want to be seen by Chronic Care Management for management of their diabetes?  No  Would the patient like to be referred to a Nutritionist or for Diabetic Management?  No   Diabetic Exams:  Diabetic Eye Exam: Completed 01/2021.  Pt has been advised about the importance in completing this exam.  Diabetic Foot Exam: Completed 01/26/2021. Pt has been advised about the importance in completing this exam.   Interpreter Needed?: No  Information entered by :: MJ Tressa Maldonado, LPN   Activities of Daily Living In your present state of health, do you have any difficulty performing the following activities: 02/16/2021  Hearing? Y  Vision? N  Difficulty concentrating or making decisions? N  Comment Memory at times.  Walking or climbing stairs? N  Dressing or bathing? N  Doing errands, shopping? N  Preparing Food and eating ? N  Using the Toilet? N  In the past six months, have you accidently leaked urine? N  Do you have problems with loss of bowel control? N  Managing your Medications? N  Managing your Finances? N  Some recent data might be hidden    Patient Care Team: Rakes, Connye Burkitt, FNP as PCP - General (Family Medicine)  Indicate any recent Medical Services you may have received from other than [facility] providers in the past year (date may be approximate).     Assessment:   This is a routine wellness examination for Shailyn.  Hearing/Vision screen Hearing Screening - Comments:: Some hearing issues to R ear. Vision Screening - Comments:: Glasses. Dr. Hetty Blend.  03/07/2021.  Dietary issues and exercise activities discussed: Current Exercise Habits: Home exercise  routine, Type of exercise: walking, Time (Minutes): 30, Frequency (Times/Week): 3, Weekly Exercise (Minutes/Week): 90, Intensity: Mild, Exercise limited by: cardiac condition(s)   Goals Addressed             This Visit's Progress    Have 3 meals a day       Pt would like continue to eat healthy and maintain blood sugars.        Depression Screen PHQ 2/9 Scores 02/16/2021 06/03/2018 03/04/2018 12/03/2017 07/19/2017 07/19/2017 07/17/2016  PHQ - 2 Score 0 0 0 0 5 0 0  PHQ- 9 Score - - 5 3 17  - -    Fall Risk Fall Risk  02/16/2021 06/03/2018 03/04/2018 07/19/2017 07/17/2016  Falls in the past year? 0 0 0 Yes No  Number falls in past yr: 0 0 0 1 -  Injury with Fall? 0 0 0 Yes -  Risk for fall due to : Impaired vision - - History of fall(s) -  Follow up Falls prevention discussed Falls evaluation completed Education provided Education provided -    FALL RISK PREVENTION PERTAINING TO THE HOME:  Any stairs in or around the home? No  If so, are there any without handrails? No  Home free of loose throw rugs in walkways, pet beds, electrical cords, etc? Yes  Adequate lighting in your home to reduce risk of falls? Yes   ASSISTIVE DEVICES UTILIZED TO PREVENT FALLS:  Life alert? No  Use of a cane, walker or w/c? No  Grab bars in the bathroom? No  Shower chair or bench in shower? Yes  Elevated toilet seat or a handicapped toilet? No   TIMED UP AND GO:  Was the test performed? No . Phone visit.   Cognitive Function:     6CIT Screen 02/16/2021  What Year? 0 points  What month? 0 points  What time? 0 points  Count back from 20 0 points  Months in reverse 0 points  Repeat phrase 0 points  Total Score 0    Immunizations Immunization History  Administered Date(s) Administered   Influenza Split 12/12/2012   Influenza,inj,Quad PF,6+ Mos 01/07/2014, 01/04/2015, 01/13/2016,  12/03/2017, 12/14/2018   Pneumococcal-Unspecified 06/28/2001, 01/12/2009   Tdap 06/25/2013    TDAP status: Up to date  Flu Vaccine status: Declined, Education has been provided regarding the importance of this vaccine but patient still declined. Advised may receive this vaccine at local pharmacy or Health Dept. Aware to provide a copy of the vaccination record if obtained from local pharmacy or Health Dept. Verbalized acceptance and understanding.  Pneumococcal vaccine status: Up to date  Covid-19 vaccine status: Declined, Education has been provided regarding the importance of this vaccine but patient still declined. Advised may receive this vaccine at local pharmacy or Health Dept.or vaccine clinic. Aware to provide a copy of the vaccination record if obtained from local pharmacy or Health Dept. Verbalized acceptance and understanding.  Qualifies for Shingles Vaccine? Yes   Zostavax completed No   Shingrix Completed?: No.    Education has been provided regarding the importance of this vaccine. Patient has been advised to call insurance company to determine out of pocket expense if they have not yet received this vaccine. Advised may also receive vaccine at local pharmacy or Health Dept. Verbalized acceptance and understanding.  Screening Tests Health Maintenance  Topic Date Due   COVID-19 Vaccine (1) Never done   MAMMOGRAM  09/07/2019   OPHTHALMOLOGY EXAM  01/27/2021   Zoster Vaccines- Shingrix (1 of 2) 04/28/2021 (  Originally 02/21/1975)   INFLUENZA VACCINE  06/17/2021 (Originally 10/18/2020)   Pneumococcal Vaccine 17-75 Years old (1 - PCV) 01/26/2022 (Originally 02/20/1962)   COLONOSCOPY (Pts 45-60yrs Insurance coverage will need to be confirmed)  01/26/2022 (Originally 10/07/2017)   HEMOGLOBIN A1C  07/26/2021   FOOT EXAM  01/26/2022   TETANUS/TDAP  06/26/2023   Hepatitis C Screening  Completed   HIV Screening  Completed   HPV VACCINES  Aged Out    Health Maintenance  Health  Maintenance Due  Topic Date Due   COVID-19 Vaccine (1) Never done   MAMMOGRAM  09/07/2019   OPHTHALMOLOGY EXAM  01/27/2021    Colorectal cancer screening: Type of screening: Colonoscopy. Completed 10/08/2007. Repeat every 10 years  Mammogram status: Completed 2021 with [facility] per pt. Repeat every year  Bone Density status: Ordered Advised patient that we can schedule after first of year. Pt provided with contact info and advised to call to schedule appt.  Lung Cancer Screening: (Low Dose CT Chest recommended if Age 22-80 years, 30 pack-year currently smoking OR have quit w/in 15years.) does not qualify. NON SMOKER  Additional Screening:  Hepatitis C Screening: does qualify; Completed 01/03/2005  Vision Screening: Recommended annual ophthalmology exams for early detection of glaucoma and other disorders of the eye. Is the patient up to date with their annual eye exam?  Yes  Who is the provider or what is the name of the office in which the patient attends annual eye exams? Dr. Herbert Deaner If pt is not established with a provider, would they like to be referred to a provider to establish care? No .   Dental Screening: Recommended annual dental exams for proper oral hygiene  Community Resource Referral / Chronic Care Management: CRR required this visit?  No   CCM required this visit?  No      Plan:     I have personally reviewed and noted the following in the patient's chart:   Medical and social history Use of alcohol, tobacco or illicit drugs  Current medications and supplements including opioid prescriptions.  Functional ability and status Nutritional status Physical activity Advanced directives List of other physicians Hospitalizations, surgeries, and ER visits in previous 12 months Vitals Screenings to include cognitive, depression, and falls Referrals and appointments  In addition, I have reviewed and discussed with patient certain preventive protocols, quality  metrics, and best practice recommendations. A written personalized care plan for preventive services as well as general preventive health recommendations were provided to patient.     Chriss Driver, LPN   [id number]   Nurse Notes: Phone visit. Discussed flu, shingles and covid vaccines. Pt declined Colonoscopy at this time. Pt had mammogram done in 2021 with [facility] per pt. Up to date on eye exam and diabetic foot exam.

## 2021-02-16 NOTE — Patient Instructions (Signed)
Maria [name] , Thank you for taking time to come for your Medicare Wellness Visit. I appreciate your ongoing commitment to your health goals. Please review the following plan we discussed and let me know if I can assist you in the future.   Screening recommendations/referrals: Colonoscopy: Done 10/08/2007 Repeat in 10 years  Mammogram: Done 2021 at [place] Repeat annually  Bone Density: Due at age 65.   Recommended yearly ophthalmology/optometry visit for glaucoma screening and checkup Recommended yearly dental visit for hygiene and checkup  Vaccinations: Influenza vaccine: Done 12/18/20 Repeat annually  Pneumococcal vaccine: Done 12/18/20 and 01/12/2009 Tdap vaccine: Done 06/25/2013 Repeat in 10 years  Shingles vaccine: Shingrix discussed. Please contact your pharmacy for coverage information.    Covid-19: Declined  Advanced directives: Advance directive discussed with you today. Even though you declined this today, please call our office should you change your mind, and we can give you the proper paperwork for you to fill out.   Conditions/risks identified: Aim for 30 minutes of exercise or brisk walking each day, drink 6-8 glasses of water and eat lots of fruits and vegetables.   Next appointment: Follow up in one year for your annual wellness visit. 2023.  Preventive Care 40-64 Years, Female Preventive care refers to lifestyle choices and visits with your health care provider that can promote health and wellness. What does preventive care include? A yearly physical exam. This is also called an annual well check. Dental exams once or twice a year. Routine eye exams. Ask your health care provider how often you should have your eyes checked. Personal lifestyle choices, including: Daily care of your teeth and gums. Regular physical activity. Eating a healthy diet. Avoiding tobacco and drug use. Limiting alcohol use. Practicing safe sex. Taking low-dose aspirin daily starting at  age 33. Taking vitamin and mineral supplements as recommended by your health care provider. What happens during an annual well check? The services and screenings done by your health care provider during your annual well check will depend on your age, overall health, lifestyle risk factors, and family history of disease. Counseling  Your health care provider may ask you questions about your: Alcohol use. Tobacco use. Drug use. Emotional well-being. Home and relationship well-being. Sexual activity. Eating habits. Work and work Statistician. Method of birth control. Menstrual cycle. Pregnancy history. Screening  You may have the following tests or measurements: Height, weight, and BMI. Blood pressure. Lipid and cholesterol levels. These may be checked every 5 years, or more frequently if you are over 75 years old. Skin check. Lung cancer screening. You may have this screening every year starting at age 64 if you have a 30-pack-year history of smoking and currently smoke or have quit within the past 15 years. Fecal occult blood test (FOBT) of the stool. You may have this test every year starting at age 23. Flexible sigmoidoscopy or colonoscopy. You may have a sigmoidoscopy every 5 years or a colonoscopy every 10 years starting at age 58. Hepatitis C blood test. Hepatitis B blood test. Sexually transmitted disease (STD) testing. Diabetes screening. This is done by checking your blood sugar (glucose) after you have not eaten for a while (fasting). You may have this done every 1-3 years. Mammogram. This may be done every 1-2 years. Talk to your health care provider about when you should start having regular mammograms. This may depend on whether you have a family history of breast cancer. BRCA-related cancer screening. This may be done if you have a family  history of breast, ovarian, tubal, or peritoneal cancers. Pelvic exam and Pap test. This may be done every 3 years starting at age 79.  Starting at age 68, this may be done every 5 years if you have a Pap test in combination with an HPV test. Bone density scan. This is done to screen for osteoporosis. You may have this scan if you are at high risk for osteoporosis. Discuss your test results, treatment options, and if necessary, the need for more tests with your health care provider. Vaccines  Your health care provider may recommend certain vaccines, such as: Influenza vaccine. This is recommended every year. Tetanus, diphtheria, and acellular pertussis (Tdap, Td) vaccine. You may need a Td booster every 10 years. Zoster vaccine. You may need this after age 45. Pneumococcal 13-valent conjugate (PCV13) vaccine. You may need this if you have certain conditions and were not previously vaccinated. Pneumococcal polysaccharide (PPSV23) vaccine. You may need one or two doses if you smoke cigarettes or if you have certain conditions. Talk to your health care provider about which screenings and vaccines you need and how often you need them. This information is not intended to replace advice given to you by your health care provider. Make sure you discuss any questions you have with your health care provider. Document Released: 04/02/2015 Document Revised: 11/24/2015 Document Reviewed: 01/05/2015 Elsevier Interactive Patient Education  2017 [place] Prevention in the Home Falls can cause injuries. They can happen to people of all ages. There are many things you can do to make your home safe and to help prevent falls. What can I do on the outside of my home? Regularly fix the edges of walkways and driveways and fix any cracks. Remove anything that might make you trip as you walk through a door, such as a raised step or threshold. Trim any bushes or trees on the path to your home. Use bright outdoor lighting. Clear any walking paths of anything that might make someone trip, such as rocks or tools. Regularly check to see if  handrails are loose or broken. Make sure that both sides of any steps have handrails. Any raised decks and porches should have guardrails on the edges. Have any leaves, snow, or ice cleared regularly. Use sand or salt on walking paths during winter. Clean up any spills in your garage right away. This includes oil or grease spills. What can I do in the bathroom? Use night lights. Install grab bars by the toilet and in the tub and shower. Do not use towel bars as grab bars. Use non-skid mats or decals in the tub or shower. If you need to sit down in the shower, use a plastic, non-slip stool. Keep the floor dry. Clean up any water that spills on the floor as soon as it happens. Remove soap buildup in the tub or shower regularly. Attach bath mats securely with double-sided non-slip rug tape. Do not have throw rugs and other things on the floor that can make you trip. What can I do in the bedroom? Use night lights. Make sure that you have a light by your bed that is easy to reach. Do not use any sheets or blankets that are too big for your bed. They should not hang down onto the floor. Have a firm chair that has side arms. You can use this for support while you get dressed. Do not have throw rugs and other things on the floor that can make you  trip. What can I do in the kitchen? Clean up any spills right away. Avoid walking on wet floors. Keep items that you use a lot in easy-to-reach places. If you need to reach something above you, use a strong step stool that has a grab bar. Keep electrical cords out of the way. Do not use floor polish or wax that makes floors slippery. If you must use wax, use non-skid floor wax. Do not have throw rugs and other things on the floor that can make you trip. What can I do with my stairs? Do not leave any items on the stairs. Make sure that there are handrails on both sides of the stairs and use them. Fix handrails that are broken or loose. Make sure that  handrails are as long as the stairways. Check any carpeting to make sure that it is firmly attached to the stairs. Fix any carpet that is loose or worn. Avoid having throw rugs at the top or bottom of the stairs. If you do have throw rugs, attach them to the floor with carpet tape. Make sure that you have a light switch at the top of the stairs and the bottom of the stairs. If you do not have them, ask someone to add them for you. What else can I do to help prevent falls? Wear shoes that: Do not have high heels. Have rubber bottoms. Are comfortable and fit you well. Are closed at the toe. Do not wear sandals. If you use a stepladder: Make sure that it is fully opened. Do not climb a closed stepladder. Make sure that both sides of the stepladder are locked into place. Ask someone to hold it for you, if possible. Clearly mark and make sure that you can see: Any grab bars or handrails. First and last steps. Where the edge of each step is. Use tools that help you move around (mobility aids) if they are needed. These include: Canes. Walkers. Scooters. Crutches. Turn on the lights when you go into a dark area. Replace any light bulbs as soon as they burn out. Set up your furniture so you have a clear path. Avoid moving your furniture around. If any of your floors are uneven, fix them. If there are any pets around you, be aware of where they are. Review your medicines with your doctor. Some medicines can make you feel dizzy. This can increase your chance of falling. Ask your doctor what other things that you can do to help prevent falls. This information is not intended to replace advice given to you by your health care provider. Make sure you discuss any questions you have with your health care provider. Document Released: 12/31/2008 Document Revised: 08/12/2015 Document Reviewed: 04/10/2014 Elsevier Interactive Patient Education  2017 [employer].

## 2021-02-18 ENCOUNTER — Other Ambulatory Visit: Payer: Self-pay | Admitting: Family Medicine

## 2021-02-25 ENCOUNTER — Telehealth: Payer: Self-pay | Admitting: Family Medicine

## 2021-02-25 ENCOUNTER — Ambulatory Visit (INDEPENDENT_AMBULATORY_CARE_PROVIDER_SITE_OTHER): Payer: Medicare Other | Admitting: Family Medicine

## 2021-02-25 ENCOUNTER — Encounter: Payer: Self-pay | Admitting: Family Medicine

## 2021-02-25 ENCOUNTER — Other Ambulatory Visit: Payer: Self-pay | Admitting: Family Medicine

## 2021-02-25 VITALS — BP 130/64 | HR 91 | Temp 97.4°F | Ht 62.0 in | Wt 153.0 lb

## 2021-02-25 DIAGNOSIS — E1169 Type 2 diabetes mellitus with other specified complication: Secondary | ICD-10-CM | POA: Diagnosis not present

## 2021-02-25 DIAGNOSIS — E1140 Type 2 diabetes mellitus with diabetic neuropathy, unspecified: Secondary | ICD-10-CM | POA: Insufficient documentation

## 2021-02-25 DIAGNOSIS — Z794 Long term (current) use of insulin: Secondary | ICD-10-CM | POA: Diagnosis not present

## 2021-02-25 MED ORDER — LANTUS SOLOSTAR 100 UNIT/ML ~~LOC~~ SOPN: PEN_INJECTOR | SUBCUTANEOUS | 5 refills | Status: DC

## 2021-02-25 MED ORDER — OZEMPIC (0.25 OR 0.5 MG/DOSE) 2 MG/1.5ML ~~LOC~~ SOPN: 0.2500 mg | PEN_INJECTOR | SUBCUTANEOUS | 1 refills | Status: AC

## 2021-02-25 NOTE — Patient Instructions (Signed)
Start Ozempic once weekly.  Lower insulin to 20 units for 2 weeks. If blood sugar remains controlled, decrease to 10 units for 2 weeks. If still controlled can stop insulin.   Follow up in 3 months.   Continue to monitor your blood sugars as we discussed and record them. Bring the log to your next appointment.  Take your medications as directed.    Goal Blood glucose:    Fasting (before meals) = 80 to 130   Within 2 hours of eating = less than 180   Understanding your Hemoglobin A1c:     Diabetes Mellitus and Nutrition    I think that you would greatly benefit from seeing a nutritionist. If this is something you are interested in, please call Dr Jenne Campus at [phone] to schedule an appointment.   When you have diabetes (diabetes mellitus), it is very important to have healthy eating habits because your blood sugar (glucose) levels are greatly affected by what you eat and drink. Eating healthy foods in the appropriate amounts, at about the same times every day, can help you: Control your blood glucose. Lower your risk of heart disease. Improve your blood pressure. Reach or maintain a healthy weight.  Every person with diabetes is different, and each person has different needs for a meal plan. Your health care provider may recommend that you work with a diet and nutrition specialist (dietitian) to make a meal plan that is best for you. Your meal plan may vary depending on factors such as: The calories you need. The medicines you take. Your weight. Your blood glucose, blood pressure, and cholesterol levels. Your activity level. Other health conditions you have, such as heart or kidney disease.  How do carbohydrates affect me? Carbohydrates affect your blood glucose level more than any other type of food. Eating carbohydrates naturally increases the amount of glucose in your blood. Carbohydrate counting is a method for keeping track of how many carbohydrates you eat. Counting  carbohydrates is important to keep your blood glucose at a healthy level, especially if you use insulin or take certain oral diabetes medicines. It is important to know how many carbohydrates you can safely have in each meal. This is different for every person. Your dietitian can help you calculate how many carbohydrates you should have at each meal and for snack. Foods that contain carbohydrates include: Bread, cereal, rice, pasta, and crackers. Potatoes and corn. Peas, beans, and lentils. Milk and yogurt. Fruit and juice. Desserts, such as cakes, cookies, ice cream, and candy.  How does alcohol affect me? Alcohol can cause a sudden decrease in blood glucose (hypoglycemia), especially if you use insulin or take certain oral diabetes medicines. Hypoglycemia can be a life-threatening condition. Symptoms of hypoglycemia (sleepiness, dizziness, and confusion) are similar to symptoms of having too much alcohol. If your health care provider says that alcohol is safe for you, follow these guidelines: Limit alcohol intake to no more than 1 drink per day for nonpregnant women and 2 drinks per day for men. One drink equals 12 oz of beer, 5 oz of wine, or 1 oz of hard liquor. Do not drink on an empty stomach. Keep yourself hydrated with water, diet soda, or unsweetened iced tea. Keep in mind that regular soda, juice, and other mixers may contain a lot of sugar and must be counted as carbohydrates.  What are tips for following this plan?  Reading food labels Start by checking the serving size on the label. The amount of calories,  carbohydrates, fats, and other nutrients listed on the label are based on one serving of the food. Many foods contain more than one serving per package. Check the total grams (g) of carbohydrates in one serving. You can calculate the number of servings of carbohydrates in one serving by dividing the total carbohydrates by 15. For example, if a food has 30 g of total  carbohydrates, it would be equal to 2 servings of carbohydrates. Check the number of grams (g) of saturated and trans fats in one serving. Choose foods that have low or no amount of these fats. Check the number of milligrams (mg) of sodium in one serving. Most people should limit total sodium intake to less than 2,300 mg per day. Always check the nutrition information of foods labeled as "low-fat" or "nonfat". These foods may be higher in added sugar or refined carbohydrates and should be avoided. Talk to your dietitian to identify your daily goals for nutrients listed on the label.  Shopping Avoid buying canned, premade, or processed foods. These foods tend to be high in fat, sodium, and added sugar. Shop around the outside edge of the grocery store. This includes fresh fruits and vegetables, bulk grains, fresh meats, and fresh dairy.  Cooking Use low-heat cooking methods, such as baking, instead of high-heat cooking methods like deep frying. Cook using healthy oils, such as olive, canola, or sunflower oil. Avoid cooking with butter, cream, or high-fat meats.  Meal planning Eat meals and snacks regularly, preferably at the same times every day. Avoid going long periods of time without eating. Eat foods high in fiber, such as fresh fruits, vegetables, beans, and whole grains. Talk to your dietitian about how many servings of carbohydrates you can eat at each meal. Eat 4-6 ounces of lean protein each day, such as lean meat, chicken, fish, eggs, or tofu. 1 ounce is equal to 1 ounce of meat, chicken, or fish, 1 egg, or 1/4 cup of tofu. Eat some foods each day that contain healthy fats, such as avocado, nuts, seeds, and fish.  Lifestyle  Check your blood glucose regularly. Exercise at least 30 minutes 5 or more days each week, or as told by your health care provider. Take medicines as told by your health care provider. Do not use any products that contain nicotine or tobacco, such as cigarettes  and e-cigarettes. If you need help quitting, ask your health care provider. Work with a Social worker or diabetes educator to identify strategies to manage stress and any emotional and social challenges.  What are some questions to ask my health care provider? Do I need to meet with a diabetes educator? Do I need to meet with a dietitian? What number can I call if I have questions? When are the best times to check my blood glucose?  Where to find more information: American Diabetes Association: diabetes.org/food-and-fitness/food Academy of Nutrition and Dietetics: PokerClues.dk [employer] of Diabetes and Digestive and Kidney Diseases (NIH): ContactWire.be  Summary A healthy meal plan will help you control your blood glucose and maintain a healthy lifestyle. Working with a diet and nutrition specialist (dietitian) can help you make a meal plan that is best for you. Keep in mind that carbohydrates and alcohol have immediate effects on your blood glucose levels. It is important to count carbohydrates and to use alcohol carefully. This information is not intended to replace advice given to you by your health care provider. Make sure you discuss any questions you have with your health care provider.  Document Released: 12/01/2004 Document Revised: 04/10/2016 Document Reviewed: 04/10/2016 Elsevier Interactive Patient Education  [employer].

## 2021-02-25 NOTE — Progress Notes (Signed)
Subjective:  Patient ID: Maria [name], female    DOB: May 24, 1955, 65 y.o.   MRN: [id number]  Patient Care Team: Baruch Gouty, FNP as PCP - General (Family Medicine)   Chief Complaint:  Diabetes (Follow up, Ozempic)   HPI: Maria [name] is a 65 y.o. female presenting on 02/25/2021 for Diabetes (Follow up, Ozempic)   Patient following up today for management of diabetes.  Goal is to get patient off of insulin.  Last A1C great at 6.3.  She is currently on 38 units of Lantus along with oral medications.  Blood sugars usually run in the 140-160 range.  She denies any hypo or hyperglycemia episodes or symptoms.  She is compliant with her medication regimen.  She does not exercise on a regular basis.  Does try to follow a healthy diet.   Relevant past medical, surgical, family, and social history reviewed and updated as indicated.  Allergies and medications reviewed and updated. Data reviewed: Chart in Epic.   Past Medical History:  Diagnosis Date   Allergy    Asthma    Cancer ([place])    cervical   Complication of anesthesia    " I woke and was short of breath."   CTS (carpal tunnel syndrome)    Diabetes mellitus without complication ([place])    Early cataracts, bilateral    Fracture    trimalleolar ankle left   GERD (gastroesophageal reflux disease)    Glaucoma    Glaucoma    History of kidney stones    " I passed it"   Hyperlipidemia    Hypertension    Hypothyroidism    Neuropathy    Vitamin D deficiency    Wears glasses     Past Surgical History:  Procedure Laterality Date   ABDOMINAL HYSTERECTOMY     APPENDECTOMY     CARDIAC CATHETERIZATION  2006   negative   COLONOSCOPY  2009   negative   INNER EAR SURGERY Right 1981   left shoulder surgery Left 10/2009   ORIF ANKLE FRACTURE Left 09/05/2018   Procedure: OPEN REDUCTION INTERNAL FIXATION TRIMALLEOLAR FRACTURE POSSIBLE SYNDEMOSIS;  Surgeon: Erle Crocker, MD;  Location: [facility];  Service: Orthopedics;   Laterality: Left;   TONSILLECTOMY     TUBAL LIGATION      Social History   Socioeconomic History   Marital status: Married    Spouse name: Elta Guadeloupe   Number of children: 2   Years of education: Not on file   Highest education level: Not on file  Occupational History   Not on file  Tobacco Use   Smoking status: Former    Packs/day: 1.00    Years: 15.00    Pack years: 15.00    Types: Cigarettes    Quit date: 06/05/2001    Years since quitting: 19.7   Smokeless tobacco: Never  Vaping Use   Vaping Use: Never used  Substance and Sexual Activity   Alcohol use: No   Drug use: No   Sexual activity: Not on file  Other Topics Concern   Not on file  Social History Narrative   2 sons.   3 grandchildren.   Social Determinants of Health   Financial Resource Strain: Low Risk    Difficulty of Paying Living Expenses: Not hard at all  Food Insecurity: No Food Insecurity   Worried About Charity fundraiser in the Last Year: Never true   [place] in the Last Year:  Never true  Transportation Needs: No Transportation Needs   Lack of Transportation (Medical): No   Lack of Transportation (Non-Medical): No  Physical Activity: Insufficiently Active   Days of Exercise per Week: 3 days   Minutes of Exercise per Session: 30 min  Stress: No Stress Concern Present   Feeling of Stress : Not at all  Social Connections: Socially Integrated   Frequency of Communication with Friends and Family: More than three times a week   Frequency of Social Gatherings with Friends and Family: More than three times a week   Attends Religious Services: 1 to 4 times per year   Active Member of [employer] or Organizations: Yes   Attends Archivist Meetings: 1 to 4 times per year   Marital Status: Married  Human resources officer Violence: Not At Risk   Fear of Current or Ex-Partner: No   Emotionally Abused: No   Physically Abused: No   Sexually Abused: No    Outpatient Encounter Medications as of  02/25/2021  Medication Sig   acetaminophen (TYLENOL) 500 MG tablet Take 1,000 mg by mouth every 6 (six) hours as needed for moderate pain.   albuterol (PROVENTIL) (2.5 MG/3ML) 0.083% nebulizer solution USE 1 VIAL IN NEBULIZER EVERY 4 HOURS AS NEEDED FOR WHEEZING (Patient taking differently: Take 2.5 mg by nebulization every 4 (four) hours as needed for wheezing or shortness of breath.)   albuterol (VENTOLIN HFA) 108 (90 Base) MCG/ACT inhaler Inhale 2 puffs into the lungs every 6 (six) hours as needed for wheezi ng orshortness of breath.   ALPRAZolam (XANAX) 1 MG tablet TAKE 1 TABLET AT BEDTIME AS NEEDED FOR SLEEP   cetirizine (ZYRTEC) 10 MG tablet 1 tablet   Fluticasone-Umeclidin-Vilant 200-62.5-25 MCG/ACT AEPB INHALE 1 PUFF ONCE DAILY   glipiZIDE (GLUCOTROL XL) 10 MG 24 hr tablet Take 1 tablet (10 mg total) by mouth daily.   ibuprofen (ADVIL) 200 MG tablet Take 400 mg by mouth every 6 (six) hours as needed for moderate pain.   ketoconazole (NIZORAL) 2 % cream Apply bid to rash (Patient taking differently: Apply 1 application topically 2 (two) times daily as needed (rash).)   ketorolac (ACULAR) 0.4 % SOLN Apply to eye.   levothyroxine (SYNTHROID) 50 MCG tablet Take 1 tablet (50 mcg total) by mouth daily.   lisinopril (ZESTRIL) 5 MG tablet TAKE (1) TABLET DAILY FOR HIGH BLOOD PRESSURE.   LUMIGAN 0.01 % SOLN Place 1 drop into both eyes at bedtime.    metFORMIN (GLUCOPHAGE) 500 MG tablet TAKE 2 TABLETS DAILY WITH A MEAL   mometasone (ELOCON) 0.1 % cream Apply 1 application topically daily. (Patient taking differently: Apply 1 application topically daily as needed (rash).)   montelukast (SINGULAIR) 10 MG tablet TAKE 1 TABLET DAILY   omeprazole (PRILOSEC) 40 MG capsule TAKE 1 CAPSULE ONCE DAILY 30 MINUTES BEFORE MEALS   phenylephrine (SUDAFED PE) 10 MG TABS tablet Take 10 mg by mouth every 4 (four) hours as needed (sinus congestion).   rosuvastatin (CRESTOR) 20 MG tablet TAKE 1 TABLET DAILY    triamcinolone cream (KENALOG) 0.1 % Apply 1 application topically 2 (two) times daily as needed (dry skin).   ULTICARE SHORT PEN NEEDLES 31G X 8 MM MISC USE AS DIRECTED   [DISCONTINUED] insulin glargine (LANTUS SOLOSTAR) 100 UNIT/ML Solostar Pen INJECT 42 UNITS SQ EVERY EVENING FOR DIABETES   [DISCONTINUED] ondansetron (ZOFRAN ODT) 4 MG disintegrating tablet Take 1 tablet (4 mg total) by mouth every 8 (eight) hours as needed for nausea  or vomiting. (Patient taking differently: Take 4 mg by mouth every 6 (six) hours as needed for nausea or vomiting.)   [DISCONTINUED] Semaglutide,0.25 or 0.5MG/DOS, (OZEMPIC, 0.25 OR 0.5 MG/DOSE,) 2 MG/1.5ML SOPN See admin instructions.   acyclovir cream (ZOVIRAX) 5 % 1 application   Cholecalciferol (VITAMIN D3) 50 MCG (2000 UT) capsule 1 tablet   insulin glargine (LANTUS SOLOSTAR) 100 UNIT/ML Solostar Pen INJECT 20 UNITS SQ EVERY EVENING FOR DIABETES   Semaglutide,0.25 or 0.5MG/DOS, (OZEMPIC, 0.25 OR 0.5 MG/DOSE,) 2 MG/1.5ML SOPN Inject 0.25 mg into the skin once a week for 4 doses.   [DISCONTINUED] bimatoprost (LUMIGAN) 0.01 % SOLN 1 drop into affected eye in the evening   [DISCONTINUED] levothyroxine (SYNTHROID) 75 MCG tablet TAKE 1 TABLET IN THE MORNING ON AN EMPTY STOMACH   [DISCONTINUED] lisinopril (ZESTRIL) 5 MG tablet Take 1 tablet by mouth daily.   [DISCONTINUED] metFORMIN (GLUCOPHAGE) 500 MG tablet 2 tablets   [DISCONTINUED] montelukast (SINGULAIR) 10 MG tablet 1 tablet   No facility-administered encounter medications on file as of 02/25/2021.    No Known Allergies  Review of Systems  Constitutional:  Negative for activity change, appetite change, chills, fatigue and fever.  HENT: Negative.    Eyes: Negative.   Respiratory:  Negative for cough, chest tightness and shortness of breath.   Cardiovascular:  Negative for chest pain, palpitations and leg swelling.  Gastrointestinal:  Negative for blood in stool, constipation, diarrhea, nausea and vomiting.   Endocrine: Negative.  Negative for polydipsia, polyphagia and polyuria.  Genitourinary:  Negative for decreased urine volume, difficulty urinating, dysuria, frequency and urgency.  Musculoskeletal:  Negative for arthralgias and myalgias.  Skin: Negative.   Allergic/Immunologic: Negative.   Neurological:  Negative for dizziness, weakness and headaches.  Hematological: Negative.   Psychiatric/Behavioral:  Negative for confusion, hallucinations, sleep disturbance and suicidal ideas.   All other systems reviewed and are negative.      Objective:  BP 130/64   Pulse 91   Temp (!) 97.4 F (36.3 C)   Ht _0  (1.575 m)   Wt 153 lb (69.4 kg)   SpO2 94%   BMI 27.98 kg/m    Wt Readings from Last 3 Encounters:  02/25/21 153 lb (69.4 kg)  02/16/21 149 lb (67.6 kg)  01/26/21 149 lb (67.6 kg)    Physical Exam Vitals and nursing note reviewed.  Constitutional:      General: She is not in acute distress.    Appearance: Normal appearance. She is well-developed and well-groomed. She is obese. She is not ill-appearing, toxic-appearing or diaphoretic.  HENT:     Head: Normocephalic and atraumatic.     Jaw: There is normal jaw occlusion.     Right Ear: Hearing normal.     Left Ear: Hearing normal.     Nose: Nose normal.     Mouth/Throat:     Lips: Pink.     Mouth: Mucous membranes are moist.     Pharynx: Oropharynx is clear. Uvula midline.  Eyes:     General: Lids are normal.     Extraocular Movements: Extraocular movements intact.     Conjunctiva/sclera: Conjunctivae normal.     Pupils: Pupils are equal, round, and reactive to light.  Neck:     Thyroid: No thyroid mass, thyromegaly or thyroid tenderness.     Vascular: No carotid bruit or JVD.     Trachea: Trachea and phonation normal.  Cardiovascular:     Rate and Rhythm: Normal rate and regular rhythm.  Chest Wall: PMI is not displaced.     Pulses: Normal pulses.     Heart sounds: Normal heart sounds. No murmur heard.   No  friction rub. No gallop.  Pulmonary:     Effort: Pulmonary effort is normal. No respiratory distress.     Breath sounds: Normal breath sounds. No wheezing.  Abdominal:     General: Bowel sounds are normal. There is no distension or abdominal bruit.     Palpations: Abdomen is soft. There is no hepatomegaly or splenomegaly.     Tenderness: There is no abdominal tenderness. There is no right CVA tenderness or left CVA tenderness.     Hernia: No hernia is present.  Musculoskeletal:        General: Normal range of motion.     Cervical back: Normal range of motion and neck supple.     Right lower leg: No edema.     Left lower leg: No edema.  Lymphadenopathy:     Cervical: No cervical adenopathy.  Skin:    General: Skin is warm and dry.     Capillary Refill: Capillary refill takes less than 2 seconds.     Coloration: Skin is not cyanotic, jaundiced or pale.     Findings: No rash.  Neurological:     General: No focal deficit present.     Mental Status: She is alert and oriented to person, place, and time.     Sensory: Sensation is intact.     Motor: Motor function is intact.     Coordination: Coordination is intact.     Gait: Gait is intact.     Deep Tendon Reflexes: Reflexes are normal and symmetric.  Psychiatric:        Attention and Perception: Attention and perception normal.        Mood and Affect: Mood and affect normal.        Speech: Speech normal.        Behavior: Behavior normal. Behavior is cooperative.        Thought Content: Thought content normal.        Cognition and Memory: Cognition and memory normal.        Judgment: Judgment normal.    Results for orders placed or performed in visit on 01/26/21  CBC with Differential/Platelet  Result Value Ref Range   WBC 6.5 3.4 - 10.8 x10E3/uL   RBC 4.87 3.77 - 5.28 x10E6/uL   Hemoglobin 14.4 11.1 - 15.9 g/dL   Hematocrit 42.3 34.0 - 46.6 %   MCV 87 79 - 97 fL   MCH 29.6 26.6 - 33.0 pg   MCHC 34.0 31.5 - 35.7 g/dL   RDW  12.9 11.7 - 15.4 %   Platelets 237 150 - 450 x10E3/uL   Neutrophils 65 Not Estab. %   Lymphs 21 Not Estab. %   Monocytes 9 Not Estab. %   Eos 4 Not Estab. %   Basos 1 Not Estab. %   Neutrophils Absolute 4.3 1.4 - 7.0 x10E3/uL   Lymphocytes Absolute 1.4 0.7 - 3.1 x10E3/uL   Monocytes Absolute 0.6 0.1 - 0.9 x10E3/uL   EOS (ABSOLUTE) 0.3 0.0 - 0.4 x10E3/uL   Basophils Absolute 0.0 0.0 - 0.2 x10E3/uL   Immature Granulocytes 0 Not Estab. %   Immature Grans (Abs) 0.0 0.0 - 0.1 x10E3/uL  CMP14+EGFR  Result Value Ref Range   Glucose 102 (H) 70 - 99 mg/dL   BUN 11 8 - 27 mg/dL   Creatinine, Ser 0.95  0.57 - 1.00 mg/dL   eGFR 67 >59 mL/min/1.73   BUN/Creatinine Ratio 12 12 - 28   Sodium 140 134 - 144 mmol/L   Potassium 4.3 3.5 - 5.2 mmol/L   Chloride 104 96 - 106 mmol/L   CO2 21 20 - 29 mmol/L   Calcium 9.3 8.7 - 10.3 mg/dL   Total Protein 7.0 6.0 - 8.5 g/dL   Albumin 4.7 3.8 - 4.8 g/dL   Globulin, Total 2.3 1.5 - 4.5 g/dL   Albumin/Globulin Ratio 2.0 1.2 - 2.2   Bilirubin Total 0.7 0.0 - 1.2 mg/dL   Alkaline Phosphatase 131 (H) 44 - 121 IU/L   AST 21 0 - 40 IU/L   ALT 15 0 - 32 IU/L  Microalbumin / creatinine urine ratio  Result Value Ref Range   Creatinine, Urine 107.8 Not Estab. mg/dL   Microalbumin, Urine 68.1 Not Estab. ug/mL   Microalb/Creat Ratio 63 (H) 0 - 29 mg/g creat  Lipid panel  Result Value Ref Range   Cholesterol, Total 116 100 - 199 mg/dL   Triglycerides 183 (H) 0 - 149 mg/dL   HDL 39 (L) >39 mg/dL   VLDL Cholesterol Cal 30 5 - 40 mg/dL   LDL Chol Calc (NIH) 47 0 - 99 mg/dL   Chol/HDL Ratio 3.0 0.0 - 4.4 ratio  Thyroid Panel With TSH  Result Value Ref Range   TSH 0.207 (L) 0.450 - 4.500 uIU/mL   T4, Total 11.4 4.5 - 12.0 ug/dL   T3 Uptake Ratio 30 24 - 39 %   Free Thyroxine Index 3.4 1.2 - 4.9  VITAMIN D 25 Hydroxy (Vit-D Deficiency, Fractures)  Result Value Ref Range   Vit D, 25-Hydroxy 50.0 30.0 - 100.0 ng/mL  Bayer DCA Hb A1c Waived  Result Value Ref  Range   HB A1C (BAYER DCA - WAIVED) 6.3 (H) 4.8 - 5.6 %       Pertinent labs & imaging results that were available during my care of the patient were reviewed by me and considered in my medical decision making.  Assessment & Plan:  Maria [name] was seen today for diabetes.  Diagnoses and all orders for this visit:  Type 2 diabetes mellitus with other specified complication, with long-term current use of insulin [facility]) Patient here today for diabetes medication management.  Goal is to get patient off of Lantus.  Will initiate Ozempic once weekly today, will titrate up after 4 weeks.  Decrease current Lantus dose to 20 units daily.  Monitor blood sugar and report any persistent high or low readings.  After 2 weeks, if blood sugar remains controlled, decrease Lantus to 10 units.  If blood sugar still remains controlled after 2 weeks, will try to discontinue insulin altogether.  Referral placed to clinical pharmacist.  Patient would benefit from Richton device.  Follow-up in 3 months for reevaluation. -     Semaglutide,0.25 or 0.5MG/DOS, (OZEMPIC, 0.25 OR 0.5 MG/DOSE,) 2 MG/1.5ML SOPN; Inject 0.25 mg into the skin once a week for 4 doses. -     insulin glargine (LANTUS SOLOSTAR) 100 UNIT/ML Solostar Pen; INJECT 20 UNITS SQ EVERY EVENING FOR DIABETES -     AMB Referral to [facility]     Continue all other maintenance medications.  Follow up plan: Return in about 3 months (around 05/26/2021), or if symptoms worsen or fail to improve, for DM.   Continue healthy lifestyle choices, including diet (rich in fruits, vegetables, and lean proteins, and low in salt and  simple carbohydrates) and exercise (at least 30 minutes of moderate physical activity daily).  Educational handout given for DM  The above assessment and management plan was discussed with the patient. The patient verbalized understanding of and has agreed to the management plan. Patient is aware to call the clinic if they develop  any new symptoms or if symptoms persist or worsen. Patient is aware when to return to the clinic for a follow-up visit. Patient educated on when it is appropriate to go to the emergency department.   [name], FNP-C [facility] [phone]

## 2021-02-25 NOTE — Telephone Encounter (Signed)
Patient aware and verbalized understanding. Patient states she is too busy right now she will call back to make appointment after the Holidays.

## 2021-02-28 ENCOUNTER — Telehealth: Payer: Self-pay

## 2021-02-28 ENCOUNTER — Telehealth: Payer: Self-pay | Admitting: Family Medicine

## 2021-02-28 NOTE — Telephone Encounter (Signed)
Pt has been informed to start with 0.25 for 4 weeks then if tolerated she may increase to the .5 weekly

## 2021-02-28 NOTE — Chronic Care Management (AMB) (Signed)
  Chronic Care Management   Note  02/28/2021 Name: MAYLIN FREEBURG MRN: [id number] DOB: 06/15/1955  [name] is a 65 y.o. year old female who is a primary care patient of Rakes, Connye Burkitt, FNP. I reached out to [name] by phone today in response to a referral sent by Ms. Jefm Miles Ricciardi's PCP.  Ms. Bushong was given information about Chronic Care Management services today including:  CCM service includes personalized support from designated clinical staff supervised by her physician, including individualized plan of care and coordination with other care providers 24/7 contact phone numbers for assistance for urgent and routine care needs. Service will only be billed when office clinical staff spend 20 minutes or more in a month to coordinate care. Only one practitioner may furnish and bill the service in a calendar month. The patient may stop CCM services at any time (effective at the end of the month) by phone call to the office staff. The patient is responsible for co-pay (up to 20% after annual deductible is met) if co-pay is required by the individual health plan.   Patient agreed to services and verbal consent obtained.   Follow up plan: Telephone appointment with care management team member scheduled for:04/01/2021  Noreene Larsson, [place], [place], [place] [address] Direct Dial: [phone] [email or web address]

## 2021-03-03 NOTE — Progress Notes (Signed)
[facility] Note  03/07/2021     CHIEF COMPLAINT Patient presents for Retina Follow Up    HISTORY OF PRESENT ILLNESS: Maria [name] is a 65 y.o. female who presents to the clinic today for:   HPI     Retina Follow Up   Patient presents with  Diabetic Retinopathy.  In both eyes.  This started 1 year ago.  I, the attending physician,  performed the HPI with the patient and updated documentation appropriately.        Comments   Patient here for 1 year Retina follow up for NPDR OU. Referred by Dr Herbert Deaner. Patient states visin doing good. No eye pain.      Last edited by Bernarda Caffey, MD on 03/07/2021  1:02 PM.     pt is delayed to follow up from 3 months to 12 months, pt saw Dr. Herbert Deaner who told her to come back here, pt states she has not had any problems with her vision in the last year, she states her diabetic medication is being changed, but her last A1c was 6.3  Referring physician: Monna Fam, MD [place], [place] [address]  HISTORICAL INFORMATION:   Selected notes from the MEDICAL RECORD NUMBER Referred by Dr. Herbert Deaner LEE: 01/28/2020 BCVA: 20/20 OU NPDR OU, PCIOL OU, possible CME OS, POAG OU    CURRENT MEDICATIONS: Current Outpatient Medications (Ophthalmic Drugs)  Medication Sig   LUMIGAN 0.01 % SOLN Place 1 drop into both eyes at bedtime.    ketorolac (ACULAR) 0.4 % SOLN Apply to eye. (Patient not taking: Reported on 03/07/2021)   No current facility-administered medications for this visit. (Ophthalmic Drugs)   Current Outpatient Medications (Other)  Medication Sig   acetaminophen (TYLENOL) 500 MG tablet Take 1,000 mg by mouth every 6 (six) hours as needed for moderate pain.   acyclovir cream (ZOVIRAX) 5 % 1 application   albuterol (PROVENTIL) (2.5 MG/3ML) 0.083% nebulizer solution USE 1 VIAL IN NEBULIZER EVERY 4 HOURS AS NEEDED FOR WHEEZING (Patient taking differently: Take 2.5 mg by nebulization every 4  (four) hours as needed for wheezing or shortness of breath.)   albuterol (VENTOLIN HFA) 108 (90 Base) MCG/ACT inhaler Inhale 2 puffs into the lungs every 6 (six) hours as needed for wheezi ng orshortness of breath.   cetirizine (ZYRTEC) 10 MG tablet 1 tablet   Cholecalciferol (VITAMIN D3) 50 MCG (2000 UT) capsule 1 tablet   Fluticasone-Umeclidin-Vilant 200-62.5-25 MCG/ACT AEPB INHALE 1 PUFF ONCE DAILY   glipiZIDE (GLUCOTROL XL) 10 MG 24 hr tablet Take 1 tablet (10 mg total) by mouth daily.   ibuprofen (ADVIL) 200 MG tablet Take 400 mg by mouth every 6 (six) hours as needed for moderate pain.   insulin glargine (LANTUS SOLOSTAR) 100 UNIT/ML Solostar Pen INJECT 20 UNITS SQ EVERY EVENING FOR DIABETES   levothyroxine (SYNTHROID) 50 MCG tablet Take 1 tablet (50 mcg total) by mouth daily.   lisinopril (ZESTRIL) 5 MG tablet TAKE (1) TABLET DAILY FOR HIGH BLOOD PRESSURE.   metFORMIN (GLUCOPHAGE) 500 MG tablet TAKE 2 TABLETS DAILY WITH A MEAL   mometasone (ELOCON) 0.1 % cream Apply 1 application topically daily. (Patient taking differently: Apply 1 application topically daily as needed (rash).)   montelukast (SINGULAIR) 10 MG tablet TAKE 1 TABLET DAILY   omeprazole (PRILOSEC) 40 MG capsule TAKE 1 CAPSULE ONCE DAILY 30 MINUTES BEFORE MEALS   phenylephrine (SUDAFED PE) 10 MG TABS tablet Take 10 mg by mouth  every 4 (four) hours as needed (sinus congestion).   rosuvastatin (CRESTOR) 20 MG tablet TAKE 1 TABLET DAILY   Semaglutide,0.25 or 0.5MG /DOS, (OZEMPIC, 0.25 OR 0.5 MG/DOSE,) 2 MG/1.5ML SOPN Inject 0.25 mg into the skin once a week for 4 doses.   triamcinolone cream (KENALOG) 0.1 % Apply 1 application topically 2 (two) times daily as needed (dry skin).   ULTICARE SHORT PEN NEEDLES 31G X 8 MM MISC USE AS DIRECTED   ALPRAZolam (XANAX) 1 MG tablet TAKE 1 TABLET AT BEDTIME AS NEEDED FOR SLEEP (Patient not taking: Reported on 03/07/2021)   ketoconazole (NIZORAL) 2 % cream Apply bid to rash (Patient not taking:  Reported on 03/07/2021)   No current facility-administered medications for this visit. (Other)   REVIEW OF SYSTEMS: ROS   Positive for: Endocrine, Eyes, Psychiatric Negative for: Constitutional, Gastrointestinal, Neurological, Skin, Genitourinary, Musculoskeletal, HENT, Cardiovascular, Respiratory, Allergic/Imm, Heme/Lymph Last edited by Theodore Demark, COA on 03/07/2021  8:49 AM.     ALLERGIES No Known Allergies  PAST MEDICAL HISTORY Past Medical History:  Diagnosis Date   Allergy    Asthma    Cancer ([place])    cervical   Complication of anesthesia    " I woke and was short of breath."   CTS (carpal tunnel syndrome)    Diabetes mellitus without complication ([place])    Early cataracts, bilateral    Fracture    trimalleolar ankle left   GERD (gastroesophageal reflux disease)    Glaucoma    Glaucoma    History of kidney stones    " I passed it"   Hyperlipidemia    Hypertension    Hypothyroidism    Neuropathy    Vitamin D deficiency    Wears glasses    Past Surgical History:  Procedure Laterality Date   ABDOMINAL HYSTERECTOMY     APPENDECTOMY     CARDIAC CATHETERIZATION  2006   negative   COLONOSCOPY  2009   negative   INNER EAR SURGERY Right 1981   left shoulder surgery Left 10/2009   ORIF ANKLE FRACTURE Left 09/05/2018   Procedure: OPEN REDUCTION INTERNAL FIXATION TRIMALLEOLAR FRACTURE POSSIBLE SYNDEMOSIS;  Surgeon: Erle Crocker, MD;  Location: [facility];  Service: Orthopedics;  Laterality: Left;   TONSILLECTOMY     TUBAL LIGATION      FAMILY HISTORY Family History  Problem Relation Age of Onset   Diabetes Mother    Prostate cancer Father    Heart disease Father    Heart attack Other     SOCIAL HISTORY Social History   Tobacco Use   Smoking status: Former    Packs/day: 1.00    Years: 15.00    Pack years: 15.00    Types: Cigarettes    Quit date: 06/05/2001    Years since quitting: 19.7   Smokeless tobacco: Never  Vaping Use   Vaping Use:  Never used  Substance Use Topics   Alcohol use: No   Drug use: No       OPHTHALMIC EXAM:  Base Eye Exam     Visual Acuity (Snellen - Linear)       Right Left   Dist cc 20/25 20/25   Dist ph cc NI NI    Correction: Glasses         Tonometry (Tonopen, 8:47 AM)       Right Left   Pressure 13 12         Pupils       Dark Light  Shape React APD   Right 3 2 Round Brisk None   Left 3 2 Round Brisk None         Visual Fields (Counting fingers)       Left Right    Full Full         Extraocular Movement       Right Left    Full, Ortho Full, Ortho         Neuro/Psych     Oriented x3: Yes   Mood/Affect: Normal         Dilation     Both eyes: 1.0% Mydriacyl, 2.5% Phenylephrine @ 8:47 AM           Slit Lamp and Fundus Exam     Slit Lamp Exam       Right Left   Lids/Lashes Dermatochalasis - upper lid, Meibomian gland dysfunction, Telangiectasia Dermatochalasis - upper lid, Meibomian gland dysfunction, Telangiectasia   Conjunctiva/Sclera White and quiet White and quiet   Cornea 1-2+ Punctate epithelial erosions, arcus, well healed temporal cataract wounds, tear film debris 2+ inferior Punctate epithelial erosions, arcus, well healed temporal cataract wounds   Anterior Chamber Deep and quiet Deep and quiet   Iris Round and dilated, No NVI Round and dilated, No NVI   Lens PC IOL in good position, 1+ Posterior capsular opacification PC IOL in good position, trace Posterior capsular opacification   Anterior Vitreous mild Vitreous syneresis Vitreous syneresis         Fundus Exam       Right Left   Disc Mild Pallor, Sharp rim Mild Pallor, Sharp rim   C/D Ratio 0.6 0.5   Macula Flat, Blunted foveal reflex, mild RPE mottling and clumping, perifoveal MA's with focal cystic changes nasal fovea Flat, Blunted foveal reflex, mild RPE mottling and clumping, scattered MA/DBH, focal cluster SN to fovea   Vessels attenuated, Tortuous attenuated, mild  tortuousity   Periphery Attached, scattered MA greatest temporal periphery Attached, scattered MA/DBH           Refraction     Wearing Rx       Sphere Cylinder Axis Add   Right -0.25 +1.00 016 +2.75   Left -1.00 Sphere  +2.75            IMAGING AND PROCEDURES  Imaging and Procedures for 03/07/2021  OCT, Retina - OU - Both Eyes       Right Eye Quality was good. Central Foveal Thickness: 334. Progression has worsened. Findings include abnormal foveal contour, no SRF, vitreomacular adhesion , intraretinal hyper-reflective material, intraretinal fluid (Mild, focal IRF/IRHM nasal macula and fovea).   Left Eye Quality was good. Central Foveal Thickness: 310. Progression has been stable. Findings include no SRF, no IRF, abnormal foveal contour (Trace IRHM).   Notes *Images captured and stored on drive  Diagnosis / Impression:  OD: Mild, focal IRF/IRHM nasal macula and fovea OS: Trace IRHM  Clinical management:  See below  Abbreviations: NFP - Normal foveal profile. CME - cystoid macular edema. PED - pigment epithelial detachment. IRF - intraretinal fluid. SRF - subretinal fluid. EZ - ellipsoid zone. ERM - epiretinal membrane. ORA - outer retinal atrophy. ORT - outer retinal tubulation. SRHM - subretinal hyper-reflective material. IRHM - intraretinal hyper-reflective material               ASSESSMENT/PLAN:    ICD-10-CM   1. Moderate nonproliferative diabetic retinopathy of right eye with macular edema associated with type 2 diabetes mellitus ([place])  I62.7035 OCT, Retina - OU - Both Eyes    2. Moderate nonproliferative diabetic retinopathy of left eye without macular edema associated with type 2 diabetes mellitus ([facility])  K09.3818 OCT, Retina - OU - Both Eyes    3. Essential hypertension  I10     4. Hypertensive retinopathy of both eyes  H35.033     5. Pseudophakia, both eyes  Z96.1     6. Dry eyes  H04.123       1,2.Moderate nonproliferative diabetic  retinopathy, OU  - OD with mild interval increase in cystic changes nasal fovea -- +DME  - OS without frank DME -- just tr cystic changes  - pt delayed to follow up from 3 months to 12 months (12.08.21-12.19.22) - FA 12.8.21 shows leaking MA, no NV OU  - BCVA today 20/25 OU from 20/20 OU - exam w/ scattered MA/DBH OU - OCT shows: OD: Mild increase in IRF/IRHM nasal macula and fovea; OS: Trace IRHM - discussed findings, prognosis, potential treatment options and importance of f/u - no retinal or ophthalmic interventions indicated or recommended  today - f/u in 3 mos -- DFE/OCT  3,4. Hypertensive retinopathy OU - discussed importance of tight BP control - monitor  5. Pseudophakia OU  - s/p CE/IOL (Dr. Herbert Deaner; OS: 08.21, OD: 10.21  - IOL in good position, doing well  - monitor  6. Dry eyes OU - recommend artificial tears and lubricating ointment as needed   Ophthalmic Meds Ordered this visit:  No orders of the defined types were placed in this encounter.    Return in about 3 months (around 06/05/2021) for f/u NPDR OU, DFE, OCT.  There are no Patient Instructions on file for this visit.   Explained the diagnoses, plan, and follow up with the patient and they expressed understanding.  Patient expressed understanding of the importance of proper follow up care.   This document serves as a record of services personally performed by Gardiner Sleeper, MD, PhD. It was created on their behalf by Roselee Nova, COMT. The creation of this record is the provider's dictation and/or activities during the visit.  Electronically signed by: Roselee Nova, COMT 03/07/21 1:08 PM  This document serves as a record of services personally performed by Gardiner Sleeper, MD, PhD. It was created on their behalf by San Jetty. Owens Shark, OA an ophthalmic technician. The creation of this record is the provider's dictation and/or activities during the visit.    Electronically signed by: San Jetty. Owens Shark, [place] 12.19.2022  1:08 PM  Gardiner Sleeper, M.D., Ph.D. Diseases & Surgery of the Retina and Vitreous [facility]  I have reviewed the above documentation for accuracy and completeness, and I agree with the above. Gardiner Sleeper, M.D., Ph.D. 03/07/21 1:08 PM   Abbreviations: M myopia (nearsighted); A astigmatism; H hyperopia (farsighted); P presbyopia; Mrx spectacle prescription;  CTL contact lenses; OD right eye; OS left eye; OU both eyes  XT exotropia; ET esotropia; PEK punctate epithelial keratitis; PEE punctate epithelial erosions; DES dry eye syndrome; MGD meibomian gland dysfunction; ATs artificial tears; PFAT's preservative free artificial tears; [place] nuclear sclerotic cataract; PSC posterior subcapsular cataract; ERM epi-retinal membrane; PVD posterior vitreous detachment; RD retinal detachment; DM diabetes mellitus; DR diabetic retinopathy; NPDR non-proliferative diabetic retinopathy; PDR proliferative diabetic retinopathy; CSME clinically significant macular edema; DME diabetic macular edema; dbh dot blot hemorrhages; CWS cotton wool spot; POAG primary open angle glaucoma; C/D cup-to-disc ratio; HVF humphrey visual field; GVF goldmann  visual field; OCT optical coherence tomography; IOP intraocular pressure; BRVO Branch retinal vein occlusion; CRVO central retinal vein occlusion; CRAO central retinal artery occlusion; BRAO branch retinal artery occlusion; RT retinal tear; SB scleral buckle; PPV pars plana vitrectomy; VH Vitreous hemorrhage; PRP panretinal laser photocoagulation; IVK intravitreal kenalog; VMT vitreomacular traction; MH Macular hole;  NVD neovascularization of the disc; NVE neovascularization elsewhere; AREDS age related eye disease study; ARMD age related macular degeneration; POAG primary open angle glaucoma; EBMD epithelial/anterior basement membrane dystrophy; ACIOL anterior chamber intraocular lens; IOL intraocular lens; PCIOL posterior chamber intraocular lens; Phaco/IOL  phacoemulsification with intraocular lens placement; [place] photorefractive keratectomy; LASIK laser assisted in situ keratomileusis; HTN hypertension; DM diabetes mellitus; COPD chronic obstructive pulmonary disease

## 2021-03-07 ENCOUNTER — Ambulatory Visit (INDEPENDENT_AMBULATORY_CARE_PROVIDER_SITE_OTHER): Payer: Medicare Other | Admitting: Ophthalmology

## 2021-03-07 ENCOUNTER — Other Ambulatory Visit: Payer: Self-pay

## 2021-03-07 ENCOUNTER — Encounter (INDEPENDENT_AMBULATORY_CARE_PROVIDER_SITE_OTHER): Payer: Self-pay | Admitting: Ophthalmology

## 2021-03-07 DIAGNOSIS — Z961 Presence of intraocular lens: Secondary | ICD-10-CM

## 2021-03-07 DIAGNOSIS — H04123 Dry eye syndrome of bilateral lacrimal glands: Secondary | ICD-10-CM

## 2021-03-07 DIAGNOSIS — I10 Essential (primary) hypertension: Secondary | ICD-10-CM | POA: Diagnosis not present

## 2021-03-07 DIAGNOSIS — H35033 Hypertensive retinopathy, bilateral: Secondary | ICD-10-CM

## 2021-03-07 DIAGNOSIS — E113311 Type 2 diabetes mellitus with moderate nonproliferative diabetic retinopathy with macular edema, right eye: Secondary | ICD-10-CM

## 2021-03-07 DIAGNOSIS — E113392 Type 2 diabetes mellitus with moderate nonproliferative diabetic retinopathy without macular edema, left eye: Secondary | ICD-10-CM | POA: Diagnosis not present

## 2021-03-23 ENCOUNTER — Telehealth: Payer: Self-pay | Admitting: Family Medicine

## 2021-03-23 NOTE — Telephone Encounter (Signed)
Pt called stating that she has been taking Ozempic 0.25mg  for 4 wks and has had a lot of nausea and vomitting for about a wk. Needs advise on what to do.

## 2021-03-24 NOTE — Telephone Encounter (Signed)
[name], FNP advised stop ozempic, continue glipizide, metformin.  Schedule follow up to discuss options.  Contacted patient and advised. Appointment scheduled 03/30/2021 at 10:50am

## 2021-03-24 NOTE — Telephone Encounter (Signed)
Spoke with pt and daughter and pt says she is barely able to eat anything and says she is trying to stay hydrated.  Daughter says she was previously put on Ozempic by Dr Lajoyce Corners at [facility] in [place], and says pt had the same symptoms and she is now when she was on it.  Daughter says pt needs to come off the medicine and wants to know if there is anything else pt can take?

## 2021-03-30 ENCOUNTER — Other Ambulatory Visit: Payer: Self-pay | Admitting: Family Medicine

## 2021-03-30 ENCOUNTER — Ambulatory Visit (INDEPENDENT_AMBULATORY_CARE_PROVIDER_SITE_OTHER): Payer: Medicare Other | Admitting: Pharmacist

## 2021-03-30 ENCOUNTER — Encounter: Payer: Self-pay | Admitting: Family Medicine

## 2021-03-30 ENCOUNTER — Ambulatory Visit (INDEPENDENT_AMBULATORY_CARE_PROVIDER_SITE_OTHER): Payer: Medicare Other | Admitting: Family Medicine

## 2021-03-30 DIAGNOSIS — Z794 Long term (current) use of insulin: Secondary | ICD-10-CM

## 2021-03-30 DIAGNOSIS — E1169 Type 2 diabetes mellitus with other specified complication: Secondary | ICD-10-CM

## 2021-03-30 DIAGNOSIS — F5101 Primary insomnia: Secondary | ICD-10-CM

## 2021-03-30 DIAGNOSIS — E785 Hyperlipidemia, unspecified: Secondary | ICD-10-CM

## 2021-03-30 MED ORDER — ONDANSETRON 4 MG PO TBDP: 4.0000 mg | ORAL_TABLET | Freq: Three times a day (TID) | ORAL | 0 refills | Status: DC | PRN

## 2021-03-30 MED ORDER — HYDROXYZINE PAMOATE 25 MG PO CAPS: 25.0000 mg | ORAL_CAPSULE | Freq: Three times a day (TID) | ORAL | 0 refills | Status: DC | PRN

## 2021-03-30 MED ORDER — PIOGLITAZONE HCL 15 MG PO TABS: 15.0000 mg | ORAL_TABLET | Freq: Every day | ORAL | 4 refills | Status: DC

## 2021-03-30 MED ORDER — HYDROXYZINE PAMOATE 25 MG PO CAPS: 25.0000 mg | ORAL_CAPSULE | Freq: Every evening | ORAL | 0 refills | Status: DC | PRN

## 2021-03-30 NOTE — Progress Notes (Signed)
Chronic Care Management Pharmacy Note  03/30/2021 Name:  Maria [name] MRN:  [id number] DOB:  [birth year]  Summary: T2DM  Recommendations/Changes made from today's visit:  Diabetes: New goal. Uncontrolled-A1C IS 6.3% in 01/2021, but blood sugar continues to rise; current treatment: GLIPIZIDE XL $RemoveBefo'10MG'nZUBXXvnQDu$  DAILY, METFORMIN (1 gram daily-GFR 63);  Patient reports she is feeling tired/weak on ozempic 44-month trial.  This is her 2nd time trying a GLP1.  She would like to discontinue it Lantus last dose 03/25/21, last dose ozempic was 03/21/21 Current glucose readings:  FBG 177, 140, 129, 125 109 164 196 124 180 192 PM after dinner  383, 154 239 202 267 237 Denies hypoglycemic/hyperglycemic symptoms Discussed meal planning options and Plate method for healthy eating Avoid sugary drinks and desserts Incorporate balanced protein, non starchy veggies, 1 serving of carbohydrate with each meal Increase water intake Increase physical activity as able Current exercise: N/A Blood pressure and cholesterol controlled-continue Recommended GLP1 (RYBELSUS) OR SGLT2--PATIENT DECLINED; patient/daughter-in-law requested pioglitazone Will call in [place] 2 CGM to see if it is covered  Patient Goals/Self-Care Activities patient will:  - take medications as prescribed as evidenced by patient report and record review check glucose daily, document, and provide at future appointments engage in dietary modifications by FOLLOWING A HEART HEALTHY DIET/HEALTHY PLATE METHOD  Plan: F/U 2 MONTHS  Subjective: Maria [name] is an 66 y.o. year old female who is a primary patient of Rakes, Connye Burkitt, FNP.  The CCM team was consulted for assistance with disease management and care coordination needs.    Engaged with patient face to face for initial visit in response to provider referral for pharmacy case management and/or care coordination services.   Consent to Services:  The patient was given information about Chronic  Care Management services, agreed to services, and gave verbal consent prior to initiation of services.  Please see initial visit note for detailed documentation.   Patient Care Team: Baruch Gouty, FNP as PCP - General (Family Medicine) Lavera Guise, [facility] as Pharmacist (Family Medicine)  Objective:  Lab Results  Component Value Date   CREATININE 0.95 01/26/2021   CREATININE 0.71 02/03/2019   CREATININE 0.93 09/04/2018    Lab Results  Component Value Date   HGBA1C 6.3 (H) 01/26/2021   Last diabetic Eye exam:  Lab Results  Component Value Date/Time   HMDIABEYEEXA Retinopathy (A) 01/28/2020 04:22 PM    Last diabetic Foot exam: No results found for: HMDIABFOOTEX      Component Value Date/Time   CHOL 116 01/26/2021 1509   TRIG 183 (H) 01/26/2021 1509   HDL 39 (L) 01/26/2021 1509   CHOLHDL 3.0 01/26/2021 1509   CHOLHDL 4.8 01/07/2014 0737   VLDL 19 01/07/2014 0737   LDLCALC 47 01/26/2021 1509    Hepatic Function Latest Ref Rng & Units 01/26/2021 08/05/2019 02/03/2019  Total Protein 6.0 - 8.5 g/dL 7.0 6.5 6.7  Albumin 3.8 - 4.8 g/dL 4.7 4.1 4.3  AST 0 - 40 IU/L $Remov'21 19 17  'vxRgii$ ALT 0 - 32 IU/L $Remov'15 21 15  'cTmtyW$ Alk Phosphatase 44 - 121 IU/L 131(H) 153(H) 134(H)  Total Bilirubin 0.0 - 1.2 mg/dL 0.7 0.7 0.5  Bilirubin, Direct 0.00 - 0.40 mg/dL - 0.22 0.15    Lab Results  Component Value Date/Time   TSH 0.207 (L) 01/26/2021 03:09 PM   TSH 0.632 02/03/2019 08:06 AM    CBC Latest Ref Rng & Units 01/26/2021 09/04/2018  WBC 3.4 - 10.8 x10E3/uL 6.5 6.7  Hemoglobin 11.1 - 15.9 g/dL 14.4 13.1  Hematocrit 34.0 - 46.6 % 42.3 39.0  Platelets 150 - 450 x10E3/uL 237 305    Lab Results  Component Value Date/Time   VD25OH 50.0 01/26/2021 03:09 PM   VD25OH 34.6 01/13/2016 08:04 AM    Clinical ASCVD: No  The ASCVD Risk score (Arnett DK, et al., 2019) failed to calculate for the following reasons:   The valid total cholesterol range is 130 to 320 mg/dL    Other: (CHADS2VASc if Afib, PHQ9  if depression, MMRC or CAT for COPD, ACT, DEXA)  Social History   Tobacco Use  Smoking Status Former   Packs/day: 1.00   Years: 15.00   Pack years: 15.00   Types: Cigarettes   Quit date: 06/05/2001   Years since quitting: 19.8  Smokeless Tobacco Never   BP Readings from Last 3 Encounters:  02/25/21 130/64  01/26/21 109/65  09/05/18 (!) 114/57   Pulse Readings from Last 3 Encounters:  02/25/21 91  01/26/21 100  09/05/18 (!) 106   Wt Readings from Last 3 Encounters:  02/25/21 153 lb (69.4 kg)  02/16/21 149 lb (67.6 kg)  01/26/21 149 lb (67.6 kg)    Assessment: Review of patient past medical history, allergies, medications, health status, including review of consultants reports, laboratory and other test data, was performed as part of comprehensive evaluation and provision of chronic care management services.   SDOH:  (Social Determinants of Health) assessments and interventions performed:    CCM Care Plan  Allergies  Allergen Reactions   Ozempic (0.25 Or 0.5 Mg-Dose) [Semaglutide(0.25 Or 0.5mg -Dos)] Nausea And Vomiting    N/v, no energy    Medications Reviewed Today     Reviewed by Lavera Guise, [facility] (Pharmacist) on 04/05/21 at [phone]  Med List Status: <None>   Medication Order Taking? Sig Documenting Provider Last Dose Status Informant  acetaminophen (TYLENOL) 500 MG tablet [id number] No Take 1,000 mg by mouth every 6 (six) hours as needed for moderate pain. [provider] Taking Active Self  acyclovir cream (ZOVIRAX) 5 % [id number] No 1 application [provider] Taking Active   albuterol (PROVENTIL) (2.5 MG/3ML) 0.083% nebulizer solution [id number] No USE 1 VIAL IN NEBULIZER EVERY 4 HOURS AS NEEDED FOR WHEEZING  Patient taking differently: Take 2.5 mg by nebulization every 4 (four) hours as needed for wheezing or shortness of breath.   Mikey Kirschner, MD Taking Active Self  albuterol (VENTOLIN HFA) 108 (90 Base) MCG/ACT inhaler [id number] No  Inhale 2 puffs into the lungs every 6 (six) hours as needed for wheezi ng orshortness of breath. Mikey Kirschner, MD Taking Active   cetirizine (ZYRTEC) 10 MG tablet [id number] No 1 tablet [provider] Taking Active   Cholecalciferol (VITAMIN D3) 50 MCG (2000 UT) capsule [id number] No 1 tablet [provider] Taking Active   Continuous Blood Gluc Receiver (FREESTYLE LIBRE 2 READER) [place] [id number]  Use to test blood sugar continuously. DX: E11.65 Baruch Gouty, FNP  Active   Continuous Blood Gluc Sensor (FREESTYLE LIBRE 2 SENSOR) [place] [id number]  Use to test blood sugar continuously. DX: E11.65 Baruch Gouty, FNP  Active   Fluticasone-Umeclidin-Vilant 200-62.5-25 MCG/ACT AEPB [id number] No INHALE 1 PUFF ONCE DAILY [provider] Taking Active   glipiZIDE (GLUCOTROL XL) 10 MG 24 hr tablet [id number] No Take 1 tablet (10 mg total) by mouth daily. Elvia Collum M, DO Taking Active   hydrOXYzine (VISTARIL) 25 MG capsule [id number]  Take 1 capsule (  25 mg total) by mouth at bedtime as needed and may repeat dose one time if needed. Baruch Gouty, FNP  Active   ibuprofen (ADVIL) 200 MG tablet [id number] No Take 400 mg by mouth every 6 (six) hours as needed for moderate pain. [provider] Taking Active Self  ketoconazole (NIZORAL) 2 % cream [id number] No Apply bid to rash Mikey Kirschner, MD Taking Active Self  ketorolac (ACULAR) 0.4 % SOLN [id number] No Apply to eye. [provider] Taking Active   levothyroxine (SYNTHROID) 50 MCG tablet [id number] No Take 1 tablet (50 mcg total) by mouth daily. Baruch Gouty, FNP Taking Active   lisinopril (ZESTRIL) 5 MG tablet [id number] No TAKE (1) TABLET DAILY FOR HIGH BLOOD PRESSURE. Lovena Le, Malena M, DO Taking Active   LUMIGAN 0.01 % SOLN [id number] No Place 1 drop into both eyes at bedtime.  [provider] Taking Active Self  metFORMIN (GLUCOPHAGE) 500 MG tablet [id number] No TAKE 2 TABLETS DAILY WITH A MEAL  Taylor, Malena M, DO Taking Active   mometasone (ELOCON) 0.1 % cream [id number] No Apply 1 application topically daily.  Patient taking differently: Apply 1 application topically daily as needed (rash).   Mikey Kirschner, MD Taking Active Self  montelukast (SINGULAIR) 10 MG tablet [id number] No TAKE 1 TABLET DAILY Rakes, Connye Burkitt, FNP Taking Active   omeprazole (PRILOSEC) 40 MG capsule [id number] No TAKE 1 CAPSULE ONCE DAILY 30 MINUTES BEFORE MEALS Rakes, Connye Burkitt, FNP Taking Active   ondansetron (ZOFRAN-ODT) 4 MG disintegrating tablet [id number] Yes Take 1 tablet (4 mg total) by mouth every 8 (eight) hours as needed for nausea or vomiting. Baruch Gouty, FNP Taking Active   phenylephrine (SUDAFED PE) 10 MG TABS tablet [id number] No Take 10 mg by mouth every 4 (four) hours as needed (sinus congestion). [provider] Taking Active Self  pioglitazone (ACTOS) 15 MG tablet [id number] Yes Take 1 tablet (15 mg total) by mouth daily. Baruch Gouty, FNP Taking Active   rosuvastatin (CRESTOR) 20 MG tablet [id number] No TAKE 1 TABLET DAILY Rakes, Connye Burkitt, FNP Taking Active   triamcinolone cream (KENALOG) 0.1 % [id number] No Apply 1 application topically 2 (two) times daily as needed (dry skin). Erven Colla, DO Taking Active   ULTICARE SHORT PEN NEEDLES 31G X 8 MM MISC [id number] No USE AS DIRECTED Mikey Kirschner, MD Taking Active             Patient Active Problem List   Diagnosis Date Noted   Moderate nonproliferative diabetic retinopathy of both eyes without macular edema associated with type 2 diabetes mellitus ([place]) 01/26/2021   Hypertension associated with diabetes ([place]) 01/26/2021   Hyperlipidemia associated with type 2 diabetes mellitus ([place]) 01/26/2021   Diabetic neuropathy associated with type 2 diabetes mellitus ([place]) 01/26/2021   Anxiety and depression 07/19/2017   Diabetic gastroparesis associated with type 2 diabetes mellitus ([place]) 04/19/2016   Insomnia 07/14/2015    Encounter for long-term opiate analgesic use 10/02/2014   Vitamin D deficiency 06/29/2014   GERD (gastroesophageal reflux disease) 03/24/2013   Type 2 diabetes mellitus with other specified complication ([place]) 68/34/1962   Hypothyroidism 06/06/2012   Asthma, chronic 06/06/2012   Glaucoma 06/06/2012    Immunization History  Administered Date(s) Administered   Influenza Split 12/12/2012   Influenza,inj,Quad PF,6+ Mos 01/07/2014, 01/04/2015, 01/13/2016, 12/03/2017, 12/14/2018, 03/02/2020   Influenza-Unspecified 12/18/2020   Pneumococcal-Unspecified 01/12/2009, 12/18/2020   Tdap 06/25/2013    Conditions to be addressed/monitored: HTN, HLD,  and DMII  Care Plan : PHARMD MEDICATION MANAGEMENT  Updates made by Lavera Guise, RPH since 04/05/2021 12:00 AM     Problem: DISEASE PROGRESSION PREVENTION      Long-Range Goal: T2DM PHARMD GOALS   This Visit's Progress: Not on track  Priority: High  Note:   Current Barriers:  Unable to achieve control of T2DM  Suboptimal therapeutic regimen for T2DM  Pharmacist Clinical Goal(s):  patient will achieve control of T2DM as evidenced by A1C<7%, IMPROVED GLYCEMIC CONTROL maintain control of T2DM as evidenced by A1C<7%, IMPROVED GLYCEMIC CONTROL  through collaboration with PharmD and provider.    Interventions: 1:1 collaboration with Rakes, Connye Burkitt, FNP regarding development and update of comprehensive plan of care as evidenced by provider attestation and co-signature Inter-disciplinary care team collaboration (see longitudinal plan of care) Comprehensive medication review performed; medication list updated in electronic medical record  Diabetes: New goal. Uncontrolled-A1C IS 6.3% in 01/2021, but blood sugar continues to rise; current treatment: GLIPIZIDE XL $RemoveBefo'10MG'ebjzcwJlmZa$  DAILY, METFORMIN (1 gram daily-GFR 63);  Patient reports she is feeling tired/weak on ozempic 43-month trial.  This is her 2nd time trying a GLP1.  She would like to discontinue  it Lantus last dose 03/25/21, last dose ozempic was 03/21/21 Current glucose readings:  FBG 177, 140, 129, 125 109 164 196 124 180 192 PM after dinner  383, 154 239 202 267 237 Denies hypoglycemic/hyperglycemic symptoms Discussed meal planning options and Plate method for healthy eating Avoid sugary drinks and desserts Incorporate balanced protein, non starchy veggies, 1 serving of carbohydrate with each meal Increase water intake Increase physical activity as able Current exercise: N/A Blood pressure and cholesterol controlled-continue Recommended GLP1 (RYBELSUS) OR SGLT2 --PATIENT DECLINED; patient/daughter-in-law requested pioglitazone Will call in [place] 2 CGM to see if it is covered   Patient Goals/Self-Care Activities patient will:  - take medications as prescribed as evidenced by patient report and record review check glucose daily, document, and provide at future appointments engage in dietary modifications by   FOLLOWING A HEART HEALTHY DIET/HEALTHY PLATE METHOD       Medication Assistance: None required.  Patient affirms current coverage meets needs.  Patient's preferred pharmacy is:  [facility] [address] [place] [address] Phone: [phone] Fax: [phone]  [facility] ([place], [facility] [facility] [address] [place] [address] [place] [address] Phone: [phone] Fax: [phone]   Follow Up:  Patient agrees to Care Plan and Follow-up.  Plan: Telephone follow up appointment with care management team member scheduled for:  2 MONTHS   Regina Eck, PharmD, BCPS Clinical Pharmacist, [place]  II Phone [phone]

## 2021-03-30 NOTE — Progress Notes (Signed)
Has tried and failed trazodone for insomnia. Would like refill on Xanax, pt aware this is not indicated for sleep. Will trial Vistaril with melatonin.

## 2021-03-30 NOTE — Progress Notes (Signed)
Rescheduled, did not see, no charge

## 2021-03-31 ENCOUNTER — Telehealth: Payer: Self-pay | Admitting: Family Medicine

## 2021-03-31 DIAGNOSIS — E1169 Type 2 diabetes mellitus with other specified complication: Secondary | ICD-10-CM

## 2021-03-31 NOTE — Telephone Encounter (Signed)
Pt's daughter states both of their phones and her husbands phones are "not compatible" with the app for the [place]. Should we send an rx for the reader to the pharmacy or?

## 2021-04-01 ENCOUNTER — Telehealth: Payer: Self-pay | Admitting: Family Medicine

## 2021-04-01 ENCOUNTER — Ambulatory Visit: Payer: Medicare Other

## 2021-04-01 MED ORDER — FREESTYLE LIBRE 2 READER DEVI: 0 refills | Status: DC

## 2021-04-01 MED ORDER — FREESTYLE LIBRE 2 SENSOR MISC: 11 refills | Status: DC

## 2021-04-01 MED ORDER — FREESTYLE LIBRE 2 READER DEVI: 0 refills | Status: AC

## 2021-04-01 NOTE — Telephone Encounter (Signed)
Pt's DIL has questions about Libre. Please call back.

## 2021-04-01 NOTE — Telephone Encounter (Signed)
Prescriptions sent to pharmacy to see if they cover both It appears [place] will allow [place] to send it pharmacy this year

## 2021-04-01 NOTE — Patient Instructions (Addendum)
Visit Information  Thank you for taking time to visit with me today. Please don't hesitate to contact me if I can be of assistance to you before our next scheduled telephone appointment.  Following are the goals we discussed today:  Current Barriers:  Unable to achieve control of T2DM  Suboptimal therapeutic regimen for T2DM  Pharmacist Clinical Goal(s):  patient will achieve control of T2DM as evidenced by A1C<7%, IMPROVED GLYCEMIC CONTROL maintain control of T2DM as evidenced by A1C<7%, IMPROVED GLYCEMIC CONTROL  through collaboration with PharmD and provider.    Interventions: 1:1 collaboration with Rakes, Connye Burkitt, FNP regarding development and update of comprehensive plan of care as evidenced by provider attestation and co-signature Inter-disciplinary care team collaboration (see longitudinal plan of care) Comprehensive medication review performed; medication list updated in electronic medical record  Diabetes: New goal. Uncontrolled-A1C IS 6.3% in 01/2021, but blood sugar continues to rise; current treatment: GLIPIZIDE XL 10MG  DAILY, METFORMIN (1 gram daily-GFR 63);  Patient reports she is feeling tired/weak on ozempic 47-month trial.  This is her 2nd time trying a GLP1.  She would like to discontinue it Lantus last dose 03/25/21, last dose ozempic was 03/21/21 Current glucose readings:  FBG 177, 140, 129, 125 109 164 196 124 180 192 PM after dinner  383, 154 239 202 267 237 Denies hypoglycemic/hyperglycemic symptoms Discussed meal planning options and Plate method for healthy eating Avoid sugary drinks and desserts Incorporate balanced protein, non starchy veggies, 1 serving of carbohydrate with each meal Increase water intake Increase physical activity as able Current exercise: N/A Blood pressure and cholesterol controlled-continue Recommended GLP1 (RYBELSUS) OR SGLT2--PATIENT DECLINED; patient/daughter-in-law requested pioglitazone Will call in [place] 2 CGM to see if it is  covered   Patient Goals/Self-Care Activities patient will:  - take medications as prescribed as evidenced by patient report and record review check glucose daily, document, and provide at future appointments engage in dietary modifications by [place]      Please call the care guide team at [phone] if you need to cancel or reschedule your appointment.   If you are experiencing a Mental Health or [place] or need someone to talk to, please call the Suicide and Crisis Lifeline: 988   The patient verbalized understanding of instructions, educational materials, and care plan provided today and agreed to receive a mailed copy of patient instructions, educational materials, and care plan.   Signature Regina Eck, PharmD, BCPS Clinical Pharmacist, [place]  II Phone [phone]

## 2021-04-01 NOTE — Telephone Encounter (Signed)
Please let patient know:  Prescriptions sent to pharmacy to see if they cover both reader and sensor It appears [place] will allow [place] to send Elenor [place] to the pharmacy this year  Patient can call to find out cost,etc

## 2021-04-01 NOTE — Addendum Note (Signed)
Addended by: Antonietta Barcelona D on: 04/01/2021 03:56 PM   Modules accepted: Orders

## 2021-04-01 NOTE — Telephone Encounter (Signed)
Refills failed. resent 

## 2021-04-04 NOTE — Telephone Encounter (Signed)
BS [place] GOING UP. JULIE has given liBRE 2 BUT SHE CANNOT GET APP ON PHONE TO WORK so they can check bs. Unsure what to do about medication and how to keep sugar down. DIL also had questions about why her xanax has been stopped. She states Wynette is always anxious and coming off xanax has made it even worse since she has been taking for multiple years.I spoke to dr. Lajuana Ripple and she has given me a bs monitor for pt to keep track of bs. Dr Lajuana Ripple recommendations are that she agrees with michelle. Typically after ge 65 xanax is not prescribed. Gottshcalk says she is not willing to change michelles course of treatment. DIL wants to know if julie can get bs reader for [place] 2

## 2021-04-05 NOTE — Telephone Encounter (Signed)
Additional medications were offered to patient for better blood sugar control, but patient and DIL declined at PharmD appt.  What are her current readings? We can increase pioglitazone vs metformin? We can add farxiga  Did patient pick up libre reader/sensor from the pharmacy? I called in to pharmacy to see if insurance will cover for $0

## 2021-04-07 NOTE — Telephone Encounter (Signed)
LMTCB

## 2021-04-08 ENCOUNTER — Encounter: Payer: Self-pay | Admitting: Family Medicine

## 2021-04-08 ENCOUNTER — Ambulatory Visit (INDEPENDENT_AMBULATORY_CARE_PROVIDER_SITE_OTHER): Payer: Medicare Other | Admitting: Family Medicine

## 2021-04-08 VITALS — BP 105/63 | HR 89 | Temp 98.5°F | Ht 62.0 in | Wt 144.0 lb

## 2021-04-08 DIAGNOSIS — E1169 Type 2 diabetes mellitus with other specified complication: Secondary | ICD-10-CM | POA: Diagnosis not present

## 2021-04-08 DIAGNOSIS — F5101 Primary insomnia: Secondary | ICD-10-CM

## 2021-04-08 DIAGNOSIS — Z794 Long term (current) use of insulin: Secondary | ICD-10-CM | POA: Diagnosis not present

## 2021-04-08 MED ORDER — HYDROXYZINE PAMOATE 50 MG PO CAPS: 50.0000 mg | ORAL_CAPSULE | Freq: Every evening | ORAL | 3 refills | Status: DC | PRN

## 2021-04-08 MED ORDER — DAPAGLIFLOZIN PROPANEDIOL 5 MG PO TABS: 5.0000 mg | ORAL_TABLET | Freq: Every day | ORAL | 4 refills | Status: DC

## 2021-04-08 NOTE — Telephone Encounter (Signed)
Patient seen in clinic today. Issues were dealt with

## 2021-04-08 NOTE — Patient Instructions (Signed)

## 2021-04-08 NOTE — Progress Notes (Signed)
°  ° °Subjective:  °Patient ID: Maria [name], female    DOB: 01/01/1956, 65 y.o.   MRN: [id number] ° °Patient Care Team: °Rakes, Linda M, FNP as PCP - General (Family Medicine) °Pruitt, Julie D, RPH as Pharmacist (Family Medicine)  ° °Chief Complaint:  Diabetes and Insomnia ° ° °HPI: °Maria [name] is a 65 y.o. female presenting on 04/08/2021 for Diabetes and Insomnia ° ° °Pt presents today for reevaluation of insomnia and DM. She was formerly on benzo for sleep and this was discontinued when she switched providers. She was offered referral to psychiatry for evaluation and declined. She has tried melatonin and trazodone without resolution of insomnia. She was prescribed vistaril at previous visit and has only taken 25 mg nightly without improvement in insomnia. She has not tried increasing dose.  °She has tried and failed ozempic and actos therapy for DM due to significant adverse side effects. She is willing to trail Farxiga today as she does not want to go back on insulin unless absolutely necessary. She has received libre device today. Will evaluate readings at next visit.  ° °Diabetes °She presents for her follow-up diabetic visit. She has type 2 diabetes mellitus. Her disease course has been fluctuating. Pertinent negatives for hypoglycemia include no confusion, dizziness, headaches, nervousness/anxiousness, seizures, speech difficulty or tremors. Associated symptoms include fatigue. Pertinent negatives for diabetes include no chest pain, no polydipsia, no polyphagia, no polyuria and no weakness. Diabetic complications include peripheral neuropathy and retinopathy. Risk factors for coronary artery disease include diabetes mellitus, dyslipidemia, hypertension, obesity, sedentary lifestyle and post-menopausal. Current diabetic treatment includes oral agent (dual therapy). She is following a generally healthy diet. She never participates in exercise. An ACE inhibitor/angiotensin II receptor blocker is being  taken.  °Insomnia °Primary symptoms: fragmented sleep, sleep disturbance, frequent awakening, malaise/fatigue.   °The current episode started more than one month. The problem occurs nightly. The symptoms are aggravated by medication changes and pain. Nothing (was on benzo in the past for insomnia, vistaril 25 mg not working, trazodone did not work in past) relieves the symptoms.  ° ° ° °Relevant past medical, surgical, family, and social history reviewed and updated as indicated.  °Allergies and medications reviewed and updated. Data reviewed: Chart in Epic. ° ° °Past Medical History:  °Diagnosis Date  ° Allergy   ° Asthma   ° Cancer (HCC)   ° cervical  ° Complication of anesthesia   ° " I woke and was short of breath."  ° CTS (carpal tunnel syndrome)   ° Diabetes mellitus without complication (HCC)   ° Early cataracts, bilateral   ° Fracture   ° trimalleolar ankle left  ° GERD (gastroesophageal reflux disease)   ° Glaucoma   ° Glaucoma   ° History of kidney stones   ° " I passed it"  ° Hyperlipidemia   ° Hypertension   ° Hypothyroidism   ° Neuropathy   ° Vitamin D deficiency   ° Wears glasses   ° ° °Past Surgical History:  °Procedure Laterality Date  ° ABDOMINAL HYSTERECTOMY    ° APPENDECTOMY    ° CARDIAC CATHETERIZATION  2006  ° negative  ° COLONOSCOPY  2009  ° negative  ° INNER EAR SURGERY Right 1981  ° left shoulder surgery Left 10/2009  ° ORIF ANKLE FRACTURE Left 09/05/2018  ° Procedure: OPEN REDUCTION INTERNAL FIXATION TRIMALLEOLAR FRACTURE POSSIBLE SYNDEMOSIS;  Surgeon: Adair, Christopher R, MD;  Location: [facility] OR;  Service: Orthopedics;  Laterality: Left;  °   TONSILLECTOMY    ° TUBAL LIGATION    ° ° °Social History  ° °Socioeconomic History  ° Marital status: Married  °  Spouse name: Mark  ° Number of children: 2  ° Years of education: Not on file  ° Highest education level: Not on file  °Occupational History  ° Not on file  °Tobacco Use  ° Smoking status: Former  °  Packs/day: 1.00  °  Years: 15.00  °  Pack  years: 15.00  °  Types: Cigarettes  °  Quit date: 06/05/2001  °  Years since quitting: 19.8  ° Smokeless tobacco: Never  °Vaping Use  ° Vaping Use: Never used  °Substance and Sexual Activity  ° Alcohol use: No  ° Drug use: No  ° Sexual activity: Not on file  °Other Topics Concern  ° Not on file  °Social History Narrative  ° 2 sons.  ° 3 grandchildren.  ° °Social Determinants of Health  ° °Financial Resource Strain: Low Risk   ° Difficulty of Paying Living Expenses: Not hard at all  °Food Insecurity: No Food Insecurity  ° Worried About Running Out of Food in the Last Year: Never true  ° Ran Out of Food in the Last Year: Never true  °Transportation Needs: No Transportation Needs  ° Lack of Transportation (Medical): No  ° Lack of Transportation (Non-Medical): No  °Physical Activity: Insufficiently Active  ° Days of Exercise per Week: 3 days  ° Minutes of Exercise per Session: 30 min  °Stress: No Stress Concern Present  ° Feeling of Stress : Not at all  °Social Connections: Socially Integrated  ° Frequency of Communication with Friends and Family: More than three times a week  ° Frequency of Social Gatherings with Friends and Family: More than three times a week  ° Attends Religious Services: 1 to 4 times per year  ° Active Member of Clubs or Organizations: Yes  ° Attends Club or Organization Meetings: 1 to 4 times per year  ° Marital Status: Married  °Intimate Partner Violence: Not At Risk  ° Fear of Current or Ex-Partner: No  ° Emotionally Abused: No  ° Physically Abused: No  ° Sexually Abused: No  ° ° °Outpatient Encounter Medications as of 04/08/2021  °Medication Sig  ° acetaminophen (TYLENOL) 500 MG tablet Take 1,000 mg by mouth every 6 (six) hours as needed for moderate pain.  ° acyclovir cream (ZOVIRAX) 5 % 1 application  ° albuterol (PROVENTIL) (2.5 MG/3ML) 0.083% nebulizer solution USE 1 VIAL IN NEBULIZER EVERY 4 HOURS AS NEEDED FOR WHEEZING (Patient taking differently: Take 2.5 mg by nebulization every 4 (four)  hours as needed for wheezing or shortness of breath.)  ° albuterol (VENTOLIN HFA) 108 (90 Base) MCG/ACT inhaler Inhale 2 puffs into the lungs every 6 (six) hours as needed for wheezi ng orshortness of breath.  ° cetirizine (ZYRTEC) 10 MG tablet 1 tablet  ° Cholecalciferol (VITAMIN D3) 50 MCG (2000 UT) capsule 1 tablet  ° Continuous Blood Gluc Receiver (FREESTYLE LIBRE 2 READER) DEVI Use to test blood sugar continuously. DX: E11.65  ° Continuous Blood Gluc Sensor (FREESTYLE LIBRE 2 SENSOR) MISC Use to test blood sugar continuously. DX: E11.65  ° dapagliflozin propanediol (FARXIGA) 5 MG TABS tablet Take 1 tablet (5 mg total) by mouth daily before breakfast.  ° Fluticasone-Umeclidin-Vilant 200-62.5-25 MCG/ACT AEPB INHALE 1 PUFF ONCE DAILY  ° glipiZIDE (GLUCOTROL XL) 10 MG 24 hr tablet Take 1 tablet (10 mg total) by mouth daily.  °   hydrOXYzine (VISTARIL) 50 MG capsule Take 1 capsule (50 mg total) by mouth at bedtime and may repeat dose one time if needed.  ° ibuprofen (ADVIL) 200 MG tablet Take 400 mg by mouth every 6 (six) hours as needed for moderate pain.  ° ketoconazole (NIZORAL) 2 % cream Apply bid to rash  ° ketorolac (ACULAR) 0.4 % SOLN Apply to eye.  ° levothyroxine (SYNTHROID) 50 MCG tablet Take 1 tablet (50 mcg total) by mouth daily.  ° lisinopril (ZESTRIL) 5 MG tablet TAKE (1) TABLET DAILY FOR HIGH BLOOD PRESSURE.  ° LUMIGAN 0.01 % SOLN Place 1 drop into both eyes at bedtime.   ° metFORMIN (GLUCOPHAGE) 500 MG tablet TAKE 2 TABLETS DAILY WITH A MEAL  ° mometasone (ELOCON) 0.1 % cream Apply 1 application topically daily. (Patient taking differently: Apply 1 application topically daily as needed (rash).)  ° montelukast (SINGULAIR) 10 MG tablet TAKE 1 TABLET DAILY  ° omeprazole (PRILOSEC) 40 MG capsule TAKE 1 CAPSULE ONCE DAILY 30 MINUTES BEFORE MEALS  ° ondansetron (ZOFRAN-ODT) 4 MG disintegrating tablet Take 1 tablet (4 mg total) by mouth every 8 (eight) hours as needed for nausea or vomiting.  ° phenylephrine  (SUDAFED PE) 10 MG TABS tablet Take 10 mg by mouth every 4 (four) hours as needed (sinus congestion).  ° rosuvastatin (CRESTOR) 20 MG tablet TAKE 1 TABLET DAILY  ° triamcinolone cream (KENALOG) 0.1 % Apply 1 application topically 2 (two) times daily as needed (dry skin).  ° ULTICARE SHORT PEN NEEDLES 31G X 8 MM MISC USE AS DIRECTED  ° [DISCONTINUED] hydrOXYzine (VISTARIL) 25 MG capsule Take 1 capsule (25 mg total) by mouth at bedtime as needed and may repeat dose one time if needed.  ° [DISCONTINUED] pioglitazone (ACTOS) 15 MG tablet Take 1 tablet (15 mg total) by mouth daily.  ° °No facility-administered encounter medications on file as of 04/08/2021.  ° ° °Allergies  °Allergen Reactions  ° Ozempic (0.25 Or 0.5 Mg-Dose) [Semaglutide(0.25 Or 0.5mg-Dos)] Nausea And Vomiting  °  N/v, no energy  ° ° °Review of Systems  °Constitutional:  Positive for activity change, fatigue and malaise/fatigue. Negative for appetite change, chills, diaphoresis, fever and unexpected weight change.  °Respiratory:  Negative for cough and shortness of breath.   °Cardiovascular:  Negative for chest pain, palpitations and leg swelling.  °Endocrine: Negative for cold intolerance, heat intolerance, polydipsia, polyphagia and polyuria.  °Genitourinary:  Negative for decreased urine volume and difficulty urinating.  °Musculoskeletal:  Positive for arthralgias.  °Neurological:  Negative for dizziness, tremors, seizures, syncope, facial asymmetry, speech difficulty, weakness, light-headedness, numbness and headaches.  °Psychiatric/Behavioral:  Positive for sleep disturbance. Negative for agitation, behavioral problems, confusion, decreased concentration, dysphoric mood, hallucinations, self-injury and suicidal ideas. The patient has insomnia. The patient is not nervous/anxious and is not hyperactive.   °All other systems reviewed and are negative. ° °   ° °Objective:  °BP 105/63    Pulse 89    Temp 98.5 °F (36.9 °C)    Ht 5' 2" (1.575 m)    Wt 144  lb (65.3 kg)    SpO2 95%    BMI 26.34 kg/m²   ° °Wt Readings from Last 3 Encounters:  °04/08/21 144 lb (65.3 kg)  °02/25/21 153 lb (69.4 kg)  °02/16/21 149 lb (67.6 kg)  ° ° °Physical Exam °Vitals and nursing note reviewed.  °Constitutional:   °   Appearance: Normal appearance. She is obese.  °HENT:  °   Head: Normocephalic and atraumatic.  °  Eyes:     Conjunctiva/sclera: Conjunctivae normal.     Pupils: Pupils are equal, round, and reactive to light.  Cardiovascular:     Rate and Rhythm: Normal rate and regular rhythm.     Heart sounds: Normal heart sounds.  Pulmonary:     Effort: Pulmonary effort is normal.     Breath sounds: Normal breath sounds.  Skin:    General: Skin is warm and dry.     Capillary Refill: Capillary refill takes less than 2 seconds.  Neurological:     General: No focal deficit present.     Mental Status: She is alert and oriented to person, place, and time.  Psychiatric:        Mood and Affect: Mood normal.        Behavior: Behavior normal.        Thought Content: Thought content normal.        Judgment: Judgment normal.    Results for orders placed or performed in visit on 01/26/21  CBC with Differential/Platelet  Result Value Ref Range   WBC 6.5 3.4 - 10.8 x10E3/uL   RBC 4.87 3.77 - 5.28 x10E6/uL   Hemoglobin 14.4 11.1 - 15.9 g/dL   Hematocrit 42.3 34.0 - 46.6 %   MCV 87 79 - 97 fL   MCH 29.6 26.6 - 33.0 pg   MCHC 34.0 31.5 - 35.7 g/dL   RDW 12.9 11.7 - 15.4 %   Platelets 237 150 - 450 x10E3/uL   Neutrophils 65 Not Estab. %   Lymphs 21 Not Estab. %   Monocytes 9 Not Estab. %   Eos 4 Not Estab. %   Basos 1 Not Estab. %   Neutrophils Absolute 4.3 1.4 - 7.0 x10E3/uL   Lymphocytes Absolute 1.4 0.7 - 3.1 x10E3/uL   Monocytes Absolute 0.6 0.1 - 0.9 x10E3/uL   EOS (ABSOLUTE) 0.3 0.0 - 0.4 x10E3/uL   Basophils Absolute 0.0 0.0 - 0.2 x10E3/uL   Immature Granulocytes 0 Not Estab. %   Immature Grans (Abs) 0.0 0.0 - 0.1 x10E3/uL  CMP14+EGFR  Result Value Ref  Range   Glucose 102 (H) 70 - 99 mg/dL   BUN 11 8 - 27 mg/dL   Creatinine, Ser 0.95 0.57 - 1.00 mg/dL   eGFR 67 >59 mL/min/1.73   BUN/Creatinine Ratio 12 12 - 28   Sodium 140 134 - 144 mmol/L   Potassium 4.3 3.5 - 5.2 mmol/L   Chloride 104 96 - 106 mmol/L   CO2 21 20 - 29 mmol/L   Calcium 9.3 8.7 - 10.3 mg/dL   Total Protein 7.0 6.0 - 8.5 g/dL   Albumin 4.7 3.8 - 4.8 g/dL   Globulin, Total 2.3 1.5 - 4.5 g/dL   Albumin/Globulin Ratio 2.0 1.2 - 2.2   Bilirubin Total 0.7 0.0 - 1.2 mg/dL   Alkaline Phosphatase 131 (H) 44 - 121 IU/L   AST 21 0 - 40 IU/L   ALT 15 0 - 32 IU/L  Microalbumin / creatinine urine ratio  Result Value Ref Range   Creatinine, Urine 107.8 Not Estab. mg/dL   Microalbumin, Urine 68.1 Not Estab. ug/mL   Microalb/Creat Ratio 63 (H) 0 - 29 mg/g creat  Lipid panel  Result Value Ref Range   Cholesterol, Total 116 100 - 199 mg/dL   Triglycerides 183 (H) 0 - 149 mg/dL   HDL 39 (L) >39 mg/dL   VLDL Cholesterol Cal 30 5 - 40 mg/dL   LDL Chol Calc (NIH) 47 0 -  99 mg/dL  ° Chol/HDL Ratio 3.0 0.0 - 4.4 ratio  °Thyroid Panel With TSH  °Result Value Ref Range  ° TSH 0.207 (L) 0.450 - 4.500 uIU/mL  ° T4, Total 11.4 4.5 - 12.0 ug/dL  ° T3 Uptake Ratio 30 24 - 39 %  ° Free Thyroxine Index 3.4 1.2 - 4.9  °VITAMIN D 25 Hydroxy (Vit-D Deficiency, Fractures)  °Result Value Ref Range  ° Vit D, 25-Hydroxy 50.0 30.0 - 100.0 ng/mL  °Bayer DCA Hb A1c Waived  °Result Value Ref Range  ° HB A1C (BAYER DCA - WAIVED) 6.3 (H) 4.8 - 5.6 %  ° °   ° °Pertinent labs & imaging results that were available during my care of the patient were reviewed by me and considered in my medical decision making. ° °Assessment & Plan:  °Maria [name] was seen today for diabetes and insomnia. ° °Diagnoses and all orders for this visit: ° °Type 2 diabetes mellitus with other specified complication, without long-term current use of insulin (HCC) °Has been off of insulin for several weeks. Unable to tolerate Ozempic and Actos due to  adverse effects. Willing to trial Farxiga. Keep follow up as scheduled. Libre device placed and will review readings at next visit.  °-     dapagliflozin propanediol (FARXIGA) 5 MG TABS tablet; Take 1 tablet (5 mg total) by mouth daily before breakfast. ° °Primary insomnia °Discussed appropriate treatment options for insomnia. Will trial increasing Vistaril dosing today. Pt aware to continue melatonin. Report new or worsening symptoms.  °-     hydrOXYzine (VISTARIL) 50 MG capsule; Take 1 capsule (50 mg total) by mouth at bedtime and may repeat dose one time if needed. ° °  ° °Continue all other maintenance medications. ° °Follow up plan: °Return in about 3 months (around 07/07/2021) for DM. ° ° °Continue healthy lifestyle choices, including diet (rich in fruits, vegetables, and lean proteins, and low in salt and simple carbohydrates) and exercise (at least 30 minutes of moderate physical activity daily). ° °Educational handout given for DM ° °The above assessment and management plan was discussed with the patient. The patient verbalized understanding of and has agreed to the management plan. Patient is aware to call the clinic if they develop any new symptoms or if symptoms persist or worsen. Patient is aware when to return to the clinic for a follow-up visit. Patient educated on when it is appropriate to go to the emergency department.  ° °Michelle Rakes, FNP-C °[facility] °[phone] ° ° °

## 2021-04-19 DIAGNOSIS — Z794 Long term (current) use of insulin: Secondary | ICD-10-CM

## 2021-04-19 DIAGNOSIS — E785 Hyperlipidemia, unspecified: Secondary | ICD-10-CM | POA: Diagnosis not present

## 2021-04-19 DIAGNOSIS — E1169 Type 2 diabetes mellitus with other specified complication: Secondary | ICD-10-CM

## 2021-05-09 ENCOUNTER — Other Ambulatory Visit: Payer: Self-pay | Admitting: Family Medicine

## 2021-05-12 ENCOUNTER — Other Ambulatory Visit: Payer: Self-pay | Admitting: Family Medicine

## 2021-05-12 DIAGNOSIS — Z794 Long term (current) use of insulin: Secondary | ICD-10-CM

## 2021-05-12 DIAGNOSIS — E1140 Type 2 diabetes mellitus with diabetic neuropathy, unspecified: Secondary | ICD-10-CM

## 2021-05-27 ENCOUNTER — Ambulatory Visit: Payer: Medicare Other | Admitting: Family Medicine

## 2021-05-27 DIAGNOSIS — H401132 Primary open-angle glaucoma, bilateral, moderate stage: Secondary | ICD-10-CM | POA: Diagnosis not present

## 2021-05-31 ENCOUNTER — Encounter: Payer: Self-pay | Admitting: Family Medicine

## 2021-05-31 ENCOUNTER — Ambulatory Visit (INDEPENDENT_AMBULATORY_CARE_PROVIDER_SITE_OTHER): Payer: Medicare Other | Admitting: Family Medicine

## 2021-05-31 VITALS — BP 110/64 | HR 85 | Temp 98.7°F | Ht 62.0 in | Wt 137.0 lb

## 2021-05-31 DIAGNOSIS — E1169 Type 2 diabetes mellitus with other specified complication: Secondary | ICD-10-CM

## 2021-05-31 DIAGNOSIS — K219 Gastro-esophageal reflux disease without esophagitis: Secondary | ICD-10-CM

## 2021-05-31 DIAGNOSIS — F5101 Primary insomnia: Secondary | ICD-10-CM

## 2021-05-31 DIAGNOSIS — E1159 Type 2 diabetes mellitus with other circulatory complications: Secondary | ICD-10-CM | POA: Diagnosis not present

## 2021-05-31 DIAGNOSIS — Z794 Long term (current) use of insulin: Secondary | ICD-10-CM | POA: Diagnosis not present

## 2021-05-31 LAB — BAYER DCA HB A1C WAIVED: HB A1C (BAYER DCA - WAIVED): 8.1 % — ABNORMAL HIGH (ref 4.8–5.6)

## 2021-05-31 MED ORDER — PANTOPRAZOLE SODIUM 40 MG PO TBEC: 40.0000 mg | DELAYED_RELEASE_TABLET | Freq: Every day | ORAL | 3 refills | Status: DC

## 2021-05-31 MED ORDER — ZALEPLON 5 MG PO CAPS: 5.0000 mg | ORAL_CAPSULE | Freq: Every evening | ORAL | 1 refills | Status: DC | PRN

## 2021-05-31 MED ORDER — METFORMIN HCL 1000 MG PO TABS: 1000.0000 mg | ORAL_TABLET | Freq: Two times a day (BID) | ORAL | 3 refills | Status: DC

## 2021-05-31 NOTE — Progress Notes (Signed)
Subjective:  Patient ID: Maria [name], female    DOB: 23-Mar-1955, 66 y.o.   MRN: [id number]  Patient Care Team: Sonny Masters, FNP as PCP - General (Family Medicine) Danella Maiers, [facility] as Pharmacist (Family Medicine)   Chief Complaint:  Diabetes   HPI: Maria [name] is a 66 y.o. female presenting on 05/31/2021 for Diabetes  Patient presents today with complaints of her blood sugar being in the 200 range on a consistent basis.  She is currently on metformin 1000 mg once daily, Farxiga 5 mg once daily, and glipizide 10 mg daily.  She has tried and failed several other diabetic medications due to side effects.  She does report neuropathy symptoms in her right foot.  She denies any polyuria, polydipsia, or polyphagia.  She also reports increased acid reflux.  States she has been on Prilosec for several years and does not feel this is working.  States she gets epigastric burning after certain meals.  No other associated symptoms.  She has been trying Vistaril for sleep without relief of insomnia.  States that when she does get asleep she stays asleep but it still takes her forever to go to sleep.  No sedation with the Vistaril.  She has tried trazodone in the past and failed.   Relevant past medical, surgical, family, and social history reviewed and updated as indicated.  Allergies and medications reviewed and updated. Data reviewed: Chart in Epic.   Past Medical History:  Diagnosis Date   Allergy    Asthma    Cancer (HCC)    cervical   Complication of anesthesia    " I woke and was short of breath."   CTS (carpal tunnel syndrome)    Diabetes mellitus without complication (HCC)    Early cataracts, bilateral    Fracture    trimalleolar ankle left   GERD (gastroesophageal reflux disease)    Glaucoma    Glaucoma    History of kidney stones    " I passed it"   Hyperlipidemia    Hypertension    Hypothyroidism    Neuropathy    Vitamin D deficiency    Wears glasses      Past Surgical History:  Procedure Laterality Date   ABDOMINAL HYSTERECTOMY     APPENDECTOMY     CARDIAC CATHETERIZATION  2006   negative   COLONOSCOPY  2009   negative   INNER EAR SURGERY Right 1981   left shoulder surgery Left 10/2009   ORIF ANKLE FRACTURE Left 09/05/2018   Procedure: OPEN REDUCTION INTERNAL FIXATION TRIMALLEOLAR FRACTURE POSSIBLE SYNDEMOSIS;  Surgeon: Terance Hart, MD;  Location: [facility] OR;  Service: Orthopedics;  Laterality: Left;   TONSILLECTOMY     TUBAL LIGATION      Social History   Socioeconomic History   Marital status: Married    Spouse name: Loraine Leriche   Number of children: 2   Years of education: Not on file   Highest education level: Not on file  Occupational History   Not on file  Tobacco Use   Smoking status: Former    Packs/day: 1.00    Years: 15.00    Pack years: 15.00    Types: Cigarettes    Quit date: 06/05/2001    Years since quitting: 20.0   Smokeless tobacco: Never  Vaping Use   Vaping Use: Never used  Substance and Sexual Activity   Alcohol use: No   Drug use: No   Sexual activity: Not  on file  Other Topics Concern   Not on file  Social History Narrative   2 sons.   3 grandchildren.   Social Determinants of Health   Financial Resource Strain: Low Risk    Difficulty of Paying Living Expenses: Not hard at all  Food Insecurity: No Food Insecurity   Worried About Programme researcher, broadcasting/film/video in the Last Year: Never true   Ran Out of Food in the Last Year: Never true  Transportation Needs: No Transportation Needs   Lack of Transportation (Medical): No   Lack of Transportation (Non-Medical): No  Physical Activity: Insufficiently Active   Days of Exercise per Week: 3 days   Minutes of Exercise per Session: 30 min  Stress: No Stress Concern Present   Feeling of Stress : Not at all  Social Connections: Socially Integrated   Frequency of Communication with Friends and Family: More than three times a week   Frequency of Social  Gatherings with Friends and Family: More than three times a week   Attends Religious Services: 1 to 4 times per year   Active Member of [employer] or Organizations: Yes   Attends Banker Meetings: 1 to 4 times per year   Marital Status: Married  Catering manager Violence: Not At Risk   Fear of Current or Ex-Partner: No   Emotionally Abused: No   Physically Abused: No   Sexually Abused: No    Outpatient Encounter Medications as of 05/31/2021  Medication Sig   acetaminophen (TYLENOL) 500 MG tablet Take 1,000 mg by mouth every 6 (six) hours as needed for moderate pain.   acyclovir cream (ZOVIRAX) 5 % 1 application   albuterol (VENTOLIN HFA) 108 (90 Base) MCG/ACT inhaler Inhale 2 puffs into the lungs every 6 (six) hours as needed for wheezi ng orshortness of breath.   bimatoprost (LUMIGAN) 0.01 % SOLN 1 drop into affected eye in the evening Ophthalmic Once a day   cetirizine (ZYRTEC) 10 MG tablet 1 tablet   cetirizine (ZYRTEC) 10 MG tablet 1 tablet Orally Once a day for 30 day(s)   Cholecalciferol (VITAMIN D3) 50 MCG (2000 UT) capsule 1 tablet   Cholecalciferol 50 MCG (2000 UT) CAPS 1 tablet Orally Once a day   Continuous Blood Gluc Receiver (FREESTYLE LIBRE 2 READER) DEVI Use to test blood sugar continuously. DX: E11.65   Continuous Blood Gluc Sensor (FREESTYLE LIBRE 2 SENSOR) MISC Use to test blood sugar continuously. DX: E11.65   dapagliflozin propanediol (FARXIGA) 5 MG TABS tablet Take 1 tablet (5 mg total) by mouth daily before breakfast.   glipiZIDE (GLUCOTROL) 10 MG tablet 1 tablet 30 minutes before breakfast Orally Once a day   ibuprofen (ADVIL) 200 MG tablet Take 400 mg by mouth every 6 (six) hours as needed for moderate pain.   ketoconazole (NIZORAL) 2 % cream Apply bid to rash   ketorolac (ACULAR) 0.4 % SOLN Apply to eye.   levothyroxine (SYNTHROID) 50 MCG tablet Take 1 tablet (50 mcg total) by mouth daily.   lisinopril (ZESTRIL) 5 MG tablet TAKE (1) TABLET DAILY FOR HIGH  BLOOD PRESSURE.   LUMIGAN 0.01 % SOLN Place 1 drop into both eyes at bedtime.    metFORMIN (GLUCOPHAGE) 1000 MG tablet Take 1 tablet (1,000 mg total) by mouth 2 (two) times daily with a meal.   mometasone (ELOCON) 0.1 % cream Apply 1 application topically daily. (Patient taking differently: Apply 1 application. topically daily as needed (rash).)   montelukast (SINGULAIR) 10 MG tablet TAKE  1 TABLET DAILY   ondansetron (ZOFRAN-ODT) 4 MG disintegrating tablet Take 1 tablet (4 mg total) by mouth every 8 (eight) hours as needed for nausea or vomiting.   pantoprazole (PROTONIX) 40 MG tablet Take 1 tablet (40 mg total) by mouth daily.   phenylephrine (SUDAFED PE) 10 MG TABS tablet Take 10 mg by mouth every 4 (four) hours as needed (sinus congestion).   rosuvastatin (CRESTOR) 20 MG tablet TAKE 1 TABLET DAILY   triamcinolone cream (KENALOG) 0.1 % Apply 1 application topically 2 (two) times daily as needed (dry skin).   ULTICARE SHORT PEN NEEDLES 31G X 8 MM MISC USE AS DIRECTED   vitamin B-12 (CYANOCOBALAMIN) 1000 MCG tablet Take 1,000 mcg by mouth daily.   zaleplon (SONATA) 5 MG capsule Take 1 capsule (5 mg total) by mouth at bedtime as needed for sleep.   [DISCONTINUED] acyclovir cream (ZOVIRAX) 5 % 1 application Externally Five times a day for 4 day(s)   [DISCONTINUED] albuterol (PROVENTIL) (2.5 MG/3ML) 0.083% nebulizer solution USE 1 VIAL IN NEBULIZER EVERY 4 HOURS AS NEEDED FOR WHEEZING (Patient taking differently: Take 2.5 mg by nebulization every 4 (four) hours as needed for wheezing or shortness of breath.)   [DISCONTINUED] albuterol (VENTOLIN HFA) 108 (90 Base) MCG/ACT inhaler 2 puffs as needed Inhalation every 4 hrs   [DISCONTINUED] Fluticasone-Umeclidin-Vilant 200-62.5-25 MCG/ACT AEPB INHALE 1 PUFF ONCE DAILY   [DISCONTINUED] glipiZIDE (GLUCOTROL XL) 10 MG 24 hr tablet TAKE (1) TABLET DAILY BE- FORE BREAKFAST.   [DISCONTINUED] hydrOXYzine (VISTARIL) 50 MG capsule Take 1 capsule (50 mg total) by  mouth at bedtime and may repeat dose one time if needed.   [DISCONTINUED] metFORMIN (GLUCOPHAGE) 500 MG tablet TAKE 2 TABLETS DAILY WITH A MEAL   [DISCONTINUED] montelukast (SINGULAIR) 10 MG tablet 1 tablet Orally Once a day   [DISCONTINUED] omeprazole (PRILOSEC) 40 MG capsule TAKE 1 CAPSULE ONCE DAILY 30 MINUTES BEFORE MEALS   Fluticasone-Umeclidin-Vilant (TRELEGY ELLIPTA) 200-62.5-25 MCG/ACT AEPB 1 puff Inhalation Once a day   No facility-administered encounter medications on file as of 05/31/2021.    Allergies  Allergen Reactions   Ozempic (0.25 Or 0.5 Mg-Dose) [Semaglutide(0.25 Or 0.5mg -Dos)] Nausea And Vomiting    N/v, no energy    Review of Systems  Constitutional:  Negative for activity change, appetite change, chills, diaphoresis, fatigue, fever and unexpected weight change.  HENT: Negative.    Eyes: Negative.  Negative for photophobia and visual disturbance.  Respiratory:  Negative for cough, chest tightness and shortness of breath.   Cardiovascular:  Negative for chest pain, palpitations and leg swelling.  Gastrointestinal:  Positive for abdominal pain (GERD). Negative for abdominal distention, anal bleeding, blood in stool, constipation, diarrhea, nausea, rectal pain and vomiting.  Endocrine: Negative.  Negative for polydipsia, polyphagia and polyuria.  Genitourinary:  Negative for decreased urine volume, difficulty urinating, dysuria, frequency and urgency.  Musculoskeletal:  Negative for arthralgias and myalgias.  Skin: Negative.   Allergic/Immunologic: Negative.   Neurological:  Positive for numbness (intermittent in right foot). Negative for dizziness, tremors, seizures, syncope, facial asymmetry, speech difficulty, weakness, light-headedness and headaches.  Hematological: Negative.   Psychiatric/Behavioral:  Negative for confusion, hallucinations, sleep disturbance and suicidal ideas.   All other systems reviewed and are negative.      Objective:  BP 110/64   Pulse  85   Temp 98.7 F (37.1 C)   Ht 5\' 2"  (1.575 m)   Wt 137 lb (62.1 kg)   SpO2 97%   BMI 25.06 kg/m  Wt Readings from Last 3 Encounters:  05/31/21 137 lb (62.1 kg)  04/08/21 144 lb (65.3 kg)  02/25/21 153 lb (69.4 kg)    Physical Exam Vitals and nursing note reviewed.  Constitutional:      General: She is not in acute distress.    Appearance: Normal appearance. She is well-developed and well-groomed. She is obese. She is not ill-appearing, toxic-appearing or diaphoretic.  HENT:     Head: Normocephalic and atraumatic.     Jaw: There is normal jaw occlusion.     Right Ear: Hearing normal.     Left Ear: Hearing normal.     Nose: Nose normal.     Mouth/Throat:     Lips: Pink.     Mouth: Mucous membranes are moist.     Pharynx: Oropharynx is clear. Uvula midline.  Eyes:     General: Lids are normal.     Extraocular Movements: Extraocular movements intact.     Conjunctiva/sclera: Conjunctivae normal.     Pupils: Pupils are equal, round, and reactive to light.  Neck:     Thyroid: No thyroid mass, thyromegaly or thyroid tenderness.     Vascular: No carotid bruit or JVD.     Trachea: Trachea and phonation normal.  Cardiovascular:     Rate and Rhythm: Normal rate and regular rhythm.     Chest Wall: PMI is not displaced.     Pulses: Normal pulses.     Heart sounds: Normal heart sounds. No murmur heard.   No friction rub. No gallop.  Pulmonary:     Effort: Pulmonary effort is normal.     Breath sounds: Normal breath sounds.  Abdominal:     General: There is no abdominal bruit.     Palpations: There is no hepatomegaly or splenomegaly.  Musculoskeletal:        General: Normal range of motion.     Cervical back: Normal range of motion and neck supple.     Right lower leg: No edema.     Left lower leg: No edema.  Lymphadenopathy:     Cervical: No cervical adenopathy.  Skin:    General: Skin is warm and dry.     Capillary Refill: Capillary refill takes less than 2 seconds.      Coloration: Skin is not cyanotic, jaundiced or pale.     Findings: No rash.  Neurological:     General: No focal deficit present.     Mental Status: She is alert and oriented to person, place, and time.     Sensory: Sensation is intact.     Motor: Motor function is intact.     Coordination: Coordination is intact.     Gait: Gait is intact.     Deep Tendon Reflexes: Reflexes are normal and symmetric.  Psychiatric:        Attention and Perception: Attention and perception normal.        Mood and Affect: Mood and affect normal.        Speech: Speech normal.        Behavior: Behavior normal. Behavior is cooperative.        Thought Content: Thought content normal.        Cognition and Memory: Cognition and memory normal.        Judgment: Judgment normal.    Results for orders placed or performed in visit on 01/26/21  CBC with Differential/Platelet  Result Value Ref Range   WBC 6.5 3.4 - 10.8 x10E3/uL   RBC  4.87 3.77 - 5.28 x10E6/uL   Hemoglobin 14.4 11.1 - 15.9 g/dL   Hematocrit 09.8 11.9 - 46.6 %   MCV 87 79 - 97 fL   MCH 29.6 26.6 - 33.0 pg   MCHC 34.0 31.5 - 35.7 g/dL   RDW 14.7 82.9 - 56.2 %   Platelets 237 150 - 450 x10E3/uL   Neutrophils 65 Not Estab. %   Lymphs 21 Not Estab. %   Monocytes 9 Not Estab. %   Eos 4 Not Estab. %   Basos 1 Not Estab. %   Neutrophils Absolute 4.3 1.4 - 7.0 x10E3/uL   Lymphocytes Absolute 1.4 0.7 - 3.1 x10E3/uL   Monocytes Absolute 0.6 0.1 - 0.9 x10E3/uL   EOS (ABSOLUTE) 0.3 0.0 - 0.4 x10E3/uL   Basophils Absolute 0.0 0.0 - 0.2 x10E3/uL   Immature Granulocytes 0 Not Estab. %   Immature Grans (Abs) 0.0 0.0 - 0.1 x10E3/uL  CMP14+EGFR  Result Value Ref Range   Glucose 102 (H) 70 - 99 mg/dL   BUN 11 8 - 27 mg/dL   Creatinine, Ser 1.30 0.57 - 1.00 mg/dL   eGFR 67 >86 VH/QIO/9.62   BUN/Creatinine Ratio 12 12 - 28   Sodium 140 134 - 144 mmol/L   Potassium 4.3 3.5 - 5.2 mmol/L   Chloride 104 96 - 106 mmol/L   CO2 21 20 - 29 mmol/L    Calcium 9.3 8.7 - 10.3 mg/dL   Total Protein 7.0 6.0 - 8.5 g/dL   Albumin 4.7 3.8 - 4.8 g/dL   Globulin, Total 2.3 1.5 - 4.5 g/dL   Albumin/Globulin Ratio 2.0 1.2 - 2.2   Bilirubin Total 0.7 0.0 - 1.2 mg/dL   Alkaline Phosphatase 131 (H) 44 - 121 IU/L   AST 21 0 - 40 IU/L   ALT 15 0 - 32 IU/L  Microalbumin / creatinine urine ratio  Result Value Ref Range   Creatinine, Urine 107.8 Not Estab. mg/dL   Microalbumin, Urine 95.2 Not Estab. ug/mL   Microalb/Creat Ratio 63 (H) 0 - 29 mg/g creat  Lipid panel  Result Value Ref Range   Cholesterol, Total 116 100 - 199 mg/dL   Triglycerides 841 (H) 0 - 149 mg/dL   HDL 39 (L) >32 mg/dL   VLDL Cholesterol Cal 30 5 - 40 mg/dL   LDL Chol Calc (NIH) 47 0 - 99 mg/dL   Chol/HDL Ratio 3.0 0.0 - 4.4 ratio  Thyroid Panel With TSH  Result Value Ref Range   TSH 0.207 (L) 0.450 - 4.500 uIU/mL   T4, Total 11.4 4.5 - 12.0 ug/dL   T3 Uptake Ratio 30 24 - 39 %   Free Thyroxine Index 3.4 1.2 - 4.9  VITAMIN D 25 Hydroxy (Vit-D Deficiency, Fractures)  Result Value Ref Range   Vit D, 25-Hydroxy 50.0 30.0 - 100.0 ng/mL  Bayer DCA Hb A1c Waived  Result Value Ref Range   HB A1C (BAYER DCA - WAIVED) 6.3 (H) 4.8 - 5.6 %       Pertinent labs & imaging results that were available during my care of the patient were reviewed by me and considered in my medical decision making.  Assessment & Plan:  Miosha was seen today for diabetes.  Diagnoses and all orders for this visit:  Type 2 diabetes mellitus with other specified complication, with long-term current use of insulin (HCC) A1C 8.1 in office today.  Will increase metformin to 1000 mg twice daily.  Keep follow-up appointment as scheduled.  Diet  and exercise discussed in detail. -     Bayer DCA Hb A1c Waived -     metFORMIN (GLUCOPHAGE) 1000 MG tablet; Take 1 tablet (1,000 mg total) by mouth 2 (two) times daily with a meal.  Gastroesophageal reflux disease without esophagitis We will change Prilosec to  pantoprazole to see if more beneficial.  If symptoms persist or worsen, will refer to GI. -     pantoprazole (PROTONIX) 40 MG tablet; Take 1 tablet (40 mg total) by mouth daily.  Primary insomnia Has tried and failed Vistaril and trazodone therapy.  Will trial Sonata as prescribed.  Sedation and fall precautions discussed in detail with patient. -     zaleplon (SONATA) 5 MG capsule; Take 1 capsule (5 mg total) by mouth at bedtime as needed for sleep.     Continue all other maintenance medications.  Follow up plan: Return in about 3 months (around 08/31/2021), or if symptoms worsen or fail to improve, for DM, HTN, HLD, Thyroid.   Continue healthy lifestyle choices, including diet (rich in fruits, vegetables, and lean proteins, and low in salt and simple carbohydrates) and exercise (at least 30 minutes of moderate physical activity daily).  Educational handout given for DM  The above assessment and management plan was discussed with the patient. The patient verbalized understanding of and has agreed to the management plan. Patient is aware to call the clinic if they develop any new symptoms or if symptoms persist or worsen. Patient is aware when to return to the clinic for a follow-up visit. Patient educated on when it is appropriate to go to the emergency department.   Kari Baars, FNP-C [facility] [phone]

## 2021-05-31 NOTE — Patient Instructions (Signed)

## 2021-06-06 ENCOUNTER — Other Ambulatory Visit: Payer: Self-pay

## 2021-06-06 ENCOUNTER — Encounter (INDEPENDENT_AMBULATORY_CARE_PROVIDER_SITE_OTHER): Payer: Medicare Other | Admitting: Ophthalmology

## 2021-06-06 ENCOUNTER — Encounter (INDEPENDENT_AMBULATORY_CARE_PROVIDER_SITE_OTHER): Payer: Self-pay | Admitting: Ophthalmology

## 2021-06-06 ENCOUNTER — Ambulatory Visit (INDEPENDENT_AMBULATORY_CARE_PROVIDER_SITE_OTHER): Payer: Medicare Other | Admitting: Ophthalmology

## 2021-06-06 DIAGNOSIS — I10 Essential (primary) hypertension: Secondary | ICD-10-CM

## 2021-06-06 DIAGNOSIS — E113393 Type 2 diabetes mellitus with moderate nonproliferative diabetic retinopathy without macular edema, bilateral: Secondary | ICD-10-CM | POA: Diagnosis not present

## 2021-06-06 DIAGNOSIS — H35033 Hypertensive retinopathy, bilateral: Secondary | ICD-10-CM

## 2021-06-06 DIAGNOSIS — Z961 Presence of intraocular lens: Secondary | ICD-10-CM | POA: Diagnosis not present

## 2021-06-06 DIAGNOSIS — H04123 Dry eye syndrome of bilateral lacrimal glands: Secondary | ICD-10-CM

## 2021-06-06 NOTE — Progress Notes (Addendum)
?[facility] Note ? ?06/06/2021 ? ?  ? ?CHIEF COMPLAINT ?Patient presents for Retina Follow Up ? ? ? ?HISTORY OF PRESENT ILLNESS: ?Maria [name] is a 66 y.o. female who presents to the clinic today for:  ? ?HPI   ? ? Retina Follow Up   ?Patient presents with  Diabetic Retinopathy.  In right eye.  Severity is moderate.  Duration of 3 months.  Since onset it is stable.  I, the attending physician,  performed the HPI with the patient and updated documentation appropriately. ? ?  ?  ? ? Comments   ?Pt here for 3 mo ret f/u for NPDR OD. Pt states VA is good, stable. No issues. Most recent A1C done was 8.1. Pt just began new regimen of Farxiga, Metformin and Glipizide. BS yesterday morning was 134.  ? ?  ?  ?Last edited by Bernarda Caffey, MD on 06/06/2021  1:19 PM.  ?  ? ?Pt states she was put on Ozempic, which made her sick, she states she was taken off of it and put on Farxiga, but it is not helping to get her A1c down, she is also taking glipizide and metformin, her a1c is 8.1 ? ?Referring physician: ?Monna Fam, MD ?[place], [place] [id number] ? ?HISTORICAL INFORMATION:  ? ?Selected notes from the [facility] ?Referred by Dr. Herbert Deaner ?LEE: 01/28/2020 ?BCVA: 20/20 OU ?NPDR OU, PCIOL OU, possible CME OS, POAG OU ?  ? ?CURRENT MEDICATIONS: ?Current Outpatient Medications (Ophthalmic Drugs)  ?Medication Sig  ? bimatoprost (LUMIGAN) 0.01 % SOLN Place 1 drop into both eyes at bedtime.  ? LUMIGAN 0.01 % SOLN Place 1 drop into both eyes at bedtime.   ? ketorolac (ACULAR) 0.4 % SOLN Apply to eye. (Patient not taking: Reported on 06/06/2021)  ? ?No current facility-administered medications for this visit. (Ophthalmic Drugs)  ? ?Current Outpatient Medications (Other)  ?Medication Sig  ? acetaminophen (TYLENOL) 500 MG tablet Take 1,000 mg by mouth every 6 (six) hours as needed for moderate pain.  ? acyclovir cream (ZOVIRAX) 5 % 1 application  ? albuterol (VENTOLIN HFA) 108  (90 Base) MCG/ACT inhaler Inhale 2 puffs into the lungs every 6 (six) hours as needed for wheezi ng orshortness of breath.  ? cetirizine (ZYRTEC) 10 MG tablet 1 tablet  ? cetirizine (ZYRTEC) 10 MG tablet 1 tablet Orally Once a day for 30 day(s)  ? Cholecalciferol (VITAMIN D3) 50 MCG (2000 UT) capsule 1 tablet  ? Cholecalciferol 50 MCG (2000 UT) CAPS 1 tablet Orally Once a day  ? Continuous Blood Gluc Receiver (FREESTYLE LIBRE 2 READER) DEVI Use to test blood sugar continuously. DX: E11.65  ? Continuous Blood Gluc Sensor (FREESTYLE LIBRE 2 SENSOR) MISC Use to test blood sugar continuously. DX: E11.65  ? dapagliflozin propanediol (FARXIGA) 5 MG TABS tablet Take 1 tablet (5 mg total) by mouth daily before breakfast.  ? Fluticasone-Umeclidin-Vilant (TRELEGY ELLIPTA) 200-62.5-25 MCG/ACT AEPB 1 puff Inhalation Once a day  ? glipiZIDE (GLUCOTROL) 10 MG tablet 1 tablet 30 minutes before breakfast Orally Once a day  ? ibuprofen (ADVIL) 200 MG tablet Take 400 mg by mouth every 6 (six) hours as needed for moderate pain.  ? ketoconazole (NIZORAL) 2 % cream Apply bid to rash  ? levothyroxine (SYNTHROID) 50 MCG tablet Take 1 tablet (50 mcg total) by mouth daily.  ? lisinopril (ZESTRIL) 5 MG tablet TAKE (1) TABLET DAILY FOR HIGH BLOOD PRESSURE.  ? metFORMIN (GLUCOPHAGE)  1000 MG tablet Take 1 tablet (1,000 mg total) by mouth 2 (two) times daily with a meal.  ? mometasone (ELOCON) 0.1 % cream Apply 1 application topically daily. (Patient taking differently: Apply 1 application. topically daily as needed (rash).)  ? montelukast (SINGULAIR) 10 MG tablet TAKE 1 TABLET DAILY  ? pantoprazole (PROTONIX) 40 MG tablet Take 1 tablet (40 mg total) by mouth daily.  ? phenylephrine (SUDAFED PE) 10 MG TABS tablet Take 10 mg by mouth every 4 (four) hours as needed (sinus congestion).  ? rosuvastatin (CRESTOR) 20 MG tablet TAKE 1 TABLET DAILY  ? triamcinolone cream (KENALOG) 0.1 % Apply 1 application topically 2 (two) times daily as needed (dry  skin).  ? ULTICARE SHORT PEN NEEDLES 31G X 8 MM MISC USE AS DIRECTED  ? vitamin B-12 (CYANOCOBALAMIN) 1000 MCG tablet Take 1,000 mcg by mouth daily.  ? zaleplon (SONATA) 5 MG capsule Take 1 capsule (5 mg total) by mouth at bedtime as needed for sleep.  ? ondansetron (ZOFRAN-ODT) 4 MG disintegrating tablet Take 1 tablet (4 mg total) by mouth every 8 (eight) hours as needed for nausea or vomiting. (Patient not taking: Reported on 06/06/2021)  ? ?No current facility-administered medications for this visit. (Other)  ? ?REVIEW OF SYSTEMS: ?ROS   ?Positive for: Endocrine, Eyes, Psychiatric ?Negative for: Constitutional, Gastrointestinal, Neurological, Skin, Genitourinary, Musculoskeletal, HENT, Cardiovascular, Respiratory, Allergic/Imm, Heme/Lymph ?Last edited by Kingsley Spittle, COT on 06/06/2021  8:43 AM.  ?  ? ?ALLERGIES ?Allergies  ?Allergen Reactions  ? Ozempic (0.25 Or 0.5 Mg-Dose) [Semaglutide(0.25 Or 0.5mg -Dos)] Nausea And Vomiting  ?  N/v, no energy  ? ? ?PAST MEDICAL HISTORY ?Past Medical History:  ?Diagnosis Date  ? Allergy   ? Asthma   ? Cancer [facility])   ? cervical  ? Complication of anesthesia   ? " I woke and was short of breath."  ? CTS (carpal tunnel syndrome)   ? Diabetes mellitus without complication ([place])   ? Early cataracts, bilateral   ? Fracture   ? trimalleolar ankle left  ? GERD (gastroesophageal reflux disease)   ? Glaucoma   ? Glaucoma   ? History of kidney stones   ? " I passed it"  ? Hyperlipidemia   ? Hypertension   ? Hypothyroidism   ? Neuropathy   ? Vitamin D deficiency   ? Wears glasses   ? ?Past Surgical History:  ?Procedure Laterality Date  ? ABDOMINAL HYSTERECTOMY    ? APPENDECTOMY    ? CARDIAC CATHETERIZATION  2006  ? negative  ? COLONOSCOPY  2009  ? negative  ? INNER EAR SURGERY Right 1981  ? left shoulder surgery Left 10/2009  ? ORIF ANKLE FRACTURE Left 09/05/2018  ? Procedure: OPEN REDUCTION INTERNAL FIXATION TRIMALLEOLAR FRACTURE POSSIBLE SYNDEMOSIS;  Surgeon: Erle Crocker,  MD;  Location: [place];  Service: Orthopedics;  Laterality: Left;  ? TONSILLECTOMY    ? TUBAL LIGATION    ? ?FAMILY HISTORY ?Family History  ?Problem Relation Age of Onset  ? Diabetes Mother   ? Prostate cancer Father   ? Heart disease Father   ? Heart attack Other   ? ? ?SOCIAL HISTORY ?Social History  ? ?Tobacco Use  ? Smoking status: Former  ?  Packs/day: 1.00  ?  Years: 15.00  ?  Pack years: 15.00  ?  Types: Cigarettes  ?  Quit date: 06/05/2001  ?  Years since quitting: 20.0  ? Smokeless tobacco: Never  ?Vaping Use  ? Vaping  Use: Never used  ?Substance Use Topics  ? Alcohol use: No  ? Drug use: No  ?  ? ?  ?OPHTHALMIC EXAM: ? ?Base Eye Exam   ? ? Visual Acuity (Snellen - Linear)   ? ?   Right Left  ? Dist cc 20/25 -1 20/20 -1  ? Dist ph cc 20/25 +2   ? ? Correction: Glasses  ? ?  ?  ? ? Tonometry (Tonopen, 8:51 AM)   ? ?   Right Left  ? Pressure 9 6  ? ?  ?  ? ? Pupils   ? ?   Dark Light Shape React APD  ? Right 4 3 Round Brisk None  ? Left 4 3 Round Brisk None  ? ?  ?  ? ? Visual Fields (Counting fingers)   ? ?   Left Right  ?  Full Full  ? ?  ?  ? ? Extraocular Movement   ? ?   Right Left  ?  Full, Ortho Full, Ortho  ? ?  ?  ? ? Neuro/Psych   ? ? Oriented x3: Yes  ? Mood/Affect: Normal  ? ?  ?  ? ? Dilation   ? ? Both eyes: 1.0% Mydriacyl, 2.5% Phenylephrine @ 8:52 AM  ? ?  ?  ? ?  ? ?Slit Lamp and Fundus Exam   ? ? Slit Lamp Exam   ? ?   Right Left  ? Lids/Lashes Dermatochalasis - upper lid, Meibomian gland dysfunction, Telangiectasia Dermatochalasis - upper lid, Meibomian gland dysfunction, Telangiectasia  ? Conjunctiva/Sclera White and quiet White and quiet  ? Cornea 1-2+ Punctate epithelial erosions, arcus, well healed temporal cataract wounds, tear film debris 1-2+ inferior Punctate epithelial erosions, arcus, well healed temporal cataract wounds  ? Anterior Chamber Deep and quiet Deep and quiet  ? Iris Round and dilated, No NVI Round and dilated, No NVI  ? Lens PC IOL in good position, 1+ Posterior capsular  opacification PC IOL in good position, trace Posterior capsular opacification  ? Anterior Vitreous mild Vitreous syneresis Vitreous syneresis  ? ?  ?  ? ? Fundus Exam   ? ?   Right Left  ? Disc Mild Pall

## 2021-06-06 NOTE — Progress Notes (Signed)
?[facility] Note ? ?06/06/2021 ? ?  ? ?CHIEF COMPLAINT ?Patient presents for Retina Follow Up ? ? ? ?HISTORY OF PRESENT ILLNESS: ?Maria [name] is a 66 y.o. female who presents to the clinic today for:  ? ?HPI   ? ? Retina Follow Up   ?Patient presents with  Diabetic Retinopathy.  In right eye.  Severity is moderate.  Duration of 3 months.  Since onset it is stable.  I, the attending physician,  performed the HPI with the patient and updated documentation appropriately. ? ?  ?  ? ? Comments   ?Pt here for 3 mo ret f/u for NPDR OD. Pt states VA is good, stable. No issues. Most recent A1C done was 8.1. Pt just began new regimen of Farxiga, Metformin and Glipizide. BS yesterday morning was 134.  ? ?  ?  ?Last edited by Bernarda Caffey, MD on 06/06/2021  1:19 PM.  ?  ? ?Pt states she was put on Ozempic, which made her sick, she states she was taken off of it and put on Farxiga, but it is not helping to get her A1c down, she is also taking glipizide and metformin, her a1c is 8.1 ? ?Referring physician: ?Monna Fam, MD ?[place], [place] [address] ? ?HISTORICAL INFORMATION:  ? ?Selected notes from the [facility] ?Referred by Dr. Herbert Deaner ?LEE: 01/28/2020 ?BCVA: 20/20 OU ?NPDR OU, PCIOL OU, possible CME OS, POAG OU ?  ? ?CURRENT MEDICATIONS: ?Current Outpatient Medications (Ophthalmic Drugs)  ?Medication Sig  ? bimatoprost (LUMIGAN) 0.01 % SOLN Place 1 drop into both eyes at bedtime.  ? LUMIGAN 0.01 % SOLN Place 1 drop into both eyes at bedtime.   ? ketorolac (ACULAR) 0.4 % SOLN Apply to eye. (Patient not taking: Reported on 06/06/2021)  ? ?No current facility-administered medications for this visit. (Ophthalmic Drugs)  ? ?Current Outpatient Medications (Other)  ?Medication Sig  ? acetaminophen (TYLENOL) 500 MG tablet Take 1,000 mg by mouth every 6 (six) hours as needed for moderate pain.  ? acyclovir cream (ZOVIRAX) 5 % 1 application  ? albuterol (VENTOLIN HFA) 108  (90 Base) MCG/ACT inhaler Inhale 2 puffs into the lungs every 6 (six) hours as needed for wheezi ng orshortness of breath.  ? cetirizine (ZYRTEC) 10 MG tablet 1 tablet  ? cetirizine (ZYRTEC) 10 MG tablet 1 tablet Orally Once a day for 30 day(s)  ? Cholecalciferol (VITAMIN D3) 50 MCG (2000 UT) capsule 1 tablet  ? Cholecalciferol 50 MCG (2000 UT) CAPS 1 tablet Orally Once a day  ? Continuous Blood Gluc Receiver (FREESTYLE LIBRE 2 READER) DEVI Use to test blood sugar continuously. DX: E11.65  ? Continuous Blood Gluc Sensor (FREESTYLE LIBRE 2 SENSOR) MISC Use to test blood sugar continuously. DX: E11.65  ? dapagliflozin propanediol (FARXIGA) 5 MG TABS tablet Take 1 tablet (5 mg total) by mouth daily before breakfast.  ? Fluticasone-Umeclidin-Vilant (TRELEGY ELLIPTA) 200-62.5-25 MCG/ACT AEPB 1 puff Inhalation Once a day  ? glipiZIDE (GLUCOTROL) 10 MG tablet 1 tablet 30 minutes before breakfast Orally Once a day  ? ibuprofen (ADVIL) 200 MG tablet Take 400 mg by mouth every 6 (six) hours as needed for moderate pain.  ? ketoconazole (NIZORAL) 2 % cream Apply bid to rash  ? levothyroxine (SYNTHROID) 50 MCG tablet Take 1 tablet (50 mcg total) by mouth daily.  ? lisinopril (ZESTRIL) 5 MG tablet TAKE (1) TABLET DAILY FOR HIGH BLOOD PRESSURE.  ? metFORMIN (GLUCOPHAGE)  1000 MG tablet Take 1 tablet (1,000 mg total) by mouth 2 (two) times daily with a meal.  ? mometasone (ELOCON) 0.1 % cream Apply 1 application topically daily. (Patient taking differently: Apply 1 application. topically daily as needed (rash).)  ? montelukast (SINGULAIR) 10 MG tablet TAKE 1 TABLET DAILY  ? pantoprazole (PROTONIX) 40 MG tablet Take 1 tablet (40 mg total) by mouth daily.  ? phenylephrine (SUDAFED PE) 10 MG TABS tablet Take 10 mg by mouth every 4 (four) hours as needed (sinus congestion).  ? rosuvastatin (CRESTOR) 20 MG tablet TAKE 1 TABLET DAILY  ? triamcinolone cream (KENALOG) 0.1 % Apply 1 application topically 2 (two) times daily as needed (dry  skin).  ? ULTICARE SHORT PEN NEEDLES 31G X 8 MM MISC USE AS DIRECTED  ? vitamin B-12 (CYANOCOBALAMIN) 1000 MCG tablet Take 1,000 mcg by mouth daily.  ? zaleplon (SONATA) 5 MG capsule Take 1 capsule (5 mg total) by mouth at bedtime as needed for sleep.  ? ondansetron (ZOFRAN-ODT) 4 MG disintegrating tablet Take 1 tablet (4 mg total) by mouth every 8 (eight) hours as needed for nausea or vomiting. (Patient not taking: Reported on 06/06/2021)  ? ?No current facility-administered medications for this visit. (Other)  ? ?REVIEW OF SYSTEMS: ?ROS   ?Positive for: Endocrine, Eyes, Psychiatric ?Negative for: Constitutional, Gastrointestinal, Neurological, Skin, Genitourinary, Musculoskeletal, HENT, Cardiovascular, Respiratory, Allergic/Imm, Heme/Lymph ?Last edited by Kingsley Spittle, COT on 06/06/2021  8:43 AM.  ?  ? ?ALLERGIES ?Allergies  ?Allergen Reactions  ? Ozempic (0.25 Or 0.5 Mg-Dose) [Semaglutide(0.25 Or 0.5mg -Dos)] Nausea And Vomiting  ?  N/v, no energy  ? ? ?PAST MEDICAL HISTORY ?Past Medical History:  ?Diagnosis Date  ? Allergy   ? Asthma   ? Cancer [facility])   ? cervical  ? Complication of anesthesia   ? " I woke and was short of breath."  ? CTS (carpal tunnel syndrome)   ? Diabetes mellitus without complication ([place])   ? Early cataracts, bilateral   ? Fracture   ? trimalleolar ankle left  ? GERD (gastroesophageal reflux disease)   ? Glaucoma   ? Glaucoma   ? History of kidney stones   ? " I passed it"  ? Hyperlipidemia   ? Hypertension   ? Hypothyroidism   ? Neuropathy   ? Vitamin D deficiency   ? Wears glasses   ? ?Past Surgical History:  ?Procedure Laterality Date  ? ABDOMINAL HYSTERECTOMY    ? APPENDECTOMY    ? CARDIAC CATHETERIZATION  2006  ? negative  ? COLONOSCOPY  2009  ? negative  ? INNER EAR SURGERY Right 1981  ? left shoulder surgery Left 10/2009  ? ORIF ANKLE FRACTURE Left 09/05/2018  ? Procedure: OPEN REDUCTION INTERNAL FIXATION TRIMALLEOLAR FRACTURE POSSIBLE SYNDEMOSIS;  Surgeon: Erle Crocker,  MD;  Location: [place];  Service: Orthopedics;  Laterality: Left;  ? TONSILLECTOMY    ? TUBAL LIGATION    ? ?FAMILY HISTORY ?Family History  ?Problem Relation Age of Onset  ? Diabetes Mother   ? Prostate cancer Father   ? Heart disease Father   ? Heart attack Other   ? ? ?SOCIAL HISTORY ?Social History  ? ?Tobacco Use  ? Smoking status: Former  ?  Packs/day: 1.00  ?  Years: 15.00  ?  Pack years: 15.00  ?  Types: Cigarettes  ?  Quit date: 06/05/2001  ?  Years since quitting: 20.0  ? Smokeless tobacco: Never  ?Vaping Use  ? Vaping  Use: Never used  ?Substance Use Topics  ? Alcohol use: No  ? Drug use: No  ?  ? ?  ?OPHTHALMIC EXAM: ? ?Base Eye Exam   ? ? Visual Acuity (Snellen - Linear)   ? ?   Right Left  ? Dist cc 20/25 -1 20/20 -1  ? Dist ph cc 20/25 +2   ? ? Correction: Glasses  ? ?  ?  ? ? Tonometry (Tonopen, 8:51 AM)   ? ?   Right Left  ? Pressure 9 6  ? ?  ?  ? ? Pupils   ? ?   Dark Light Shape React APD  ? Right 4 3 Round Brisk None  ? Left 4 3 Round Brisk None  ? ?  ?  ? ? Visual Fields (Counting fingers)   ? ?   Left Right  ?  Full Full  ? ?  ?  ? ? Extraocular Movement   ? ?   Right Left  ?  Full, Ortho Full, Ortho  ? ?  ?  ? ? Neuro/Psych   ? ? Oriented x3: Yes  ? Mood/Affect: Normal  ? ?  ?  ? ? Dilation   ? ? Both eyes: 1.0% Mydriacyl, 2.5% Phenylephrine @ 8:52 AM  ? ?  ?  ? ?  ? ?Slit Lamp and Fundus Exam   ? ? Slit Lamp Exam   ? ?   Right Left  ? Lids/Lashes Dermatochalasis - upper lid, Meibomian gland dysfunction, Telangiectasia Dermatochalasis - upper lid, Meibomian gland dysfunction, Telangiectasia  ? Conjunctiva/Sclera White and quiet White and quiet  ? Cornea 1-2+ Punctate epithelial erosions, arcus, well healed temporal cataract wounds, tear film debris 1-2+ inferior Punctate epithelial erosions, arcus, well healed temporal cataract wounds  ? Anterior Chamber Deep and quiet Deep and quiet  ? Iris Round and dilated, No NVI Round and dilated, No NVI  ? Lens PC IOL in good position, 1+ Posterior capsular  opacification PC IOL in good position, trace Posterior capsular opacification  ? Anterior Vitreous mild Vitreous syneresis Vitreous syneresis  ? ?  ?  ? ? Fundus Exam   ? ?   Right Left  ? Disc Mild Pall

## 2021-06-29 ENCOUNTER — Other Ambulatory Visit: Payer: Self-pay | Admitting: Family Medicine

## 2021-06-29 ENCOUNTER — Ambulatory Visit: Payer: Medicare Other | Admitting: Family Medicine

## 2021-06-29 ENCOUNTER — Ambulatory Visit (INDEPENDENT_AMBULATORY_CARE_PROVIDER_SITE_OTHER): Payer: Medicare Other

## 2021-06-29 ENCOUNTER — Ambulatory Visit (INDEPENDENT_AMBULATORY_CARE_PROVIDER_SITE_OTHER): Payer: Medicare Other | Admitting: Family Medicine

## 2021-06-29 ENCOUNTER — Encounter: Payer: Self-pay | Admitting: Family Medicine

## 2021-06-29 ENCOUNTER — Telehealth: Payer: Self-pay

## 2021-06-29 VITALS — BP 91/54 | HR 93 | Temp 97.4°F | Resp 20 | Ht 62.0 in | Wt 132.0 lb

## 2021-06-29 DIAGNOSIS — R5383 Other fatigue: Secondary | ICD-10-CM

## 2021-06-29 DIAGNOSIS — R6889 Other general symptoms and signs: Secondary | ICD-10-CM | POA: Diagnosis not present

## 2021-06-29 DIAGNOSIS — E1169 Type 2 diabetes mellitus with other specified complication: Secondary | ICD-10-CM | POA: Diagnosis not present

## 2021-06-29 DIAGNOSIS — Z13820 Encounter for screening for osteoporosis: Secondary | ICD-10-CM

## 2021-06-29 DIAGNOSIS — E039 Hypothyroidism, unspecified: Secondary | ICD-10-CM | POA: Diagnosis not present

## 2021-06-29 DIAGNOSIS — I152 Hypertension secondary to endocrine disorders: Secondary | ICD-10-CM

## 2021-06-29 DIAGNOSIS — Z23 Encounter for immunization: Secondary | ICD-10-CM

## 2021-06-29 DIAGNOSIS — K219 Gastro-esophageal reflux disease without esophagitis: Secondary | ICD-10-CM

## 2021-06-29 DIAGNOSIS — I739 Peripheral vascular disease, unspecified: Secondary | ICD-10-CM | POA: Diagnosis not present

## 2021-06-29 DIAGNOSIS — R5381 Other malaise: Secondary | ICD-10-CM

## 2021-06-29 DIAGNOSIS — Z79899 Other long term (current) drug therapy: Secondary | ICD-10-CM

## 2021-06-29 DIAGNOSIS — I10 Essential (primary) hypertension: Secondary | ICD-10-CM | POA: Diagnosis not present

## 2021-06-29 DIAGNOSIS — Z780 Asymptomatic menopausal state: Secondary | ICD-10-CM

## 2021-06-29 DIAGNOSIS — Z794 Long term (current) use of insulin: Secondary | ICD-10-CM | POA: Diagnosis not present

## 2021-06-29 DIAGNOSIS — E1159 Type 2 diabetes mellitus with other circulatory complications: Secondary | ICD-10-CM

## 2021-06-29 DIAGNOSIS — J4540 Moderate persistent asthma, uncomplicated: Secondary | ICD-10-CM | POA: Diagnosis not present

## 2021-06-29 DIAGNOSIS — E785 Hyperlipidemia, unspecified: Secondary | ICD-10-CM

## 2021-06-29 DIAGNOSIS — F5101 Primary insomnia: Secondary | ICD-10-CM | POA: Diagnosis not present

## 2021-06-29 DIAGNOSIS — M810 Age-related osteoporosis without current pathological fracture: Secondary | ICD-10-CM | POA: Diagnosis not present

## 2021-06-29 DIAGNOSIS — Z1231 Encounter for screening mammogram for malignant neoplasm of breast: Secondary | ICD-10-CM

## 2021-06-29 LAB — BAYER DCA HB A1C WAIVED: HB A1C (BAYER DCA - WAIVED): 7.3 % — ABNORMAL HIGH (ref 4.8–5.6)

## 2021-06-29 MED ORDER — PANTOPRAZOLE SODIUM 40 MG PO TBEC: 40.0000 mg | DELAYED_RELEASE_TABLET | Freq: Every day | ORAL | 3 refills | Status: DC

## 2021-06-29 MED ORDER — ZALEPLON 5 MG PO CAPS: 5.0000 mg | ORAL_CAPSULE | Freq: Every evening | ORAL | 0 refills | Status: DC | PRN

## 2021-06-29 MED ORDER — GLIPIZIDE ER 10 MG PO TB24: 10.0000 mg | ORAL_TABLET | Freq: Every day | ORAL | 3 refills | Status: DC

## 2021-06-29 MED ORDER — GLIPIZIDE 10 MG PO TABS: ORAL_TABLET | ORAL | 3 refills | Status: DC

## 2021-06-29 MED ORDER — MONTELUKAST SODIUM 10 MG PO TABS: 10.0000 mg | ORAL_TABLET | Freq: Every day | ORAL | 3 refills | Status: DC

## 2021-06-29 MED ORDER — ROSUVASTATIN CALCIUM 20 MG PO TABS: 20.0000 mg | ORAL_TABLET | Freq: Every day | ORAL | 3 refills | Status: DC

## 2021-06-29 NOTE — Progress Notes (Signed)
?  ? ?Subjective:  ?Patient ID: Maria [name], female    DOB: [birth year], 66 y.o.   MRN: [id number] ? ?Patient Care Team: ?Baruch Gouty, FNP as PCP - General (Family Medicine) ?Lavera Guise, [facility] as Pharmacist (Family Medicine)  ? ?Chief Complaint:  Medical Management of Chronic Issues ? ? ?HPI: ?Maria [name] is a 66 y.o. female presenting on 06/29/2021 for Medical Management of Chronic Issues ? ? ?1. Type 2 diabetes mellitus with other specified complication, with long-term current use of insulin ([place]) ?Compliant with medications and diet for the most part. She is taking farxiga, metformin, and glipizide without associated side effects. She denies polyuria, polyphagia, and polydipsia.  ? ?2. Hyperlipidemia associated with type 2 diabetes mellitus ([place]) ?On Crestor and tolerating well. No significant myalgias. Does try to watch diet. Does not exercise.  ? ?3. Acquired hypothyroidism ?Complaint with repletion therapy and tolerating well. Denies hypo- or hyperthyroid symptoms.  ? ?4. Hypertension associated with type 2 diabetes ([place]) ?Has been compliant with medications and tolerating well. No chest pain, palpitations, dizziness, syncope, headaches, orthopnea, or PND. No leg swelling.  ? ?5. Gastroesophageal reflux disease without esophagitis ?On PPI therapy with great control of symptoms. No dysphagia, hemoptysis, voice change, cough, melena, abdominal pain, or hematochezia.  ? ?6. Screening for osteoporosis ?Has not had DEXA to date and is postmenopausal and on PPI therapy. Will order today ? ?7. Intermittent claudication (HCC) ?Patient reports intermittent pain and heaviness in bilateral lower extremities, worse in right than left.  Pain is relieved by rest.  She denies prior work-up for symptoms.  No discoloration or swelling. ? ?8. Malaise and fatigue ?Ongoing malaise and fatigue despite multivitamin use on a daily basis.  She has been on metformin for a long time.  No recent vitamin B12 or vitamin D.  We  will check along with thyroid function. ? ?9. Primary insomnia ?Was started on Sonata at last visit to see if beneficial.  She states the medication will help her go to sleep but not stay asleep.  She has not taken melatonin with the Sonata. ?  ? ? ?Relevant past medical, surgical, family, and social history reviewed and updated as indicated.  ?Allergies and medications reviewed and updated. Data reviewed: Chart in Epic. ? ? ?Past Medical History:  ?Diagnosis Date  ? Allergy   ? Asthma   ? Cancer [facility])   ? cervical  ? Complication of anesthesia   ? " I woke and was short of breath."  ? CTS (carpal tunnel syndrome)   ? Diabetes mellitus without complication ([place])   ? Early cataracts, bilateral   ? Fracture   ? trimalleolar ankle left  ? GERD (gastroesophageal reflux disease)   ? Glaucoma   ? Glaucoma   ? History of kidney stones   ? " I passed it"  ? Hyperlipidemia   ? Hypertension   ? Hypothyroidism   ? Neuropathy   ? Vitamin D deficiency   ? Wears glasses   ? ? ?Past Surgical History:  ?Procedure Laterality Date  ? ABDOMINAL HYSTERECTOMY    ? APPENDECTOMY    ? CARDIAC CATHETERIZATION  2006  ? negative  ? COLONOSCOPY  2009  ? negative  ? INNER EAR SURGERY Right 1981  ? left shoulder surgery Left 10/2009  ? ORIF ANKLE FRACTURE Left 09/05/2018  ? Procedure: OPEN REDUCTION INTERNAL FIXATION TRIMALLEOLAR FRACTURE POSSIBLE SYNDEMOSIS;  Surgeon: Erle Crocker, MD;  Location: [place];  Service: Orthopedics;  Laterality: Left;  ? TONSILLECTOMY    ? TUBAL LIGATION    ? ? ?Social History  ? ?Socioeconomic History  ? Marital status: Married  ?  Spouse name: Elta Guadeloupe  ? Number of children: 2  ? Years of education: Not on file  ? Highest education level: Not on file  ?Occupational History  ? Not on file  ?Tobacco Use  ? Smoking status: Former  ?  Packs/day: 1.00  ?  Years: 15.00  ?  Pack years: 15.00  ?  Types: Cigarettes  ?  Quit date: 06/05/2001  ?  Years since quitting: 20.0  ? Smokeless tobacco: Never  ?Vaping Use  ? Vaping  Use: Never used  ?Substance and Sexual Activity  ? Alcohol use: No  ? Drug use: No  ? Sexual activity: Not Currently  ?Other Topics Concern  ? Not on file  ?Social History Narrative  ? 2 sons.  ? 3 grandchildren.  ? ?Social Determinants of Health  ? ?Financial Resource Strain: Low Risk   ? Difficulty of Paying Living Expenses: Not hard at all  ?Food Insecurity: No Food Insecurity  ? Worried About Charity fundraiser in the Last Year: Never true  ? Ran Out of Food in the Last Year: Never true  ?Transportation Needs: No Transportation Needs  ? Lack of Transportation (Medical): No  ? Lack of Transportation (Non-Medical): No  ?Physical Activity: Insufficiently Active  ? Days of Exercise per Week: 3 days  ? Minutes of Exercise per Session: 30 min  ?Stress: No Stress Concern Present  ? Feeling of Stress : Not at all  ?Social Connections: Socially Integrated  ? Frequency of Communication with Friends and Family: More than three times a week  ? Frequency of Social Gatherings with Friends and Family: More than three times a week  ? Attends Religious Services: 1 to 4 times per year  ? Active Member of Clubs or Organizations: Yes  ? Attends Archivist Meetings: 1 to 4 times per year  ? Marital Status: Married  ?Intimate Partner Violence: Not At Risk  ? Fear of Current or Ex-Partner: No  ? Emotionally Abused: No  ? Physically Abused: No  ? Sexually Abused: No  ? ? ?Outpatient Encounter Medications as of 06/29/2021  ?Medication Sig  ? acetaminophen (TYLENOL) 500 MG tablet Take 1,000 mg by mouth every 6 (six) hours as needed for moderate pain.  ? acyclovir cream (ZOVIRAX) 5 % 1 application  ? albuterol (VENTOLIN HFA) 108 (90 Base) MCG/ACT inhaler Inhale 2 puffs into the lungs every 6 (six) hours as needed for wheezi ng orshortness of breath.  ? bimatoprost (LUMIGAN) 0.01 % SOLN Place 1 drop into both eyes at bedtime.  ? cetirizine (ZYRTEC) 10 MG tablet 1 tablet Orally Once a day for 30 day(s)  ? Cholecalciferol (VITAMIN  D3) 50 MCG (2000 UT) capsule 1 tablet  ? Continuous Blood Gluc Receiver (FREESTYLE LIBRE 2 READER) DEVI Use to test blood sugar continuously. DX: E11.65  ? Continuous Blood Gluc Sensor (FREESTYLE LIBRE 2 SENSOR) MISC Use to test blood sugar continuously. DX: E11.65  ? dapagliflozin propanediol (FARXIGA) 5 MG TABS tablet Take 1 tablet (5 mg total) by mouth daily before breakfast.  ? Fluticasone-Umeclidin-Vilant (TRELEGY ELLIPTA) 200-62.5-25 MCG/ACT AEPB 1 puff Inhalation Once a day  ? glipiZIDE (GLUCOTROL XL) 10 MG 24 hr tablet Take 1 tablet (10 mg total) by mouth daily with breakfast.  ? ibuprofen (ADVIL) 200 MG tablet Take 400 mg by mouth  every 6 (six) hours as needed for moderate pain.  ? ketoconazole (NIZORAL) 2 % cream Apply bid to rash  ? ketorolac (ACULAR) 0.4 % SOLN Apply to eye.  ? levothyroxine (SYNTHROID) 50 MCG tablet Take 1 tablet (50 mcg total) by mouth daily.  ? lisinopril (ZESTRIL) 5 MG tablet TAKE (1) TABLET DAILY FOR HIGH BLOOD PRESSURE.  ? LUMIGAN 0.01 % SOLN Place 1 drop into both eyes at bedtime.   ? metFORMIN (GLUCOPHAGE) 1000 MG tablet Take 1 tablet (1,000 mg total) by mouth 2 (two) times daily with a meal.  ? mometasone (ELOCON) 0.1 % cream Apply 1 application topically daily. (Patient taking differently: Apply 1 application. topically daily as needed (rash).)  ? phenylephrine (SUDAFED PE) 10 MG TABS tablet Take 10 mg by mouth every 4 (four) hours as needed (sinus congestion).  ? triamcinolone cream (KENALOG) 0.1 % Apply 1 application topically 2 (two) times daily as needed (dry skin).  ? vitamin B-12 (CYANOCOBALAMIN) 1000 MCG tablet Take 1,000 mcg by mouth daily.  ? zaleplon (SONATA) 5 MG capsule Take 1 capsule (5 mg total) by mouth at bedtime as needed for sleep.  ? [DISCONTINUED] glipiZIDE (GLUCOTROL) 10 MG tablet 1 tablet 30 minutes before breakfast Orally Once a day  ? [DISCONTINUED] montelukast (SINGULAIR) 10 MG tablet TAKE 1 TABLET DAILY  ? [DISCONTINUED] pantoprazole (PROTONIX) 40 MG  tablet Take 1 tablet (40 mg total) by mouth daily.  ? [DISCONTINUED] rosuvastatin (CRESTOR) 20 MG tablet TAKE 1 TABLET DAILY  ? montelukast (SINGULAIR) 10 MG tablet Take 1 tablet (10 mg total) by mouth da

## 2021-06-29 NOTE — Patient Instructions (Addendum)

## 2021-06-29 NOTE — Telephone Encounter (Signed)
Maria [name] would like patient to start Prolia ?

## 2021-06-30 LAB — THYROID PANEL WITH TSH
Free Thyroxine Index: 2.3 (ref 1.2–4.9)
T3 Uptake Ratio: 29 % (ref 24–39)
T4, Total: 8 ug/dL (ref 4.5–12.0)
TSH: 6.38 u[IU]/mL — ABNORMAL HIGH (ref 0.450–4.500)

## 2021-06-30 LAB — CBC WITH DIFFERENTIAL/PLATELET
Basophils Absolute: 0 10*3/uL (ref 0.0–0.2)
Basos: 0 %
EOS (ABSOLUTE): 0.2 10*3/uL (ref 0.0–0.4)
Eos: 4 %
Hematocrit: 40 % (ref 34.0–46.6)
Hemoglobin: 13.1 g/dL (ref 11.1–15.9)
Immature Grans (Abs): 0 10*3/uL (ref 0.0–0.1)
Immature Granulocytes: 0 %
Lymphocytes Absolute: 0.8 10*3/uL (ref 0.7–3.1)
Lymphs: 15 %
MCH: 30.5 pg (ref 26.6–33.0)
MCHC: 32.8 g/dL (ref 31.5–35.7)
MCV: 93 fL (ref 79–97)
Monocytes Absolute: 0.6 10*3/uL (ref 0.1–0.9)
Monocytes: 10 %
Neutrophils Absolute: 3.9 10*3/uL (ref 1.4–7.0)
Neutrophils: 71 %
Platelets: 207 10*3/uL (ref 150–450)
RBC: 4.3 x10E6/uL (ref 3.77–5.28)
RDW: 12.9 % (ref 11.7–15.4)
WBC: 5.5 10*3/uL (ref 3.4–10.8)

## 2021-06-30 LAB — CMP14+EGFR
ALT: 20 IU/L (ref 0–32)
AST: 20 IU/L (ref 0–40)
Albumin/Globulin Ratio: 2.1 (ref 1.2–2.2)
Albumin: 4.2 g/dL (ref 3.8–4.8)
Alkaline Phosphatase: 122 IU/L — ABNORMAL HIGH (ref 44–121)
BUN/Creatinine Ratio: 14 (ref 12–28)
BUN: 12 mg/dL (ref 8–27)
Bilirubin Total: 0.7 mg/dL (ref 0.0–1.2)
CO2: 21 mmol/L (ref 20–29)
Calcium: 9.2 mg/dL (ref 8.7–10.3)
Chloride: 104 mmol/L (ref 96–106)
Creatinine, Ser: 0.84 mg/dL (ref 0.57–1.00)
Globulin, Total: 2 g/dL (ref 1.5–4.5)
Glucose: 208 mg/dL — ABNORMAL HIGH (ref 70–99)
Potassium: 4.2 mmol/L (ref 3.5–5.2)
Sodium: 141 mmol/L (ref 134–144)
Total Protein: 6.2 g/dL (ref 6.0–8.5)
eGFR: 77 mL/min/{1.73_m2} (ref >59)

## 2021-06-30 LAB — LIPID PANEL
Chol/HDL Ratio: 3.3 ratio (ref 0.0–4.4)
Cholesterol, Total: 123 mg/dL (ref 100–199)
HDL: 37 mg/dL — ABNORMAL LOW
LDL Chol Calc (NIH): 58 mg/dL (ref 0–99)
Triglycerides: 162 mg/dL — ABNORMAL HIGH (ref 0–149)
VLDL Cholesterol Cal: 28 mg/dL (ref 5–40)

## 2021-07-01 LAB — VITAMIN D 25 HYDROXY (VIT D DEFICIENCY, FRACTURES): Vit D, 25-Hydroxy: 44.4 ng/mL (ref 30.0–100.0)

## 2021-07-01 LAB — VITAMIN B12: Vitamin B-12: 527 pg/mL (ref 232–1245)

## 2021-07-01 LAB — SPECIMEN STATUS REPORT

## 2021-07-01 NOTE — Telephone Encounter (Signed)
Sending benefit verification ?

## 2021-07-04 ENCOUNTER — Other Ambulatory Visit: Payer: Self-pay | Admitting: Nurse Practitioner

## 2021-07-04 MED ORDER — LEVOTHYROXINE SODIUM 75 MCG PO TABS: 75.0000 ug | ORAL_TABLET | Freq: Every day | ORAL | 11 refills | Status: DC

## 2021-07-05 LAB — TOXASSURE SELECT 13 (MW), URINE

## 2021-07-11 ENCOUNTER — Ambulatory Visit
Admission: RE | Admit: 2021-07-11 | Discharge: 2021-07-11 | Disposition: A | Discharge status: Home / Self Care | Payer: Medicare Other | Source: Ambulatory Visit | Attending: Family Medicine | Admitting: Family Medicine

## 2021-07-11 DIAGNOSIS — Z1231 Encounter for screening mammogram for malignant neoplasm of breast: Secondary | ICD-10-CM | POA: Diagnosis not present

## 2021-07-28 ENCOUNTER — Other Ambulatory Visit: Payer: Self-pay | Admitting: Family Medicine

## 2021-07-28 DIAGNOSIS — E1169 Type 2 diabetes mellitus with other specified complication: Secondary | ICD-10-CM

## 2021-08-01 ENCOUNTER — Other Ambulatory Visit: Payer: Self-pay | Admitting: Family Medicine

## 2021-08-03 ENCOUNTER — Ambulatory Visit: Payer: Medicare Other | Admitting: Family Medicine

## 2021-08-03 ENCOUNTER — Encounter: Payer: Self-pay | Admitting: Family Medicine

## 2021-08-03 ENCOUNTER — Ambulatory Visit (INDEPENDENT_AMBULATORY_CARE_PROVIDER_SITE_OTHER): Payer: Medicare Other | Admitting: Family Medicine

## 2021-08-03 VITALS — BP 118/60 | HR 89 | Temp 98.0°F | Ht 62.0 in | Wt 131.0 lb

## 2021-08-03 DIAGNOSIS — E039 Hypothyroidism, unspecified: Secondary | ICD-10-CM

## 2021-08-03 DIAGNOSIS — L659 Nonscarring hair loss, unspecified: Secondary | ICD-10-CM | POA: Diagnosis not present

## 2021-08-03 DIAGNOSIS — J069 Acute upper respiratory infection, unspecified: Secondary | ICD-10-CM

## 2021-08-03 DIAGNOSIS — R6889 Other general symptoms and signs: Secondary | ICD-10-CM | POA: Diagnosis not present

## 2021-08-03 MED ORDER — PREDNISONE 20 MG PO TABS: ORAL_TABLET | ORAL | 0 refills | Status: DC

## 2021-08-03 NOTE — Patient Instructions (Signed)
Hair, Skin, and Nails Vitamins for hair loss ?

## 2021-08-03 NOTE — Progress Notes (Signed)
?  ? ?Subjective:  ?Patient ID: Maria [name], female    DOB: 01-27-1956, 66 y.o.   MRN: [id number] ? ?Patient Care Team: ?Baruch Gouty, FNP as PCP - General (Family Medicine) ?Lavera Guise, [facility] as Pharmacist (Family Medicine)  ? ?Chief Complaint:  Hair/Scalp Problem ? ? ?HPI: ?Maria [name] is a 66 y.o. female presenting on 08/03/2021 for Hair/Scalp Problem ? ? ?Pt presents today with complaints of significant and ongoing hair loss for the past 3 months. States she has loss with washing and brushing. No other associated symptoms. She denies recent illness or COVID-19 infection.  ?She reports rhinorrhea and congestion which started today. She is taking an antihistamine without resolution of symptoms. She has noticed wheezing. No fever, chills, weakness, fatigue, sinus pain, sore throat, or otalgia.  ? ? ? ?Relevant past medical, surgical, family, and social history reviewed and updated as indicated.  ?Allergies and medications reviewed and updated. Data reviewed: Chart in Epic. ? ? ?Past Medical History:  ?Diagnosis Date  ? Allergy   ? Asthma   ? Cancer [facility])   ? cervical  ? Complication of anesthesia   ? " I woke and was short of breath."  ? CTS (carpal tunnel syndrome)   ? Diabetes mellitus without complication ([place])   ? Early cataracts, bilateral   ? Fracture   ? trimalleolar ankle left  ? GERD (gastroesophageal reflux disease)   ? Glaucoma   ? Glaucoma   ? History of kidney stones   ? " I passed it"  ? Hyperlipidemia   ? Hypertension   ? Hypothyroidism   ? Neuropathy   ? Vitamin D deficiency   ? Wears glasses   ? ? ?Past Surgical History:  ?Procedure Laterality Date  ? ABDOMINAL HYSTERECTOMY    ? APPENDECTOMY    ? CARDIAC CATHETERIZATION  2006  ? negative  ? COLONOSCOPY  2009  ? negative  ? INNER EAR SURGERY Right 1981  ? left shoulder surgery Left 10/2009  ? ORIF ANKLE FRACTURE Left 09/05/2018  ? Procedure: OPEN REDUCTION INTERNAL FIXATION TRIMALLEOLAR FRACTURE POSSIBLE SYNDEMOSIS;  Surgeon: Erle Crocker, MD;  Location: [place];  Service: Orthopedics;  Laterality: Left;  ? TONSILLECTOMY    ? TUBAL LIGATION    ? ? ?Social History  ? ?Socioeconomic History  ? Marital status: Married  ?  Spouse name: Elta Guadeloupe  ? Number of children: 2  ? Years of education: Not on file  ? Highest education level: Not on file  ?Occupational History  ? Not on file  ?Tobacco Use  ? Smoking status: Former  ?  Packs/day: 1.00  ?  Years: 15.00  ?  Pack years: 15.00  ?  Types: Cigarettes  ?  Quit date: 06/05/2001  ?  Years since quitting: 20.1  ? Smokeless tobacco: Never  ?Vaping Use  ? Vaping Use: Never used  ?Substance and Sexual Activity  ? Alcohol use: No  ? Drug use: No  ? Sexual activity: Not Currently  ?Other Topics Concern  ? Not on file  ?Social History Narrative  ? 2 sons.  ? 3 grandchildren.  ? ?Social Determinants of Health  ? ?Financial Resource Strain: Low Risk   ? Difficulty of Paying Living Expenses: Not hard at all  ?Food Insecurity: No Food Insecurity  ? Worried About Charity fundraiser in the Last Year: Never true  ? Ran Out of Food in the Last Year: Never true  ?Transportation Needs: No Transportation  Needs  ? Lack of Transportation (Medical): No  ? Lack of Transportation (Non-Medical): No  ?Physical Activity: Insufficiently Active  ? Days of Exercise per Week: 3 days  ? Minutes of Exercise per Session: 30 min  ?Stress: No Stress Concern Present  ? Feeling of Stress : Not at all  ?Social Connections: Socially Integrated  ? Frequency of Communication with Friends and Family: More than three times a week  ? Frequency of Social Gatherings with Friends and Family: More than three times a week  ? Attends Religious Services: 1 to 4 times per year  ? Active Member of Clubs or Organizations: Yes  ? Attends Archivist Meetings: 1 to 4 times per year  ? Marital Status: Married  ?Intimate Partner Violence: Not At Risk  ? Fear of Current or Ex-Partner: No  ? Emotionally Abused: No  ? Physically Abused: No  ? Sexually  Abused: No  ? ? ?Outpatient Encounter Medications as of 08/03/2021  ?Medication Sig  ? acetaminophen (TYLENOL) 500 MG tablet Take 1,000 mg by mouth every 6 (six) hours as needed for moderate pain.  ? albuterol (VENTOLIN HFA) 108 (90 Base) MCG/ACT inhaler Inhale 2 puffs into the lungs every 6 (six) hours as needed for wheezi ng orshortness of breath.  ? bimatoprost (LUMIGAN) 0.01 % SOLN Place 1 drop into both eyes at bedtime.  ? cetirizine (ZYRTEC) 10 MG tablet 1 tablet Orally Once a day for 30 day(s)  ? Cholecalciferol (VITAMIN D3) 50 MCG (2000 UT) capsule 1 tablet  ? Continuous Blood Gluc Receiver (FREESTYLE LIBRE 2 READER) DEVI Use to test blood sugar continuously. DX: E11.65  ? Continuous Blood Gluc Sensor (FREESTYLE LIBRE 2 SENSOR) MISC Use to test blood sugar continuously. DX: E11.65  ? FARXIGA 5 MG TABS tablet TAKE 1 TABLET DAILY BEFORE BREAKFAST  ? Fluticasone-Umeclidin-Vilant (TRELEGY ELLIPTA) 200-62.5-25 MCG/ACT AEPB 1 puff Inhalation Once a day  ? glipiZIDE (GLUCOTROL XL) 10 MG 24 hr tablet Take 1 tablet (10 mg total) by mouth daily with breakfast.  ? ibuprofen (ADVIL) 200 MG tablet Take 400 mg by mouth every 6 (six) hours as needed for moderate pain.  ? levothyroxine (SYNTHROID) 75 MCG tablet Take 1 tablet (75 mcg total) by mouth daily.  ? lisinopril (ZESTRIL) 5 MG tablet TAKE (1) TABLET DAILY FOR HIGH BLOOD PRESSURE.  ? LUMIGAN 0.01 % SOLN Place 1 drop into both eyes at bedtime.   ? metFORMIN (GLUCOPHAGE) 1000 MG tablet Take 1 tablet (1,000 mg total) by mouth 2 (two) times daily with a meal.  ? montelukast (SINGULAIR) 10 MG tablet Take 1 tablet (10 mg total) by mouth daily.  ? pantoprazole (PROTONIX) 40 MG tablet Take 1 tablet (40 mg total) by mouth daily.  ? phenylephrine (SUDAFED PE) 10 MG TABS tablet Take 10 mg by mouth every 4 (four) hours as needed (sinus congestion).  ? predniSONE (DELTASONE) 20 MG tablet 2 po at sametime daily for 5 days- start tomorrow  ? rosuvastatin (CRESTOR) 20 MG tablet Take 1  tablet (20 mg total) by mouth daily.  ? triamcinolone cream (KENALOG) 0.1 % Apply 1 application topically 2 (two) times daily as needed (dry skin).  ? vitamin B-12 (CYANOCOBALAMIN) 1000 MCG tablet Take 1,000 mcg by mouth daily.  ? zaleplon (SONATA) 5 MG capsule Take 1 capsule (5 mg total) by mouth at bedtime as needed for sleep.  ? [DISCONTINUED] acyclovir cream (ZOVIRAX) 5 % 1 application  ? [DISCONTINUED] ketoconazole (NIZORAL) 2 % cream Apply bid to  rash  ? [DISCONTINUED] ketorolac (ACULAR) 0.4 % SOLN Apply to eye.  ? [DISCONTINUED] mometasone (ELOCON) 0.1 % cream Apply 1 application topically daily. (Patient taking differently: Apply 1 application. topically daily as needed (rash).)  ? ?No facility-administered encounter medications on file as of 08/03/2021.  ? ? ?Allergies  ?Allergen Reactions  ? Ozempic (0.25 Or 0.5 Mg-Dose) [Semaglutide(0.25 Or 0.'5mg'$ -Dos)] Nausea And Vomiting  ?  N/v, no energy  ? ? ?Review of Systems  ?Constitutional:  Negative for activity change, appetite change, chills, fatigue and fever.  ?HENT:  Positive for congestion, postnasal drip and rhinorrhea. Negative for dental problem, drooling, ear discharge, ear pain, facial swelling, hearing loss, mouth sores, nosebleeds, sinus pressure, sinus pain, sneezing, sore throat, tinnitus, trouble swallowing and voice change.   ?Eyes: Negative.  Negative for photophobia and visual disturbance.  ?Respiratory:  Positive for wheezing. Negative for cough, chest tightness and shortness of breath.   ?Cardiovascular:  Negative for chest pain, palpitations and leg swelling.  ?Gastrointestinal:  Negative for abdominal pain, blood in stool, constipation, diarrhea, nausea and vomiting.  ?Endocrine: Negative.   ?Genitourinary:  Negative for decreased urine volume, difficulty urinating, dysuria, frequency and urgency.  ?Musculoskeletal:  Negative for arthralgias and myalgias.  ?Skin: Negative.   ?     Hair loss  ?Allergic/Immunologic: Negative.   ?Neurological:   Negative for dizziness, weakness and headaches.  ?Hematological: Negative.   ?Psychiatric/Behavioral:  Negative for confusion, hallucinations, sleep disturbance and suicidal ideas.   ?All other systems revi

## 2021-08-05 MED ORDER — LEVOTHYROXINE SODIUM 50 MCG PO TABS: 50.0000 ug | ORAL_TABLET | Freq: Every day | ORAL | 3 refills | Status: DC

## 2021-08-05 NOTE — Addendum Note (Signed)
Addended by: Baruch Gouty on: 08/05/2021 02:11 PM   Modules accepted: Orders

## 2021-08-09 NOTE — Telephone Encounter (Signed)
Send to pharmacy per Joycelyn Schmid

## 2021-08-10 ENCOUNTER — Other Ambulatory Visit: Payer: Self-pay | Admitting: Family Medicine

## 2021-08-10 DIAGNOSIS — E1169 Type 2 diabetes mellitus with other specified complication: Secondary | ICD-10-CM

## 2021-08-10 LAB — T3, FREE: T3, Free: 3 pg/mL (ref 2.0–4.4)

## 2021-08-10 LAB — THYROID PANEL WITH TSH
Free Thyroxine Index: 3.1 (ref 1.2–4.9)
T3 Uptake Ratio: 35 % (ref 24–39)
T4, Total: 8.9 ug/dL (ref 4.5–12.0)
TSH: 0.149 u[IU]/mL — ABNORMAL LOW (ref 0.450–4.500)

## 2021-08-10 LAB — ANEMIA PROFILE B
Basophils Absolute: 0 10*3/uL (ref 0.0–0.2)
Basos: 1 %
EOS (ABSOLUTE): 0.2 10*3/uL (ref 0.0–0.4)
Eos: 4 %
Ferritin: 47 ng/mL (ref 15–150)
Folate: 7.5 ng/mL (ref >3.0)
Hematocrit: 37.7 % (ref 34.0–46.6)
Hemoglobin: 12.4 g/dL (ref 11.1–15.9)
Immature Grans (Abs): 0 10*3/uL (ref 0.0–0.1)
Immature Granulocytes: 0 %
Iron Saturation: 12 % — ABNORMAL LOW (ref 15–55)
Iron: 33 ug/dL (ref 27–139)
Lymphocytes Absolute: 0.9 10*3/uL (ref 0.7–3.1)
Lymphs: 21 %
MCH: 30.2 pg (ref 26.6–33.0)
MCHC: 32.9 g/dL (ref 31.5–35.7)
MCV: 92 fL (ref 79–97)
Monocytes Absolute: 0.4 10*3/uL (ref 0.1–0.9)
Monocytes: 10 %
Neutrophils Absolute: 2.8 10*3/uL (ref 1.4–7.0)
Neutrophils: 64 %
Platelets: 188 10*3/uL (ref 150–450)
RBC: 4.1 x10E6/uL (ref 3.77–5.28)
RDW: 12.5 % (ref 11.7–15.4)
Retic Ct Pct: 2.1 % (ref 0.6–2.6)
Total Iron Binding Capacity: 276 ug/dL (ref 250–450)
UIBC: 243 ug/dL (ref 118–369)
Vitamin B-12: 722 pg/mL (ref 232–1245)
WBC: 4.4 10*3/uL (ref 3.4–10.8)

## 2021-08-10 LAB — VITAMIN D 25 HYDROXY (VIT D DEFICIENCY, FRACTURES): Vit D, 25-Hydroxy: 47.5 ng/mL (ref 30.0–100.0)

## 2021-08-10 LAB — TESTOSTERONE: Testosterone: <3 ng/dL — ABNORMAL LOW (ref 3–67)

## 2021-08-10 LAB — DHEA-SULFATE, SERUM: DHEA-Sulfate, LCMS: 28 ug/dL

## 2021-08-17 ENCOUNTER — Other Ambulatory Visit: Payer: Self-pay | Admitting: Family Medicine

## 2021-08-17 DIAGNOSIS — Z79899 Other long term (current) drug therapy: Secondary | ICD-10-CM

## 2021-08-17 DIAGNOSIS — F5101 Primary insomnia: Secondary | ICD-10-CM

## 2021-08-17 NOTE — Telephone Encounter (Signed)
Last OV 08/03/21. Last RF 06/29/21. Next OV 09/29/21

## 2021-08-31 MED ORDER — DENOSUMAB 60 MG/ML ~~LOC~~ SOSY: 60.0000 mg | PREFILLED_SYRINGE | Freq: Once | SUBCUTANEOUS | 0 refills | Status: AC

## 2021-08-31 NOTE — Telephone Encounter (Signed)
Kick back from [facility] states that PA is required for this med.   PA for Prolia started

## 2021-08-31 NOTE — Telephone Encounter (Signed)
PA was not needed - pharm was running incorrect - covered now

## 2021-08-31 NOTE — Telephone Encounter (Signed)
Called pt - aware that I am sending to [facility] now - she will pick up and put in her fridge at home and call us to sched a time for nurse injection.  She is a NEW start, so she can sched anytime once she has the med.   Will be PT supplied

## 2021-09-02 ENCOUNTER — Telehealth: Payer: Self-pay | Admitting: Family Medicine

## 2021-09-02 NOTE — Telephone Encounter (Signed)
Patient states she received Prolia. She will bring in to her 06/20 appt.

## 2021-09-06 ENCOUNTER — Encounter: Payer: Self-pay | Admitting: Family Medicine

## 2021-09-06 ENCOUNTER — Ambulatory Visit (INDEPENDENT_AMBULATORY_CARE_PROVIDER_SITE_OTHER): Payer: Medicare Other | Admitting: Family Medicine

## 2021-09-06 VITALS — BP 127/81 | HR 82 | Temp 98.4°F | Ht 62.0 in | Wt 129.0 lb

## 2021-09-06 DIAGNOSIS — Z13820 Encounter for screening for osteoporosis: Secondary | ICD-10-CM

## 2021-09-06 DIAGNOSIS — M8588 Other specified disorders of bone density and structure, other site: Secondary | ICD-10-CM

## 2021-09-06 DIAGNOSIS — R197 Diarrhea, unspecified: Secondary | ICD-10-CM

## 2021-09-06 DIAGNOSIS — M8580 Other specified disorders of bone density and structure, unspecified site: Secondary | ICD-10-CM

## 2021-09-06 DIAGNOSIS — E039 Hypothyroidism, unspecified: Secondary | ICD-10-CM

## 2021-09-06 DIAGNOSIS — L659 Nonscarring hair loss, unspecified: Secondary | ICD-10-CM | POA: Diagnosis not present

## 2021-09-06 MED ORDER — DENOSUMAB 60 MG/ML ~~LOC~~ SOSY
60.0000 mg | PREFILLED_SYRINGE | Freq: Once | SUBCUTANEOUS | Status: AC
  Administered 2021-09-06: 60 mg via SUBCUTANEOUS

## 2021-09-06 NOTE — Patient Instructions (Signed)
Routine Vegamour Nutrafol Retress

## 2021-09-06 NOTE — Progress Notes (Signed)
Subjective:  Patient ID: [name], female    DOB: 1955/12/28, 66 y.o.   MRN: [id number]  Patient Care Team: Baruch Gouty, FNP as PCP - General (Family Medicine) Lavera Guise, [facility] as Pharmacist (Family Medicine)   Chief Complaint:  Alopecia   HPI: TAJE TONDREAU is a 66 y.o. female presenting on 09/06/2021 for Alopecia   Pt presents today with complaints of worsening hair loss. At last visit, comprehensive labs were completed including hormone panel. Her TSH was slightly low, all other labs acceptable. She has been taking hair, skin, and nail supplement without improvement in symptoms. She also reports intermittent diarrhea. States this will happen at least twice per week, no other associated symptoms with the diarrhea.     Relevant past medical, surgical, family, and social history reviewed and updated as indicated.  Allergies and medications reviewed and updated. Data reviewed: Chart in Epic.   Past Medical History:  Diagnosis Date   Allergy    Asthma    Cancer ([place])    cervical   Complication of anesthesia    " I woke and was short of breath."   CTS (carpal tunnel syndrome)    Diabetes mellitus without complication ([place])    Early cataracts, bilateral    Fracture    trimalleolar ankle left   GERD (gastroesophageal reflux disease)    Glaucoma    Glaucoma    History of kidney stones    " I passed it"   Hyperlipidemia    Hypertension    Hypothyroidism    Neuropathy    Vitamin D deficiency    Wears glasses     Past Surgical History:  Procedure Laterality Date   ABDOMINAL HYSTERECTOMY     APPENDECTOMY     CARDIAC CATHETERIZATION  2006   negative   COLONOSCOPY  2009   negative   INNER EAR SURGERY Right 1981   left shoulder surgery Left 10/2009   ORIF ANKLE FRACTURE Left 09/05/2018   Procedure: OPEN REDUCTION INTERNAL FIXATION TRIMALLEOLAR FRACTURE POSSIBLE SYNDEMOSIS;  Surgeon: Erle Crocker, MD;  Location: [facility];  Service: Orthopedics;   Laterality: Left;   TONSILLECTOMY     TUBAL LIGATION      Social History   Socioeconomic History   Marital status: Married    Spouse name: Elta Guadeloupe   Number of children: 2   Years of education: Not on file   Highest education level: Not on file  Occupational History   Not on file  Tobacco Use   Smoking status: Former    Packs/day: 1.00    Years: 15.00    Total pack years: 15.00    Types: Cigarettes    Quit date: 06/05/2001    Years since quitting: 20.2   Smokeless tobacco: Never  Vaping Use   Vaping Use: Never used  Substance and Sexual Activity   Alcohol use: No   Drug use: No   Sexual activity: Not Currently  Other Topics Concern   Not on file  Social History Narrative   2 sons.   3 grandchildren.   Social Determinants of Health   Financial Resource Strain: Low Risk  (02/16/2021)   Overall Financial Resource Strain (CARDIA)    Difficulty of Paying Living Expenses: Not hard at all  Food Insecurity: No Food Insecurity (02/16/2021)   Hunger Vital Sign    Worried About Running Out of Food in the Last Year: Never true    Ran Out of Food in  the Last Year: Never true  Transportation Needs: No Transportation Needs (02/16/2021)   PRAPARE - Hydrologist (Medical): No    Lack of Transportation (Non-Medical): No  Physical Activity: Insufficiently Active (02/16/2021)   Exercise Vital Sign    Days of Exercise per Week: 3 days    Minutes of Exercise per Session: 30 min  Stress: No Stress Concern Present (02/16/2021)   [place]    Feeling of Stress : Not at all  Social Connections: [place] (02/16/2021)   Social Connection and Isolation Panel [NHANES]    Frequency of Communication with Friends and Family: More than three times a week    Frequency of Social Gatherings with Friends and Family: More than three times a week    Attends Religious Services: 1 to 4 times per  year    Active Member of [employer] or Organizations: Yes    Attends Archivist Meetings: 1 to 4 times per year    Marital Status: Married  Human resources officer Violence: Not At Risk (02/16/2021)   Humiliation, Afraid, Rape, and Kick questionnaire    Fear of Current or Ex-Partner: No    Emotionally Abused: No    Physically Abused: No    Sexually Abused: No    Outpatient Encounter Medications as of 09/06/2021  Medication Sig   acetaminophen (TYLENOL) 500 MG tablet Take 1,000 mg by mouth every 6 (six) hours as needed for moderate pain.   albuterol (VENTOLIN HFA) 108 (90 Base) MCG/ACT inhaler Inhale 2 puffs into the lungs every 6 (six) hours as needed for wheezi ng orshortness of breath.   bimatoprost (LUMIGAN) 0.01 % SOLN Place 1 drop into both eyes at bedtime.   cetirizine (ZYRTEC) 10 MG tablet 1 tablet Orally Once a day for 30 day(s)   Cholecalciferol (VITAMIN D3) 50 MCG (2000 UT) capsule 1 tablet   Continuous Blood Gluc Receiver (FREESTYLE LIBRE 2 READER) DEVI Use to test blood sugar continuously. DX: E11.65   Continuous Blood Gluc Sensor (FREESTYLE LIBRE 2 SENSOR) MISC Use to test blood sugar continuously. DX: E11.65   FARXIGA 5 MG TABS tablet TAKE 1 TABLET DAILY BEFORE BREAKFAST   Fluticasone-Umeclidin-Vilant (TRELEGY ELLIPTA) 200-62.5-25 MCG/ACT AEPB 1 puff Inhalation Once a day   glipiZIDE (GLUCOTROL XL) 10 MG 24 hr tablet Take 1 tablet (10 mg total) by mouth daily with breakfast.   ibuprofen (ADVIL) 200 MG tablet Take 400 mg by mouth every 6 (six) hours as needed for moderate pain.   levothyroxine (SYNTHROID) 50 MCG tablet Take 1 tablet (50 mcg total) by mouth daily.   lisinopril (ZESTRIL) 5 MG tablet TAKE (1) TABLET DAILY FOR HIGH BLOOD PRESSURE.   LUMIGAN 0.01 % SOLN Place 1 drop into both eyes at bedtime.    metFORMIN (GLUCOPHAGE) 1000 MG tablet Take 1 tablet (1,000 mg total) by mouth 2 (two) times daily with a meal.   montelukast (SINGULAIR) 10 MG tablet Take 1 tablet (10 mg  total) by mouth daily.   pantoprazole (PROTONIX) 40 MG tablet Take 1 tablet (40 mg total) by mouth daily.   phenylephrine (SUDAFED PE) 10 MG TABS tablet Take 10 mg by mouth every 4 (four) hours as needed (sinus congestion).   predniSONE (DELTASONE) 20 MG tablet 2 po at sametime daily for 5 days- start tomorrow   PROLIA 60 MG/ML SOSY injection Inject into the skin.   rosuvastatin (CRESTOR) 20 MG tablet TAKE ONE TABLET ONCE DAILY  triamcinolone cream (KENALOG) 0.1 % Apply 1 application topically 2 (two) times daily as needed (dry skin).   vitamin B-12 (CYANOCOBALAMIN) 1000 MCG tablet Take 1,000 mcg by mouth daily.   zaleplon (SONATA) 5 MG capsule TAKE ONE CAPSULE AT BEDTIME AS NEEDED FOR SLEEP   No facility-administered encounter medications on file as of 09/06/2021.    Allergies  Allergen Reactions   Ozempic (0.25 Or 0.5 Mg-Dose) [Semaglutide(0.25 Or 0.56m-Dos)] Nausea And Vomiting    N/v, no energy    Review of Systems  Constitutional:  Negative for activity change, appetite change, chills, diaphoresis, fatigue, fever and unexpected weight change.  Respiratory:  Negative for cough and shortness of breath.   Cardiovascular:  Negative for chest pain, palpitations and leg swelling.  Gastrointestinal:  Positive for diarrhea (intermittent). Negative for abdominal distention, abdominal pain, anal bleeding, blood in stool, constipation, nausea, rectal pain and vomiting.  Genitourinary:  Negative for decreased urine volume and difficulty urinating.  Skin:        Worsening hair loss  Neurological:  Negative for dizziness, tremors, seizures, syncope, facial asymmetry, speech difficulty, weakness, light-headedness, numbness and headaches.  Psychiatric/Behavioral:  Negative for confusion.   All other systems reviewed and are negative.       Objective:  BP 127/81   Pulse 82   Temp 98.4 F (36.9 C)   Ht _0  (1.575 m)   Wt 129 lb (58.5 kg)   SpO2 96%   BMI 23.59 kg/m    Wt Readings  from Last 3 Encounters:  09/06/21 129 lb (58.5 kg)  08/03/21 131 lb (59.4 kg)  06/29/21 132 lb (59.9 kg)    Physical Exam Vitals and nursing note reviewed.  Constitutional:      Appearance: Normal appearance. She is normal weight.  HENT:     Head: Normocephalic and atraumatic.     Mouth/Throat:     Mouth: Mucous membranes are moist.  Eyes:     Pupils: Pupils are equal, round, and reactive to light.  Cardiovascular:     Rate and Rhythm: Normal rate and regular rhythm.     Heart sounds: Normal heart sounds.  Pulmonary:     Effort: Pulmonary effort is normal.     Breath sounds: Normal breath sounds.  Musculoskeletal:     Right lower leg: No edema.     Left lower leg: No edema.  Skin:    General: Skin is warm and dry.     Capillary Refill: Capillary refill takes less than 2 seconds.     Comments: 40% hair loss with pull test  Neurological:     General: No focal deficit present.     Mental Status: She is alert and oriented to person, place, and time.  Psychiatric:        Mood and Affect: Mood normal.        Behavior: Behavior normal.        Thought Content: Thought content normal.        Judgment: Judgment normal.     Results for orders placed or performed in visit on 08/03/21  Anemia Profile B  Result Value Ref Range   Total Iron Binding Capacity 276 250 - 450 ug/dL   UIBC 243 118 - 369 ug/dL   Iron 33 27 - 139 ug/dL   Iron Saturation 12 (L) 15 - 55 %   Ferritin 47 15 - 150 ng/mL   Vitamin B-12 722 232 - 1,245 pg/mL   Folate 7.5 >3.0 ng/mL  WBC 4.4 3.4 - 10.8 x10E3/uL   RBC 4.10 3.77 - 5.28 x10E6/uL   Hemoglobin 12.4 11.1 - 15.9 g/dL   Hematocrit 37.7 34.0 - 46.6 %   MCV 92 79 - 97 fL   MCH 30.2 26.6 - 33.0 pg   MCHC 32.9 31.5 - 35.7 g/dL   RDW 12.5 11.7 - 15.4 %   Platelets 188 150 - 450 x10E3/uL   Neutrophils 64 Not Estab. %   Lymphs 21 Not Estab. %   Monocytes 10 Not Estab. %   Eos 4 Not Estab. %   Basos 1 Not Estab. %   Neutrophils Absolute 2.8 1.4 -  7.0 x10E3/uL   Lymphocytes Absolute 0.9 0.7 - 3.1 x10E3/uL   Monocytes Absolute 0.4 0.1 - 0.9 x10E3/uL   EOS (ABSOLUTE) 0.2 0.0 - 0.4 x10E3/uL   Basophils Absolute 0.0 0.0 - 0.2 x10E3/uL   Immature Granulocytes 0 Not Estab. %   Immature Grans (Abs) 0.0 0.0 - 0.1 x10E3/uL   Retic Ct Pct 2.1 0.6 - 2.6 %  VITAMIN D 25 Hydroxy (Vit-D Deficiency, Fractures)  Result Value Ref Range   Vit D, 25-Hydroxy 47.5 30.0 - 100.0 ng/mL  Testosterone  Result Value Ref Range   Testosterone <3 (L) 3 - 67 ng/dL  DHEA-Sulfate, Serum  Result Value Ref Range   DHEA-Sulfate, LCMS 28 ug/dL  T3, Free  Result Value Ref Range   T3, Free 3.0 2.0 - 4.4 pg/mL  Thyroid Panel With TSH  Result Value Ref Range   TSH 0.149 (L) 0.450 - 4.500 uIU/mL   T4, Total 8.9 4.5 - 12.0 ug/dL   T3 Uptake Ratio 35 24 - 39 %   Free Thyroxine Index 3.1 1.2 - 4.9       Pertinent labs & imaging results that were available during my care of the patient were reviewed by me and considered in my medical decision making.  Assessment & Plan:  Candita was seen today for alopecia.  Diagnoses and all orders for this visit:  Hair loss Ongoing hair loss. 40% with hair pull test. Has tried hair, skin, and nail vitamins without relief of symptoms. Will recheck thyroid today and place referral to dermatology due to worsening symptoms.  -     Thyroid Panel With TSH -     Ambulatory referral to Dermatology  Acquired hypothyroidism Will recheck labs today. -     Thyroid Panel With TSH  Diarrhea in adult patient Intermittent, will check BMP today.  -     BMP8+EGFR     Continue all other maintenance medications.  Follow up plan: Return if symptoms worsen or fail to improve.   Continue healthy lifestyle choices, including diet (rich in fruits, vegetables, and lean proteins, and low in salt and simple carbohydrates) and exercise (at least 30 minutes of moderate physical activity daily).  Educational handout given for alopecia   The  above assessment and management plan was discussed with the patient. The patient verbalized understanding of and has agreed to the management plan. Patient is aware to call the clinic if they develop any new symptoms or if symptoms persist or worsen. Patient is aware when to return to the clinic for a follow-up visit. Patient educated on when it is appropriate to go to the emergency department.   [name], FNP-C [facility] [phone]

## 2021-09-07 ENCOUNTER — Other Ambulatory Visit: Payer: Self-pay

## 2021-09-07 DIAGNOSIS — L659 Nonscarring hair loss, unspecified: Secondary | ICD-10-CM

## 2021-09-07 LAB — BMP8+EGFR
BUN/Creatinine Ratio: 20 (ref 12–28)
BUN: 13 mg/dL (ref 8–27)
CO2: 16 mmol/L — ABNORMAL LOW (ref 20–29)
Calcium: 9.4 mg/dL (ref 8.7–10.3)
Chloride: 108 mmol/L — ABNORMAL HIGH (ref 96–106)
Creatinine, Ser: 0.65 mg/dL (ref 0.57–1.00)
Glucose: 197 mg/dL — ABNORMAL HIGH (ref 70–99)
Potassium: 4 mmol/L (ref 3.5–5.2)
Sodium: 141 mmol/L (ref 134–144)
eGFR: 98 mL/min/{1.73_m2} (ref >59)

## 2021-09-07 LAB — THYROID PANEL WITH TSH
Free Thyroxine Index: 2 (ref 1.2–4.9)
T3 Uptake Ratio: 29 % (ref 24–39)
T4, Total: 7 ug/dL (ref 4.5–12.0)
TSH: 9.05 u[IU]/mL — ABNORMAL HIGH (ref 0.450–4.500)

## 2021-09-07 MED ORDER — LEVOTHYROXINE SODIUM 75 MCG PO TABS: 75.0000 ug | ORAL_TABLET | Freq: Every day | ORAL | 3 refills | Status: DC

## 2021-09-07 NOTE — Addendum Note (Signed)
Addended by: Baruch Gouty on: 09/07/2021 10:13 AM   Modules accepted: Orders

## 2021-09-08 LAB — SPECIMEN STATUS REPORT

## 2021-09-16 ENCOUNTER — Encounter: Payer: Self-pay | Admitting: *Deleted

## 2021-09-23 ENCOUNTER — Other Ambulatory Visit: Payer: Self-pay | Admitting: Family Medicine

## 2021-09-23 DIAGNOSIS — Z79899 Other long term (current) drug therapy: Secondary | ICD-10-CM

## 2021-09-23 DIAGNOSIS — F5101 Primary insomnia: Secondary | ICD-10-CM

## 2021-09-29 ENCOUNTER — Ambulatory Visit: Payer: Medicare Other | Admitting: Family Medicine

## 2021-09-29 NOTE — Progress Notes (Addendum)
[facility] Note  10/11/2021     CHIEF COMPLAINT Patient presents for Retina Follow Up    HISTORY OF PRESENT ILLNESS: Maria [name] is a 66 y.o. female who presents to the clinic today for:   HPI     Retina Follow Up   Patient presents with  Diabetic Retinopathy.  In both eyes.  Severity is moderate.  Duration of 4 months.  Since onset it is stable.  I, the attending physician,  performed the HPI with the patient and updated documentation appropriately.        Comments   Pt here for 4 mo ret f/u for NPDR OU. Pt states no VA changes, no issues.       Last edited by Bernarda Caffey, MD on 10/11/2021  9:23 AM.     Patient states that she woke up in the middle of the night and saw a black blob.   Referring physician: Monna Fam, MD [place], [place] [address]  HISTORICAL INFORMATION:   Selected notes from the MEDICAL RECORD NUMBER Referred by Dr. Herbert Deaner LEE: 01/28/2020 BCVA: 20/20 OU NPDR OU, PCIOL OU, possible CME OS, POAG OU    CURRENT MEDICATIONS: Current Outpatient Medications (Ophthalmic Drugs)  Medication Sig   bimatoprost (LUMIGAN) 0.01 % SOLN Place 1 drop into both eyes at bedtime.   LUMIGAN 0.01 % SOLN Place 1 drop into both eyes at bedtime.    No current facility-administered medications for this visit. (Ophthalmic Drugs)   Current Outpatient Medications (Other)  Medication Sig   acetaminophen (TYLENOL) 500 MG tablet Take 1,000 mg by mouth every 6 (six) hours as needed for moderate pain.   albuterol (VENTOLIN HFA) 108 (90 Base) MCG/ACT inhaler Inhale 2 puffs into the lungs every 6 (six) hours as needed for wheezi ng orshortness of breath.   cetirizine (ZYRTEC) 10 MG tablet 1 tablet Orally Once a day for 30 day(s)   Cholecalciferol (VITAMIN D3) 50 MCG (2000 UT) capsule 1 tablet   Continuous Blood Gluc Receiver (FREESTYLE LIBRE 2 READER) DEVI Use to test blood sugar continuously. DX: E11.65   Continuous  Blood Gluc Sensor (FREESTYLE LIBRE 2 SENSOR) MISC Use to test blood sugar continuously. DX: E11.65   FARXIGA 5 MG TABS tablet TAKE 1 TABLET DAILY BEFORE BREAKFAST   Fluticasone-Umeclidin-Vilant (TRELEGY ELLIPTA) 200-62.5-25 MCG/ACT AEPB 1 puff Inhalation Once a day   glipiZIDE (GLUCOTROL XL) 10 MG 24 hr tablet Take 1 tablet (10 mg total) by mouth daily with breakfast.   ibuprofen (ADVIL) 200 MG tablet Take 400 mg by mouth every 6 (six) hours as needed for moderate pain.   levothyroxine (SYNTHROID) 75 MCG tablet Take 1 tablet (75 mcg total) by mouth daily.   lisinopril (ZESTRIL) 5 MG tablet TAKE (1) TABLET DAILY FOR HIGH BLOOD PRESSURE.   metFORMIN (GLUCOPHAGE) 1000 MG tablet Take 1 tablet (1,000 mg total) by mouth 2 (two) times daily with a meal.   montelukast (SINGULAIR) 10 MG tablet Take 1 tablet (10 mg total) by mouth daily.   pantoprazole (PROTONIX) 40 MG tablet Take 1 tablet (40 mg total) by mouth daily.   phenylephrine (SUDAFED PE) 10 MG TABS tablet Take 10 mg by mouth every 4 (four) hours as needed (sinus congestion).   predniSONE (DELTASONE) 20 MG tablet 2 po at sametime daily for 5 days- start tomorrow   PROLIA 60 MG/ML SOSY injection Inject into the skin.   rosuvastatin (CRESTOR) 20 MG tablet TAKE ONE TABLET  ONCE DAILY   triamcinolone cream (KENALOG) 0.1 % Apply 1 application topically 2 (two) times daily as needed (dry skin).   vitamin B-12 (CYANOCOBALAMIN) 1000 MCG tablet Take 1,000 mcg by mouth daily.   zaleplon (SONATA) 5 MG capsule TAKE ONE CAPSULE AT BEDTIME AS NEEDED FOR SLEEP   No current facility-administered medications for this visit. (Other)   REVIEW OF SYSTEMS: ROS   Positive for: Endocrine, Eyes, Psychiatric Negative for: Constitutional, Gastrointestinal, Neurological, Skin, Genitourinary, Musculoskeletal, HENT, Cardiovascular, Respiratory, Allergic/Imm, Heme/Lymph Last edited by Kingsley Spittle, COT on 10/11/2021  8:41 AM.     ALLERGIES Allergies  Allergen  Reactions   Ozempic (0.25 Or 0.5 Mg-Dose) [Semaglutide(0.25 Or 0.5mg -Dos)] Nausea And Vomiting    N/v, no energy   PAST MEDICAL HISTORY Past Medical History:  Diagnosis Date   Allergy    Asthma    Cancer ([place])    cervical   Complication of anesthesia    " I woke and was short of breath."   CTS (carpal tunnel syndrome)    Diabetes mellitus without complication ([place])    Early cataracts, bilateral    Fracture    trimalleolar ankle left   GERD (gastroesophageal reflux disease)    Glaucoma    Glaucoma    History of kidney stones    " I passed it"   Hyperlipidemia    Hypertension    Hypothyroidism    Neuropathy    Vitamin D deficiency    Wears glasses    Past Surgical History:  Procedure Laterality Date   ABDOMINAL HYSTERECTOMY     APPENDECTOMY     CARDIAC CATHETERIZATION  2006   negative   COLONOSCOPY  2009   negative   INNER EAR SURGERY Right 1981   left shoulder surgery Left 10/2009   ORIF ANKLE FRACTURE Left 09/05/2018   Procedure: OPEN REDUCTION INTERNAL FIXATION TRIMALLEOLAR FRACTURE POSSIBLE SYNDEMOSIS;  Surgeon: Erle Crocker, MD;  Location: [facility];  Service: Orthopedics;  Laterality: Left;   TONSILLECTOMY     TUBAL LIGATION     FAMILY HISTORY Family History  Problem Relation Age of Onset   Diabetes Mother    Prostate cancer Father    Heart disease Father    Heart attack Other    SOCIAL HISTORY Social History   Tobacco Use   Smoking status: Former    Packs/day: 1.00    Years: 15.00    Total pack years: 15.00    Types: Cigarettes    Quit date: 06/05/2001    Years since quitting: 20.3   Smokeless tobacco: Never  Vaping Use   Vaping Use: Never used  Substance Use Topics   Alcohol use: No   Drug use: No       OPHTHALMIC EXAM:  Base Eye Exam     Visual Acuity (Snellen - Linear)       Right Left   Dist cc 20/25 20/20 -2   Dist ph cc NI     Correction: Glasses         Tonometry (Tonopen, 8:46 AM)       Right Left   Pressure 13  12         Pupils       Dark Light Shape React APD   Right 4 3 Round Brisk None   Left 4 3 Round Brisk None         Visual Fields (Counting fingers)       Left Right    Full Full  Extraocular Movement       Right Left    Full, Ortho Full, Ortho         Neuro/Psych     Oriented x3: Yes   Mood/Affect: Normal         Dilation     Both eyes: 1.0% Mydriacyl, 2.5% Phenylephrine @ 8:47 AM           Slit Lamp and Fundus Exam     Slit Lamp Exam       Right Left   Lids/Lashes Dermatochalasis - upper lid, Meibomian gland dysfunction, Telangiectasia Dermatochalasis - upper lid, Meibomian gland dysfunction, Telangiectasia   Conjunctiva/Sclera White and quiet White and quiet   Cornea 1-2+ Punctate epithelial erosions, arcus, well healed temporal cataract wounds, tear film debris 1-2+ inferior Punctate epithelial erosions, arcus, well healed temporal cataract wounds   Anterior Chamber Deep and quiet Deep and quiet   Iris Round and dilated, No NVI Round and dilated, No NVI   Lens PC IOL in good position, 1+ Posterior capsular opacification PC IOL in good position, trace Posterior capsular opacification   Anterior Vitreous mild Vitreous syneresis Vitreous syneresis         Fundus Exam       Right Left   Disc Mild Pallor, Sharp rim, + cupping Mild Pallor, Sharp rim, temporal PPA   C/D Ratio 0.6 0.5   Macula Flat, Blunted foveal reflex, mild RPE mottling and clumping, scattered MA's with focal cystic changes nasal fovea Flat, Blunted foveal reflex, mild RPE mottling and clumping, scattered MA/DBH, focal cluster ST to fovea   Vessels attenuated, Tortuous attenuated, mild tortuosity   Periphery Attached, rare MA g Attached, rare MA/DBH           Refraction     Wearing Rx       Sphere Cylinder Axis Add   Right -0.25 +1.00 016 +2.75   Left -1.00 Sphere  +2.75           IMAGING AND PROCEDURES  Imaging and Procedures for 10/11/2021  OCT,  Retina - OU - Both Eyes       Right Eye Quality was good. Central Foveal Thickness: 305. Progression has no prior data. Findings include no SRF, abnormal foveal contour, intraretinal hyper-reflective material, intraretinal fluid, vitreomacular adhesion (Trace cystic changes/IRF and IRHM nasal macula and fovea-- no reference images to compare).   Left Eye Quality was good. Central Foveal Thickness: 298. Progression has been stable. Findings include normal foveal contour, no IRF, no SRF, retinal drusen (Blunted foveal contour, Trace IRHM).   Notes *Images captured and stored on drive  Diagnosis / Impression:  OD: Trace cystic changes/IRF and IRHM nasal macula and fovea-- no reference images to compare OS: Blunted foveal contour, Trace IRHM  Clinical management:  See below  Abbreviations: NFP - Normal foveal profile. CME - cystoid macular edema. PED - pigment epithelial detachment. IRF - intraretinal fluid. SRF - subretinal fluid. EZ - ellipsoid zone. ERM - epiretinal membrane. ORA - outer retinal atrophy. ORT - outer retinal tubulation. SRHM - subretinal hyper-reflective material. IRHM - intraretinal hyper-reflective material             ASSESSMENT/PLAN:    ICD-10-CM   1. Moderate nonproliferative diabetic retinopathy of both eyes without macular edema associated with type 2 diabetes mellitus (HCC)  E11.3393 OCT, Retina - OU - Both Eyes    2. Essential hypertension  I10     3. Hypertensive retinopathy of both eyes  H35.033  4. Pseudophakia, both eyes  Z96.1     5. Dry eyes  H04.123      1. Moderate nonproliferative diabetic retinopathy, OU  - pt delayed to follow up from 3 months to 12 months (12.08.21-12.19.22) - FA 12.8.21 shows leaking MA, no NV OU  - BCVA today 20/25 OD, 20/20 OS - stable OU - exam w/ scattered MA/DBH OU - Most recent A1C 7.3 (04.12.23) - OCT shows OD: Trace cystic changes/IRF and IRHM nasal macula and fovea-- no reference images to compare; OS:  Blunted foveal contour, Trace IRHM - discussed findings, prognosis, potential treatment options and importance of f/u - no retinal or ophthalmic interventions recommended today - advised tighter control of BG and BP - f/u in 4-6 months -- DFE/OCT  2,3. Hypertensive retinopathy OU - discussed importance of tight BP control - continue to monitor  4. Pseudophakia OU  - s/p CE/IOL (Dr. Herbert Deaner; OS: 08.21, OD: 10.21)  - IOL in good position, doing well  - continue to monitor  5. Dry eyes OU - recommend artificial tears and lubricating ointment as needed   Ophthalmic Meds Ordered this visit:  No orders of the defined types were placed in this encounter.    Return in about 5 months (around 03/13/2022) for MOD NPDR, DFE, OCT.  There are no Patient Instructions on file for this visit.   Explained the diagnoses, plan, and follow up with the patient and they expressed understanding.  Patient expressed understanding of the importance of proper follow up care.   This document serves as a record of services personally performed by Gardiner Sleeper, MD, PhD. It was created on their behalf by Renaldo Reel, [place] an ophthalmic technician. The creation of this record is the provider's dictation and/or activities during the visit.    Electronically signed by:  Renaldo Reel, COT  09/29/21  9:37 AM   Gardiner Sleeper, M.D., Ph.D. Diseases & Surgery of the Retina and Vitreous [facility]  I have reviewed the above documentation for accuracy and completeness, and I agree with the above. Gardiner Sleeper, M.D., Ph.D. 10/11/21 9:37 AM  Abbreviations: M myopia (nearsighted); A astigmatism; H hyperopia (farsighted); P presbyopia; Mrx spectacle prescription;  CTL contact lenses; OD right eye; OS left eye; OU both eyes  XT exotropia; ET esotropia; PEK punctate epithelial keratitis; PEE punctate epithelial erosions; DES dry eye syndrome; MGD meibomian gland dysfunction; ATs  artificial tears; PFAT's preservative free artificial tears; [place] nuclear sclerotic cataract; PSC posterior subcapsular cataract; ERM epi-retinal membrane; PVD posterior vitreous detachment; RD retinal detachment; DM diabetes mellitus; DR diabetic retinopathy; NPDR non-proliferative diabetic retinopathy; PDR proliferative diabetic retinopathy; CSME clinically significant macular edema; DME diabetic macular edema; dbh dot blot hemorrhages; CWS cotton wool spot; POAG primary open angle glaucoma; C/D cup-to-disc ratio; HVF humphrey visual field; GVF goldmann visual field; OCT optical coherence tomography; IOP intraocular pressure; BRVO Branch retinal vein occlusion; CRVO central retinal vein occlusion; CRAO central retinal artery occlusion; BRAO branch retinal artery occlusion; RT retinal tear; SB scleral buckle; PPV pars plana vitrectomy; VH Vitreous hemorrhage; PRP panretinal laser photocoagulation; IVK intravitreal kenalog; VMT vitreomacular traction; MH Macular hole;  NVD neovascularization of the disc; NVE neovascularization elsewhere; AREDS age related eye disease study; ARMD age related macular degeneration; POAG primary open angle glaucoma; EBMD epithelial/anterior basement membrane dystrophy; ACIOL anterior chamber intraocular lens; IOL intraocular lens; PCIOL posterior chamber intraocular lens; Phaco/IOL phacoemulsification with intraocular lens placement; PRK photorefractive keratectomy; LASIK laser assisted in situ  keratomileusis; HTN hypertension; DM diabetes mellitus; COPD chronic obstructive pulmonary disease

## 2021-10-04 ENCOUNTER — Ambulatory Visit: Payer: Medicare Other | Admitting: Family Medicine

## 2021-10-06 ENCOUNTER — Encounter (INDEPENDENT_AMBULATORY_CARE_PROVIDER_SITE_OTHER): Payer: Medicare Other | Admitting: Ophthalmology

## 2021-10-06 DIAGNOSIS — I10 Essential (primary) hypertension: Secondary | ICD-10-CM

## 2021-10-06 DIAGNOSIS — Z961 Presence of intraocular lens: Secondary | ICD-10-CM

## 2021-10-06 DIAGNOSIS — E113393 Type 2 diabetes mellitus with moderate nonproliferative diabetic retinopathy without macular edema, bilateral: Secondary | ICD-10-CM

## 2021-10-06 DIAGNOSIS — H35033 Hypertensive retinopathy, bilateral: Secondary | ICD-10-CM

## 2021-10-06 DIAGNOSIS — H04123 Dry eye syndrome of bilateral lacrimal glands: Secondary | ICD-10-CM

## 2021-10-11 ENCOUNTER — Ambulatory Visit (INDEPENDENT_AMBULATORY_CARE_PROVIDER_SITE_OTHER): Payer: Medicare Other | Admitting: Ophthalmology

## 2021-10-11 ENCOUNTER — Encounter (INDEPENDENT_AMBULATORY_CARE_PROVIDER_SITE_OTHER): Payer: Self-pay | Admitting: Ophthalmology

## 2021-10-11 DIAGNOSIS — I10 Essential (primary) hypertension: Secondary | ICD-10-CM | POA: Diagnosis not present

## 2021-10-11 DIAGNOSIS — Z961 Presence of intraocular lens: Secondary | ICD-10-CM

## 2021-10-11 DIAGNOSIS — H04123 Dry eye syndrome of bilateral lacrimal glands: Secondary | ICD-10-CM

## 2021-10-11 DIAGNOSIS — E113311 Type 2 diabetes mellitus with moderate nonproliferative diabetic retinopathy with macular edema, right eye: Secondary | ICD-10-CM

## 2021-10-11 DIAGNOSIS — H35033 Hypertensive retinopathy, bilateral: Secondary | ICD-10-CM

## 2021-10-11 DIAGNOSIS — E113392 Type 2 diabetes mellitus with moderate nonproliferative diabetic retinopathy without macular edema, left eye: Secondary | ICD-10-CM

## 2021-10-11 DIAGNOSIS — E113393 Type 2 diabetes mellitus with moderate nonproliferative diabetic retinopathy without macular edema, bilateral: Secondary | ICD-10-CM | POA: Diagnosis not present

## 2021-10-12 ENCOUNTER — Encounter: Payer: Self-pay | Admitting: Family Medicine

## 2021-10-12 ENCOUNTER — Ambulatory Visit (INDEPENDENT_AMBULATORY_CARE_PROVIDER_SITE_OTHER): Payer: Medicare Other | Admitting: Family Medicine

## 2021-10-12 VITALS — BP 117/71 | HR 95 | Temp 98.6°F | Ht 62.0 in | Wt 125.0 lb

## 2021-10-12 DIAGNOSIS — E1159 Type 2 diabetes mellitus with other circulatory complications: Secondary | ICD-10-CM

## 2021-10-12 DIAGNOSIS — E785 Hyperlipidemia, unspecified: Secondary | ICD-10-CM

## 2021-10-12 DIAGNOSIS — E1169 Type 2 diabetes mellitus with other specified complication: Secondary | ICD-10-CM | POA: Diagnosis not present

## 2021-10-12 DIAGNOSIS — Z23 Encounter for immunization: Secondary | ICD-10-CM

## 2021-10-12 DIAGNOSIS — I152 Hypertension secondary to endocrine disorders: Secondary | ICD-10-CM

## 2021-10-12 DIAGNOSIS — Z794 Long term (current) use of insulin: Secondary | ICD-10-CM | POA: Diagnosis not present

## 2021-10-12 LAB — BAYER DCA HB A1C WAIVED: HB A1C (BAYER DCA - WAIVED): 7.3 % — ABNORMAL HIGH (ref 4.8–5.6)

## 2021-10-12 MED ORDER — DAPAGLIFLOZIN PROPANEDIOL 10 MG PO TABS: 10.0000 mg | ORAL_TABLET | Freq: Every day | ORAL | 6 refills | Status: DC

## 2021-10-12 NOTE — Patient Instructions (Addendum)

## 2021-10-12 NOTE — Progress Notes (Signed)
Subjective:  Patient ID: Maria [name], female    DOB: 05-10-1955, 66 y.o.   MRN: [id number]  Patient Care Team: Baruch Gouty, FNP as PCP - General (Family Medicine) Lavera Guise, [facility] as Pharmacist (Family Medicine)   Chief Complaint:  Medical Management of Chronic Issues (3 month ) and Diabetes   HPI: Maria [name] is a 66 y.o. female presenting on 10/12/2021 for Medical Management of Chronic Issues (3 month ) and Diabetes   Diabetes She presents for her follow-up diabetic visit. She has type 2 diabetes mellitus. Her disease course has been stable. Pertinent negatives for hypoglycemia include no confusion, dizziness, headaches, hunger, mood changes, nervousness/anxiousness, pallor, seizures, sleepiness, speech difficulty, sweats or tremors. Associated symptoms include polydipsia and polyphagia. Pertinent negatives for diabetes include no blurred vision, no chest pain, no fatigue, no foot paresthesias, no foot ulcerations, no polyuria, no visual change, no weakness and no weight loss. Pertinent negatives for hypoglycemia complications include no blackouts, no hospitalization, no nocturnal hypoglycemia, no required assistance and no required glucagon injection. Symptoms are stable. Diabetic complications include heart disease, PVD and retinopathy. Pertinent negatives for diabetic complications include no CVA. Risk factors for coronary artery disease include dyslipidemia, diabetes mellitus, hypertension and post-menopausal. Current diabetic treatment includes oral agent (triple therapy). She is compliant with treatment all of the time. Her weight is decreasing steadily. She is following a diabetic and generally healthy diet. Meal planning includes avoidance of concentrated sweets and carbohydrate counting. Her home blood glucose trend is fluctuating minimally. An ACE inhibitor/angiotensin II receptor blocker is being taken. Eye exam is current.  Hypertension This is a chronic problem. The  problem is unchanged. The problem is controlled. Pertinent negatives include no anxiety, blurred vision, chest pain, headaches, malaise/fatigue, neck pain, orthopnea, palpitations, peripheral edema, PND, shortness of breath or sweats. Risk factors for coronary artery disease include diabetes mellitus, dyslipidemia and post-menopausal state. Past treatments include ACE inhibitors. The current treatment provides significant improvement. There are no compliance problems.  Hypertensive end-organ damage includes PVD and retinopathy. There is no history of angina, kidney disease, CAD/MI, CVA, heart failure or left ventricular hypertrophy. There is no history of chronic renal disease.  Hyperlipidemia This is a chronic problem. The problem is controlled. Recent lipid tests were reviewed and are normal. Exacerbating diseases include diabetes. She has no history of chronic renal disease, hypothyroidism, liver disease, obesity or nephrotic syndrome. Pertinent negatives include no chest pain, myalgias or shortness of breath. Current antihyperlipidemic treatment includes statins. The current treatment provides significant improvement of lipids. There are no compliance problems.  Risk factors for coronary artery disease include diabetes mellitus, dyslipidemia, hypertension and post-menopausal.    Relevant past medical, surgical, family, and social history reviewed and updated as indicated.  Allergies and medications reviewed and updated. Data reviewed: Chart in Epic.   Past Medical History:  Diagnosis Date   Allergy    Asthma    Cancer ([place])    cervical   Complication of anesthesia    " I woke and was short of breath."   CTS (carpal tunnel syndrome)    Diabetes mellitus without complication (HCC)    Early cataracts, bilateral    Fracture    trimalleolar ankle left   GERD (gastroesophageal reflux disease)    Glaucoma    Glaucoma    History of kidney stones    " I passed it"   Hyperlipidemia     Hypertension    Hypothyroidism  Neuropathy    Vitamin D deficiency    Wears glasses     Past Surgical History:  Procedure Laterality Date   ABDOMINAL HYSTERECTOMY     APPENDECTOMY     CARDIAC CATHETERIZATION  2006   negative   COLONOSCOPY  2009   negative   INNER EAR SURGERY Right 1981   left shoulder surgery Left 10/2009   ORIF ANKLE FRACTURE Left 09/05/2018   Procedure: OPEN REDUCTION INTERNAL FIXATION TRIMALLEOLAR FRACTURE POSSIBLE SYNDEMOSIS;  Surgeon: Erle Crocker, MD;  Location: [facility];  Service: Orthopedics;  Laterality: Left;   TONSILLECTOMY     TUBAL LIGATION      Social History   Socioeconomic History   Marital status: Married    Spouse name: Elta Guadeloupe   Number of children: 2   Years of education: Not on file   Highest education level: Not on file  Occupational History   Not on file  Tobacco Use   Smoking status: Former    Packs/day: 1.00    Years: 15.00    Total pack years: 15.00    Types: Cigarettes    Quit date: 06/05/2001    Years since quitting: 20.3   Smokeless tobacco: Never  Vaping Use   Vaping Use: Never used  Substance and Sexual Activity   Alcohol use: No   Drug use: No   Sexual activity: Not Currently  Other Topics Concern   Not on file  Social History Narrative   2 sons.   3 grandchildren.   Social Determinants of Health   Financial Resource Strain: Low Risk  (02/16/2021)   Overall Financial Resource Strain (CARDIA)    Difficulty of Paying Living Expenses: Not hard at all  Food Insecurity: No Food Insecurity (02/16/2021)   Hunger Vital Sign    Worried About Running Out of Food in the Last Year: Never true    Ran Out of Food in the Last Year: Never true  Transportation Needs: No Transportation Needs (02/16/2021)   PRAPARE - Hydrologist (Medical): No    Lack of Transportation (Non-Medical): No  Physical Activity: Insufficiently Active (02/16/2021)   Exercise Vital Sign    Days of Exercise per  Week: 3 days    Minutes of Exercise per Session: 30 min  Stress: No Stress Concern Present (02/16/2021)   [place]    Feeling of Stress : Not at all  Social Connections: [place] (02/16/2021)   Social Connection and Isolation Panel [NHANES]    Frequency of Communication with Friends and Family: More than three times a week    Frequency of Social Gatherings with Friends and Family: More than three times a week    Attends Religious Services: 1 to 4 times per year    Active Member of [employer] or Organizations: Yes    Attends Archivist Meetings: 1 to 4 times per year    Marital Status: Married  Human resources officer Violence: Not At Risk (02/16/2021)   Humiliation, Afraid, Rape, and Kick questionnaire    Fear of Current or Ex-Partner: No    Emotionally Abused: No    Physically Abused: No    Sexually Abused: No    Outpatient Encounter Medications as of 10/12/2021  Medication Sig   acetaminophen (TYLENOL) 500 MG tablet Take 1,000 mg by mouth every 6 (six) hours as needed for moderate pain.   albuterol (VENTOLIN HFA) 108 (90 Base) MCG/ACT inhaler Inhale 2 puffs into  the lungs every 6 (six) hours as needed for wheezi ng orshortness of breath.   bimatoprost (LUMIGAN) 0.01 % SOLN Place 1 drop into both eyes at bedtime.   cetirizine (ZYRTEC) 10 MG tablet 1 tablet Orally Once a day for 30 day(s)   Cholecalciferol (VITAMIN D3) 50 MCG (2000 UT) capsule 1 tablet   Continuous Blood Gluc Receiver (FREESTYLE LIBRE 2 READER) DEVI Use to test blood sugar continuously. DX: E11.65   Continuous Blood Gluc Sensor (FREESTYLE LIBRE 2 SENSOR) MISC Use to test blood sugar continuously. DX: E11.65   dapagliflozin propanediol (FARXIGA) 10 MG TABS tablet Take 1 tablet (10 mg total) by mouth daily before breakfast.   Fluticasone-Umeclidin-Vilant (TRELEGY ELLIPTA) 200-62.5-25 MCG/ACT AEPB 1 puff Inhalation Once a day   glipiZIDE  (GLUCOTROL XL) 10 MG 24 hr tablet Take 1 tablet (10 mg total) by mouth daily with breakfast.   levothyroxine (SYNTHROID) 75 MCG tablet Take 1 tablet (75 mcg total) by mouth daily.   lisinopril (ZESTRIL) 5 MG tablet TAKE (1) TABLET DAILY FOR HIGH BLOOD PRESSURE.   LUMIGAN 0.01 % SOLN Place 1 drop into both eyes at bedtime.    metFORMIN (GLUCOPHAGE) 1000 MG tablet Take 1 tablet (1,000 mg total) by mouth 2 (two) times daily with a meal.   montelukast (SINGULAIR) 10 MG tablet Take 1 tablet (10 mg total) by mouth daily.   pantoprazole (PROTONIX) 40 MG tablet Take 1 tablet (40 mg total) by mouth daily.   phenylephrine (SUDAFED PE) 10 MG TABS tablet Take 10 mg by mouth every 4 (four) hours as needed (sinus congestion).   PROLIA 60 MG/ML SOSY injection Inject into the skin.   rosuvastatin (CRESTOR) 20 MG tablet TAKE ONE TABLET ONCE DAILY   triamcinolone cream (KENALOG) 0.1 % Apply 1 application topically 2 (two) times daily as needed (dry skin).   vitamin B-12 (CYANOCOBALAMIN) 1000 MCG tablet Take 1,000 mcg by mouth daily.   [DISCONTINUED] FARXIGA 5 MG TABS tablet TAKE 1 TABLET DAILY BEFORE BREAKFAST   [DISCONTINUED] ibuprofen (ADVIL) 200 MG tablet Take 400 mg by mouth every 6 (six) hours as needed for moderate pain.   [DISCONTINUED] predniSONE (DELTASONE) 20 MG tablet 2 po at sametime daily for 5 days- start tomorrow   [DISCONTINUED] zaleplon (SONATA) 5 MG capsule TAKE ONE CAPSULE AT BEDTIME AS NEEDED FOR SLEEP (Patient not taking: Reported on 10/12/2021)   No facility-administered encounter medications on file as of 10/12/2021.    Allergies  Allergen Reactions   Ozempic (0.25 Or 0.5 Mg-Dose) [Semaglutide(0.25 Or 0.5mg -Dos)] Nausea And Vomiting    N/v, no energy    Review of Systems  Constitutional:  Negative for activity change, appetite change, chills, diaphoresis, fatigue, fever, malaise/fatigue, unexpected weight change and weight loss.  HENT: Negative.    Eyes: Negative.  Negative for  blurred vision, photophobia and visual disturbance.  Respiratory:  Negative for cough, chest tightness and shortness of breath.   Cardiovascular:  Negative for chest pain, palpitations, orthopnea, leg swelling and PND.  Gastrointestinal:  Negative for abdominal pain, blood in stool, constipation, diarrhea, nausea and vomiting.  Endocrine: Positive for polydipsia and polyphagia. Negative for polyuria.  Genitourinary:  Negative for decreased urine volume, difficulty urinating, dysuria, frequency and urgency.  Musculoskeletal:  Negative for arthralgias, myalgias and neck pain.  Skin: Negative.  Negative for pallor.  Allergic/Immunologic: Negative.   Neurological:  Negative for dizziness, tremors, seizures, speech difficulty, weakness and headaches.  Hematological: Negative.   Psychiatric/Behavioral:  Positive for sleep disturbance. Negative for agitation,  behavioral problems, confusion, decreased concentration, dysphoric mood, hallucinations, self-injury and suicidal ideas. The patient is not nervous/anxious and is not hyperactive.   All other systems reviewed and are negative.       Objective:  BP 117/71   Pulse 95   Temp 98.6 F (37 C)   Ht $R'5\' 2"'Gv$  (1.575 m)   Wt 125 lb (56.7 kg)   SpO2 97%   BMI 22.86 kg/m    Wt Readings from Last 3 Encounters:  10/12/21 125 lb (56.7 kg)  09/06/21 129 lb (58.5 kg)  08/03/21 131 lb (59.4 kg)    Physical Exam Vitals and nursing note reviewed.  Constitutional:      General: She is not in acute distress.    Appearance: Normal appearance. She is well-developed, well-groomed and normal weight. She is not ill-appearing, toxic-appearing or diaphoretic.  HENT:     Head: Normocephalic and atraumatic.     Jaw: There is normal jaw occlusion.     Right Ear: Hearing normal.     Left Ear: Hearing normal.     Nose: Nose normal.     Mouth/Throat:     Lips: Pink.     Mouth: Mucous membranes are moist.     Pharynx: Uvula midline.  Eyes:     General: Lids  are normal.     Pupils: Pupils are equal, round, and reactive to light.  Neck:     Thyroid: No thyroid mass, thyromegaly or thyroid tenderness.     Vascular: No carotid bruit or JVD.     Trachea: Trachea and phonation normal.  Cardiovascular:     Rate and Rhythm: Normal rate and regular rhythm.     Chest Wall: PMI is not displaced.     Pulses: Normal pulses.     Heart sounds: Normal heart sounds. No murmur heard.    No friction rub. No gallop.  Pulmonary:     Effort: Pulmonary effort is normal. No respiratory distress.     Breath sounds: Normal breath sounds. No wheezing.  Abdominal:     General: Bowel sounds are normal. There is no distension or abdominal bruit.     Palpations: Abdomen is soft. There is no hepatomegaly or splenomegaly.     Tenderness: There is no abdominal tenderness. There is no right CVA tenderness or left CVA tenderness.     Hernia: No hernia is present.  Musculoskeletal:        General: Normal range of motion.     Cervical back: Normal range of motion and neck supple.     Right lower leg: No edema.     Left lower leg: No edema.  Lymphadenopathy:     Cervical: No cervical adenopathy.  Skin:    General: Skin is warm and dry.     Capillary Refill: Capillary refill takes less than 2 seconds.     Coloration: Skin is not cyanotic, jaundiced or pale.     Findings: No rash.  Neurological:     General: No focal deficit present.     Mental Status: She is alert and oriented to person, place, and time.     Sensory: Sensation is intact.     Motor: Motor function is intact.     Coordination: Coordination is intact.     Gait: Gait is intact.     Deep Tendon Reflexes: Reflexes are normal and symmetric.  Psychiatric:        Attention and Perception: Attention and perception normal.  Mood and Affect: Mood and affect normal.        Speech: Speech normal.        Behavior: Behavior normal. Behavior is cooperative.        Thought Content: Thought content normal.         Cognition and Memory: Cognition and memory normal.        Judgment: Judgment normal.     Results for orders placed or performed in visit on 09/06/21  Thyroid Panel With TSH  Result Value Ref Range   TSH 9.050 (H) 0.450 - 4.500 uIU/mL   T4, Total 7.0 4.5 - 12.0 ug/dL   T3 Uptake Ratio 29 24 - 39 %   Free Thyroxine Index 2.0 1.2 - 4.9  BMP8+EGFR  Result Value Ref Range   Glucose 197 (H) 70 - 99 mg/dL   BUN 13 8 - 27 mg/dL   Creatinine, Ser 0.65 0.57 - 1.00 mg/dL   eGFR 98 >59 mL/min/1.73   BUN/Creatinine Ratio 20 12 - 28   Sodium 141 134 - 144 mmol/L   Potassium 4.0 3.5 - 5.2 mmol/L   Chloride 108 (H) 96 - 106 mmol/L   CO2 16 (L) 20 - 29 mmol/L   Calcium 9.4 8.7 - 10.3 mg/dL  Specimen status report  Result Value Ref Range   specimen status report Comment        Pertinent labs & imaging results that were available during my care of the patient were reviewed by me and considered in my medical decision making.  Assessment & Plan:  Maria [name] was seen today for medical management of chronic issues and diabetes.  Diagnoses and all orders for this visit:  Type 2 diabetes mellitus with other specified complication, with long-term current use of insulin (HCC) A1C 7.3 in office today, will increase Farxiga to 10 mg. Continue diet and exercise. Other labs pending. Follow up in 3 months, sooner if warranted.  -     Bayer DCA Hb A1c Waived -     CBC with Differential/Platelet -     CMP14+EGFR -     Lipid panel -     dapagliflozin propanediol (FARXIGA) 10 MG TABS tablet; Take 1 tablet (10 mg total) by mouth daily before breakfast.  Hyperlipidemia associated with type 2 diabetes mellitus ([place]) Diet encouraged - increase intake of fresh fruits and vegetables, increase intake of lean proteins. Bake, broil, or grill foods. Avoid fried, greasy, and fatty foods. Avoid fast foods. Increase intake of fiber-rich whole grains. Exercise encouraged - at least 150 minutes per week and advance as  tolerated. Continue medications as prescribed. Follow up in 3-6 months as discussed.  -     Bayer DCA Hb A1c Waived -     CBC with Differential/Platelet -     CMP14+EGFR -     Lipid panel  Hypertension associated with type 2 diabetes mellitus (HCC) BP well controlled. Changes were not made in regimen toay. Goal BP is 130/80. Pt aware to report any persistent high or low readings. DASH diet and exercise encouraged. Exercise at least 150 minutes per week and increase as tolerated. Goal BMI > 25. Stress management encouraged. Avoid nicotine and tobacco product use. Avoid excessive alcohol and NSAID's. Avoid more than 2000 mg of sodium daily. Medications as prescribed. Follow up as scheduled.  -     Bayer DCA Hb A1c Waived -     CBC with Differential/Platelet -     CMP14+EGFR -     Lipid  panel  Need for viral immunization -     Zoster Recombinant (Shingrix )     Continue all other maintenance medications.  Follow up plan: Return in about 3 months (around 01/12/2022), or if symptoms worsen or fail to improve, for DM.   Continue healthy lifestyle choices, including diet (rich in fruits, vegetables, and lean proteins, and low in salt and simple carbohydrates) and exercise (at least 30 minutes of moderate physical activity daily).  Educational handout given for DM  The above assessment and management plan was discussed with the patient. The patient verbalized understanding of and has agreed to the management plan. Patient is aware to call the clinic if they develop any new symptoms or if symptoms persist or worsen. Patient is aware when to return to the clinic for a follow-up visit. Patient educated on when it is appropriate to go to the emergency department.   [name], FNP-C [facility] [phone]

## 2021-10-13 LAB — CBC WITH DIFFERENTIAL/PLATELET
Basophils Absolute: 0 10*3/uL (ref 0.0–0.2)
Basos: 0 %
EOS (ABSOLUTE): 0.2 10*3/uL (ref 0.0–0.4)
Eos: 3 %
Hematocrit: 41.4 % (ref 34.0–46.6)
Hemoglobin: 14.2 g/dL (ref 11.1–15.9)
Immature Grans (Abs): 0 10*3/uL (ref 0.0–0.1)
Immature Granulocytes: 0 %
Lymphocytes Absolute: 1.1 10*3/uL (ref 0.7–3.1)
Lymphs: 19 %
MCH: 30.5 pg (ref 26.6–33.0)
MCHC: 34.3 g/dL (ref 31.5–35.7)
MCV: 89 fL (ref 79–97)
Monocytes Absolute: 0.4 10*3/uL (ref 0.1–0.9)
Monocytes: 7 %
Neutrophils Absolute: 4.2 10*3/uL (ref 1.4–7.0)
Neutrophils: 71 %
Platelets: 207 10*3/uL (ref 150–450)
RBC: 4.66 x10E6/uL (ref 3.77–5.28)
RDW: 13.3 % (ref 11.7–15.4)
WBC: 5.9 10*3/uL (ref 3.4–10.8)

## 2021-10-13 LAB — CMP14+EGFR
ALT: 23 IU/L (ref 0–32)
AST: 16 IU/L (ref 0–40)
Albumin/Globulin Ratio: 2.3 — ABNORMAL HIGH (ref 1.2–2.2)
Albumin: 4.6 g/dL (ref 3.9–4.9)
Alkaline Phosphatase: 115 IU/L (ref 44–121)
BUN/Creatinine Ratio: 18 (ref 12–28)
BUN: 13 mg/dL (ref 8–27)
Bilirubin Total: 0.8 mg/dL (ref 0.0–1.2)
CO2: 20 mmol/L (ref 20–29)
Calcium: 8.9 mg/dL (ref 8.7–10.3)
Chloride: 103 mmol/L (ref 96–106)
Creatinine, Ser: 0.73 mg/dL (ref 0.57–1.00)
Globulin, Total: 2 g/dL (ref 1.5–4.5)
Glucose: 206 mg/dL — ABNORMAL HIGH (ref 70–99)
Potassium: 4.4 mmol/L (ref 3.5–5.2)
Sodium: 135 mmol/L (ref 134–144)
Total Protein: 6.6 g/dL (ref 6.0–8.5)
eGFR: 91 mL/min/{1.73_m2} (ref >59)

## 2021-10-13 LAB — LIPID PANEL
Chol/HDL Ratio: 3 ratio (ref 0.0–4.4)
Cholesterol, Total: 139 mg/dL (ref 100–199)
HDL: 46 mg/dL (ref >39)
LDL Chol Calc (NIH): 67 mg/dL (ref 0–99)
Triglycerides: 149 mg/dL (ref 0–149)
VLDL Cholesterol Cal: 26 mg/dL (ref 5–40)

## 2021-10-26 DIAGNOSIS — L650 Telogen effluvium: Secondary | ICD-10-CM | POA: Diagnosis not present

## 2021-10-29 ENCOUNTER — Other Ambulatory Visit: Payer: Self-pay | Admitting: Family Medicine

## 2021-10-29 DIAGNOSIS — E1169 Type 2 diabetes mellitus with other specified complication: Secondary | ICD-10-CM

## 2021-11-25 ENCOUNTER — Other Ambulatory Visit: Payer: Self-pay | Admitting: Family Medicine

## 2021-11-25 DIAGNOSIS — E039 Hypothyroidism, unspecified: Secondary | ICD-10-CM

## 2021-11-30 ENCOUNTER — Other Ambulatory Visit: Payer: Self-pay | Admitting: *Deleted

## 2021-11-30 DIAGNOSIS — E039 Hypothyroidism, unspecified: Secondary | ICD-10-CM

## 2021-11-30 MED ORDER — LEVOTHYROXINE SODIUM 75 MCG PO TABS: 75.0000 ug | ORAL_TABLET | Freq: Every day | ORAL | 2 refills | Status: DC

## 2021-11-30 NOTE — Telephone Encounter (Signed)
Fax from [facility]: Levothyroxine 75 mcg Was denied RF on 11/25/21 below comment was on both requests. Comment:  script we had was d/c'd due to confusion of strength.. need a new script for the 75 mcg. RF sent #[phone] w/ 2 RFs

## 2021-12-15 DIAGNOSIS — E113291 Type 2 diabetes mellitus with mild nonproliferative diabetic retinopathy without macular edema, right eye: Secondary | ICD-10-CM | POA: Diagnosis not present

## 2021-12-15 DIAGNOSIS — H401132 Primary open-angle glaucoma, bilateral, moderate stage: Secondary | ICD-10-CM | POA: Diagnosis not present

## 2021-12-15 DIAGNOSIS — H26493 Other secondary cataract, bilateral: Secondary | ICD-10-CM | POA: Diagnosis not present

## 2021-12-15 DIAGNOSIS — H524 Presbyopia: Secondary | ICD-10-CM | POA: Diagnosis not present

## 2021-12-15 DIAGNOSIS — E113392 Type 2 diabetes mellitus with moderate nonproliferative diabetic retinopathy without macular edema, left eye: Secondary | ICD-10-CM | POA: Diagnosis not present

## 2021-12-15 LAB — HM DIABETES EYE EXAM

## 2022-01-17 ENCOUNTER — Ambulatory Visit: Payer: Medicare Other | Admitting: Family Medicine

## 2022-01-17 ENCOUNTER — Encounter: Payer: Self-pay | Admitting: Family Medicine

## 2022-01-17 ENCOUNTER — Ambulatory Visit (INDEPENDENT_AMBULATORY_CARE_PROVIDER_SITE_OTHER): Payer: Medicare Other | Admitting: Family Medicine

## 2022-01-17 DIAGNOSIS — U071 COVID-19: Secondary | ICD-10-CM | POA: Diagnosis not present

## 2022-01-17 MED ORDER — MOLNUPIRAVIR EUA 200MG CAPSULE: 4.0000 | ORAL_CAPSULE | Freq: Two times a day (BID) | ORAL | 0 refills | Status: AC

## 2022-01-17 MED ORDER — MOLNUPIRAVIR EUA 200MG CAPSULE: 4.0000 | ORAL_CAPSULE | Freq: Two times a day (BID) | ORAL | 0 refills | Status: DC

## 2022-01-17 NOTE — Progress Notes (Signed)
Virtual Visit via telephone Note Due to COVID-19 pandemic this visit was conducted virtually. This visit type was conducted due to national recommendations for restrictions regarding the COVID-19 Pandemic (e.g. social distancing, sheltering in place) in an effort to limit this patient's exposure and mitigate transmission in our community. All issues noted in this document were discussed and addressed.  A physical exam was not performed with this format.   I connected with Maria [name] on 01/17/2022 at [phone] by telephone and verified that I am speaking with the correct person using two identifiers. Maria [name] is currently located at home and family is currently with them during visit. The provider, [name], FNP is located in their office at time of visit.  I discussed the limitations, risks, security and privacy concerns of performing an evaluation and management service by virtual visit and the availability of in person appointments. I also discussed with the patient that there may be a patient responsible charge related to this service. The patient expressed understanding and agreed to proceed.  Subjective:  Patient ID: Maria [name], female    DOB: Nov 20, 1955, 66 y.o.   MRN: [id number]  Chief Complaint:  Covid Positive   HPI: Maria [name] is a 66 y.o. female presenting on 01/17/2022 for Covid Positive   Pt reports URI symptoms for 2 days, cough, congestion, fatigue/malaise, rhinorrhea, and sore throat. Tested positive for COVID yesterday. Has been taking tylenol and Mucinex with some relief of symptoms.      Relevant past medical, surgical, family, and social history reviewed and updated as indicated.  Allergies and medications reviewed and updated.   Past Medical History:  Diagnosis Date   Allergy    Asthma    Cancer ([place])    cervical   Complication of anesthesia    " I woke and was short of breath."   CTS (carpal tunnel syndrome)    Diabetes mellitus without  complication ([place])    Early cataracts, bilateral    Fracture    trimalleolar ankle left   GERD (gastroesophageal reflux disease)    Glaucoma    Glaucoma    History of kidney stones    " I passed it"   Hyperlipidemia    Hypertension    Hypothyroidism    Neuropathy    Vitamin D deficiency    Wears glasses     Past Surgical History:  Procedure Laterality Date   ABDOMINAL HYSTERECTOMY     APPENDECTOMY     CARDIAC CATHETERIZATION  2006   negative   COLONOSCOPY  2009   negative   INNER EAR SURGERY Right 1981   left shoulder surgery Left 10/2009   ORIF ANKLE FRACTURE Left 09/05/2018   Procedure: OPEN REDUCTION INTERNAL FIXATION TRIMALLEOLAR FRACTURE POSSIBLE SYNDEMOSIS;  Surgeon: Erle Crocker, MD;  Location: [facility];  Service: Orthopedics;  Laterality: Left;   TONSILLECTOMY     TUBAL LIGATION      Social History   Socioeconomic History   Marital status: Married    Spouse name: Elta Guadeloupe   Number of children: 2   Years of education: Not on file   Highest education level: Not on file  Occupational History   Not on file  Tobacco Use   Smoking status: Former    Packs/day: 1.00    Years: 15.00    Total pack years: 15.00    Types: Cigarettes    Quit date: 06/05/2001    Years since quitting: 20.6   Smokeless tobacco:  Never  Vaping Use   Vaping Use: Never used  Substance and Sexual Activity   Alcohol use: No   Drug use: No   Sexual activity: Not Currently  Other Topics Concern   Not on file  Social History Narrative   2 sons.   3 grandchildren.   Social Determinants of Health   Financial Resource Strain: Low Risk  (02/16/2021)   Overall Financial Resource Strain (CARDIA)    Difficulty of Paying Living Expenses: Not hard at all  Food Insecurity: No Food Insecurity (02/16/2021)   Hunger Vital Sign    Worried About Running Out of Food in the Last Year: Never true    Ran Out of Food in the Last Year: Never true  Transportation Needs: No Transportation Needs  (02/16/2021)   PRAPARE - Hydrologist (Medical): No    Lack of Transportation (Non-Medical): No  Physical Activity: Insufficiently Active (02/16/2021)   Exercise Vital Sign    Days of Exercise per Week: 3 days    Minutes of Exercise per Session: 30 min  Stress: No Stress Concern Present (02/16/2021)   [place]    Feeling of Stress : Not at all  Social Connections: [place] (02/16/2021)   Social Connection and Isolation Panel [NHANES]    Frequency of Communication with Friends and Family: More than three times a week    Frequency of Social Gatherings with Friends and Family: More than three times a week    Attends Religious Services: 1 to 4 times per year    Active Member of [employer] or Organizations: Yes    Attends Archivist Meetings: 1 to 4 times per year    Marital Status: Married  Human resources officer Violence: Not At Risk (02/16/2021)   Humiliation, Afraid, Rape, and Kick questionnaire    Fear of Current or Ex-Partner: No    Emotionally Abused: No    Physically Abused: No    Sexually Abused: No    Outpatient Encounter Medications as of 01/17/2022  Medication Sig   [DISCONTINUED] molnupiravir EUA (LAGEVRIO) 200 mg CAPS capsule Take 4 capsules (800 mg total) by mouth 2 (two) times daily for 5 days.   acetaminophen (TYLENOL) 500 MG tablet Take 1,000 mg by mouth every 6 (six) hours as needed for moderate pain.   albuterol (VENTOLIN HFA) 108 (90 Base) MCG/ACT inhaler Inhale 2 puffs into the lungs every 6 (six) hours as needed for wheezi ng orshortness of breath.   bimatoprost (LUMIGAN) 0.01 % SOLN Place 1 drop into both eyes at bedtime.   cetirizine (ZYRTEC) 10 MG tablet 1 tablet Orally Once a day for 30 day(s)   Cholecalciferol (VITAMIN D3) 50 MCG (2000 UT) capsule 1 tablet   Continuous Blood Gluc Receiver (FREESTYLE LIBRE 2 READER) DEVI Use to test blood sugar  continuously. DX: E11.65   Continuous Blood Gluc Sensor (FREESTYLE LIBRE 2 SENSOR) MISC Use to test blood sugar continuously. DX: E11.65   dapagliflozin propanediol (FARXIGA) 10 MG TABS tablet Take 1 tablet (10 mg total) by mouth daily before breakfast.   Fluticasone-Umeclidin-Vilant (TRELEGY ELLIPTA) 200-62.5-25 MCG/ACT AEPB 1 puff Inhalation Once a day   glipiZIDE (GLUCOTROL XL) 10 MG 24 hr tablet Take 1 tablet (10 mg total) by mouth daily with breakfast.   levothyroxine (SYNTHROID) 75 MCG tablet Take 1 tablet (75 mcg total) by mouth daily.   lisinopril (ZESTRIL) 5 MG tablet TAKE (1) TABLET DAILY FOR HIGH BLOOD PRESSURE.  LUMIGAN 0.01 % SOLN Place 1 drop into both eyes at bedtime.    metFORMIN (GLUCOPHAGE) 1000 MG tablet Take 1 tablet (1,000 mg total) by mouth 2 (two) times daily with a meal.   molnupiravir EUA (LAGEVRIO) 200 mg CAPS capsule Take 4 capsules (800 mg total) by mouth 2 (two) times daily for 5 days.   montelukast (SINGULAIR) 10 MG tablet Take 1 tablet (10 mg total) by mouth daily.   pantoprazole (PROTONIX) 40 MG tablet Take 1 tablet (40 mg total) by mouth daily.   phenylephrine (SUDAFED PE) 10 MG TABS tablet Take 10 mg by mouth every 4 (four) hours as needed (sinus congestion).   PROLIA 60 MG/ML SOSY injection Inject into the skin.   rosuvastatin (CRESTOR) 20 MG tablet TAKE ONE TABLET ONCE DAILY   triamcinolone cream (KENALOG) 0.1 % Apply 1 application topically 2 (two) times daily as needed (dry skin).   vitamin B-12 (CYANOCOBALAMIN) 1000 MCG tablet Take 1,000 mcg by mouth daily.   No facility-administered encounter medications on file as of 01/17/2022.    Allergies  Allergen Reactions   Ozempic (0.25 Or 0.5 Mg-Dose) [Semaglutide(0.25 Or 0.'5mg'$ -Dos)] Nausea And Vomiting    N/v, no energy    Review of Systems  Constitutional:  Positive for activity change, appetite change, fatigue and fever. Negative for chills, diaphoresis and unexpected weight change.  HENT:  Positive  for congestion, ear pain, postnasal drip, rhinorrhea and sore throat. Negative for dental problem, drooling, ear discharge, facial swelling, hearing loss, mouth sores, nosebleeds, sinus pressure, sinus pain, sneezing, tinnitus, trouble swallowing and voice change.   Eyes:  Negative for photophobia, pain, discharge, redness, itching and visual disturbance.  Respiratory:  Positive for cough. Negative for apnea, choking, chest tightness, shortness of breath, wheezing and stridor.   Cardiovascular:  Negative for chest pain, palpitations and leg swelling.  Gastrointestinal:  Negative for abdominal pain, constipation, diarrhea, nausea and vomiting.  Endocrine: Negative for cold intolerance, polydipsia, polyphagia and polyuria.  Genitourinary:  Negative for decreased urine volume and difficulty urinating.  Musculoskeletal:  Positive for myalgias.  Neurological:  Positive for headaches. Negative for dizziness, tremors, seizures, syncope, facial asymmetry, speech difficulty, weakness, light-headedness and numbness.  Psychiatric/Behavioral:  Negative for confusion.   All other systems reviewed and are negative.        Observations/Objective: No vital signs or physical exam, this was a virtual health encounter.  Pt alert and oriented, answers all questions appropriately, and able to speak in full sentences.    Assessment and Plan: Maria [name] was seen today for covid positive.  Diagnoses and all orders for this visit:  Positive self-administered antigen test for COVID-19 COVID positive at home. Continue Mucinex and Tylenol. Add Lagevrio as prescribed. Report new, worsening, or persistent symptoms.  -     molnupiravir EUA (LAGEVRIO) 200 mg CAPS capsule; Take 4 capsules (800 mg total) by mouth 2 (two) times daily for 5 days.     Follow Up Instructions: Return if symptoms worsen or fail to improve.    I discussed the assessment and treatment plan with the patient. The patient was provided an  opportunity to ask questions and all were answered. The patient agreed with the plan and demonstrated an understanding of the instructions.   The patient was advised to call back or seek an in-person evaluation if the symptoms worsen or if the condition fails to improve as anticipated.  The above assessment and management plan was discussed with the patient. The patient verbalized understanding of and  has agreed to the management plan. Patient is aware to call the clinic if they develop any new symptoms or if symptoms persist or worsen. Patient is aware when to return to the clinic for a follow-up visit. Patient educated on when it is appropriate to go to the emergency department.    I provided 15 minutes of time during this telephone encounter.   [name], FNP-C [facility] [address] [place], [place] [address] [phone] 01/17/2022

## 2022-01-18 ENCOUNTER — Ambulatory Visit: Payer: Medicare Other | Admitting: Family Medicine

## 2022-02-01 ENCOUNTER — Encounter: Payer: Self-pay | Admitting: Family Medicine

## 2022-02-01 ENCOUNTER — Ambulatory Visit (INDEPENDENT_AMBULATORY_CARE_PROVIDER_SITE_OTHER): Payer: Medicare Other | Admitting: Family Medicine

## 2022-02-01 VITALS — BP 106/64 | HR 92 | Temp 97.0°F | Ht 62.0 in | Wt 121.4 lb

## 2022-02-01 DIAGNOSIS — E1159 Type 2 diabetes mellitus with other circulatory complications: Secondary | ICD-10-CM | POA: Diagnosis not present

## 2022-02-01 DIAGNOSIS — F5101 Primary insomnia: Secondary | ICD-10-CM

## 2022-02-01 DIAGNOSIS — E785 Hyperlipidemia, unspecified: Secondary | ICD-10-CM | POA: Diagnosis not present

## 2022-02-01 DIAGNOSIS — Z794 Long term (current) use of insulin: Secondary | ICD-10-CM | POA: Diagnosis not present

## 2022-02-01 DIAGNOSIS — E039 Hypothyroidism, unspecified: Secondary | ICD-10-CM | POA: Diagnosis not present

## 2022-02-01 DIAGNOSIS — I152 Hypertension secondary to endocrine disorders: Secondary | ICD-10-CM

## 2022-02-01 DIAGNOSIS — E1169 Type 2 diabetes mellitus with other specified complication: Secondary | ICD-10-CM | POA: Diagnosis not present

## 2022-02-01 LAB — BAYER DCA HB A1C WAIVED: HB A1C (BAYER DCA - WAIVED): 7.1 % — ABNORMAL HIGH (ref 4.8–5.6)

## 2022-02-01 MED ORDER — BELSOMRA 5 MG PO TABS: 5.0000 mg | ORAL_TABLET | Freq: Every evening | ORAL | 0 refills | Status: DC | PRN

## 2022-02-01 NOTE — Progress Notes (Signed)
Subjective:  Patient ID: Maria [name], female    DOB: 1956-01-10, 66 y.o.   MRN: [id number]  Patient Care Team: Baruch Gouty, FNP as PCP - General (Family Medicine) Lavera Guise, [facility] as Pharmacist (Family Medicine)   Chief Complaint:  Medical Management of Chronic Issues (6 month follow up)   HPI: Maria [name] is a 65 y.o. female presenting on 02/01/2022 for Medical Management of Chronic Issues (6 month follow up)   1. Type 2 diabetes mellitus with other specified complication, with long-term current use of insulin (HCC) Reports some high readings over the last several weeks but she has been sick with COVID recently. Does have polyphagia but denies polyuria or polydipsia. Complaint with medications without associated side effects.   2. Acquired hypothyroidism Last TSH was elevated. Dosing was adjusted. Pt states she has done well this change. Denies hyper- or hypothyroid symptoms.   3. Hyperlipidemia associated with type 2 diabetes mellitus ([place]) Complaint with medications. Denies myalgias. Does watch diet. Does not follow a regular exercise routine. Last lipid panel normal, LDL below 70.  4. Hypertension associated with type 2 diabetes mellitus ([place]) Doing well with current regimen. Denies headaches, chest pain, leg swelling, shortness of breath, visual changes, dizziness, weakness, or confusion. Does limit salt intake.   5. Primary insomnia Has been on Sonata, trazodone, and vistaril without relief of insomnia. States she has a hard time falling asleep and then staying asleep. Denies excessive caffeine intake or stimulant use.      Relevant past medical, surgical, family, and social history reviewed and updated as indicated.  Allergies and medications reviewed and updated. Data reviewed: Chart in Epic.   Past Medical History:  Diagnosis Date   Allergy    Asthma    Cancer ([place])    cervical   Complication of anesthesia    " I woke and was short of breath."    CTS (carpal tunnel syndrome)    Diabetes mellitus without complication ([place])    Early cataracts, bilateral    Fracture    trimalleolar ankle left   GERD (gastroesophageal reflux disease)    Glaucoma    Glaucoma    History of kidney stones    " I passed it"   Hyperlipidemia    Hypertension    Hypothyroidism    Neuropathy    Vitamin D deficiency    Wears glasses     Past Surgical History:  Procedure Laterality Date   ABDOMINAL HYSTERECTOMY     APPENDECTOMY     CARDIAC CATHETERIZATION  2006   negative   COLONOSCOPY  2009   negative   INNER EAR SURGERY Right 1981   left shoulder surgery Left 10/2009   ORIF ANKLE FRACTURE Left 09/05/2018   Procedure: OPEN REDUCTION INTERNAL FIXATION TRIMALLEOLAR FRACTURE POSSIBLE SYNDEMOSIS;  Surgeon: Erle Crocker, MD;  Location: [place];  Service: Orthopedics;  Laterality: Left;   TONSILLECTOMY     TUBAL LIGATION      Social History   Socioeconomic History   Marital status: Married    Spouse name: Elta Guadeloupe   Number of children: 2   Years of education: Not on file   Highest education level: Not on file  Occupational History   Not on file  Tobacco Use   Smoking status: Former    Packs/day: 1.00    Years: 15.00    Total pack years: 15.00    Types: Cigarettes    Quit date: 06/05/2001  Years since quitting: 20.6   Smokeless tobacco: Never  Vaping Use   Vaping Use: Never used  Substance and Sexual Activity   Alcohol use: No   Drug use: No   Sexual activity: Not Currently  Other Topics Concern   Not on file  Social History Narrative   2 sons.   3 grandchildren.   Social Determinants of Health   Financial Resource Strain: Low Risk  (02/16/2021)   Overall Financial Resource Strain (CARDIA)    Difficulty of Paying Living Expenses: Not hard at all  Food Insecurity: No Food Insecurity (02/16/2021)   Hunger Vital Sign    Worried About Running Out of Food in the Last Year: Never true    Ran Out of Food in the Last Year: Never  true  Transportation Needs: No Transportation Needs (02/16/2021)   PRAPARE - Hydrologist (Medical): No    Lack of Transportation (Non-Medical): No  Physical Activity: Insufficiently Active (02/16/2021)   Exercise Vital Sign    Days of Exercise per Week: 3 days    Minutes of Exercise per Session: 30 min  Stress: No Stress Concern Present (02/16/2021)   [place]    Feeling of Stress : Not at all  Social Connections: [place] (02/16/2021)   Social Connection and Isolation Panel [NHANES]    Frequency of Communication with Friends and Family: More than three times a week    Frequency of Social Gatherings with Friends and Family: More than three times a week    Attends Religious Services: 1 to 4 times per year    Active Member of [employer] or Organizations: Yes    Attends Archivist Meetings: 1 to 4 times per year    Marital Status: Married  Human resources officer Violence: Not At Risk (02/16/2021)   Humiliation, Afraid, Rape, and Kick questionnaire    Fear of Current or Ex-Partner: No    Emotionally Abused: No    Physically Abused: No    Sexually Abused: No    Outpatient Encounter Medications as of 02/01/2022  Medication Sig   acetaminophen (TYLENOL) 500 MG tablet Take 1,000 mg by mouth every 6 (six) hours as needed for moderate pain.   albuterol (VENTOLIN HFA) 108 (90 Base) MCG/ACT inhaler Inhale 2 puffs into the lungs every 6 (six) hours as needed for wheezi ng orshortness of breath.   bimatoprost (LUMIGAN) 0.01 % SOLN Place 1 drop into both eyes at bedtime.   cetirizine (ZYRTEC) 10 MG tablet 1 tablet Orally Once a day for 30 day(s)   Cholecalciferol (VITAMIN D3) 50 MCG (2000 UT) capsule 1 tablet   Continuous Blood Gluc Receiver (FREESTYLE LIBRE 2 READER) DEVI Use to test blood sugar continuously. DX: E11.65   Continuous Blood Gluc Sensor (FREESTYLE LIBRE 2 SENSOR) MISC Use to  test blood sugar continuously. DX: E11.65   dapagliflozin propanediol (FARXIGA) 10 MG TABS tablet Take 1 tablet (10 mg total) by mouth daily before breakfast.   Fluticasone-Umeclidin-Vilant (TRELEGY ELLIPTA) 200-62.5-25 MCG/ACT AEPB 1 puff Inhalation Once a day   glipiZIDE (GLUCOTROL XL) 10 MG 24 hr tablet Take 1 tablet (10 mg total) by mouth daily with breakfast.   levothyroxine (SYNTHROID) 75 MCG tablet Take 1 tablet (75 mcg total) by mouth daily.   lisinopril (ZESTRIL) 5 MG tablet TAKE (1) TABLET DAILY FOR HIGH BLOOD PRESSURE.   LUMIGAN 0.01 % SOLN Place 1 drop into both eyes at bedtime.  metFORMIN (GLUCOPHAGE) 1000 MG tablet Take 1 tablet (1,000 mg total) by mouth 2 (two) times daily with a meal.   montelukast (SINGULAIR) 10 MG tablet Take 1 tablet (10 mg total) by mouth daily.   pantoprazole (PROTONIX) 40 MG tablet Take 1 tablet (40 mg total) by mouth daily.   phenylephrine (SUDAFED PE) 10 MG TABS tablet Take 10 mg by mouth every 4 (four) hours as needed (sinus congestion).   PROLIA 60 MG/ML SOSY injection Inject into the skin.   rosuvastatin (CRESTOR) 20 MG tablet TAKE ONE TABLET ONCE DAILY   Suvorexant (BELSOMRA) 5 MG TABS Take 5 mg by mouth at bedtime as needed.   triamcinolone cream (KENALOG) 0.1 % Apply 1 application topically 2 (two) times daily as needed (dry skin).   vitamin B-12 (CYANOCOBALAMIN) 1000 MCG tablet Take 1,000 mcg by mouth daily.   No facility-administered encounter medications on file as of 02/01/2022.    Allergies  Allergen Reactions   Ozempic (0.25 Or 0.5 Mg-Dose) [Semaglutide(0.25 Or 0.4m-Dos)] Nausea And Vomiting    N/v, no energy    Review of Systems  Constitutional:  Negative for activity change, appetite change, chills, diaphoresis, fatigue, fever and unexpected weight change.  HENT: Negative.    Eyes: Negative.  Negative for photophobia and visual disturbance.  Respiratory:  Negative for cough, chest tightness and shortness of breath.    Cardiovascular:  Negative for chest pain, palpitations and leg swelling.  Gastrointestinal:  Negative for abdominal pain, blood in stool, constipation, diarrhea, nausea and vomiting.  Endocrine: Negative.  Negative for polydipsia, polyphagia and polyuria.  Genitourinary:  Negative for decreased urine volume, difficulty urinating, dysuria, frequency and urgency.  Musculoskeletal:  Negative for arthralgias and myalgias.  Skin: Negative.   Allergic/Immunologic: Negative.   Neurological:  Negative for dizziness, tremors, seizures, syncope, facial asymmetry, speech difficulty, weakness, light-headedness, numbness and headaches.  Hematological: Negative.   Psychiatric/Behavioral:  Positive for sleep disturbance. Negative for confusion, hallucinations and suicidal ideas.   All other systems reviewed and are negative.       Objective:  BP 106/64   Pulse 92   Temp (!) 97 F (36.1 C) (Temporal)   Ht _0  (1.575 m)   Wt 121 lb 6.4 oz (55.1 kg)   SpO2 96%   BMI 22.20 kg/m    Wt Readings from Last 3 Encounters:  02/01/22 121 lb 6.4 oz (55.1 kg)  10/12/21 125 lb (56.7 kg)  09/06/21 129 lb (58.5 kg)    Physical Exam Vitals and nursing note reviewed.  Constitutional:      General: She is not in acute distress.    Appearance: Normal appearance. She is well-developed, well-groomed and normal weight. She is not ill-appearing, toxic-appearing or diaphoretic.  HENT:     Head: Normocephalic and atraumatic.     Jaw: There is normal jaw occlusion.     Right Ear: Hearing normal.     Left Ear: Hearing normal.     Nose: Nose normal.     Mouth/Throat:     Lips: Pink.     Mouth: Mucous membranes are moist.     Pharynx: Oropharynx is clear. Uvula midline.  Eyes:     General: Lids are normal.     Extraocular Movements: Extraocular movements intact.     Conjunctiva/sclera: Conjunctivae normal.     Pupils: Pupils are equal, round, and reactive to light.  Neck:     Thyroid: No thyroid mass,  thyromegaly or thyroid tenderness.     Vascular: No  carotid bruit or JVD.     Trachea: Trachea and phonation normal.  Cardiovascular:     Rate and Rhythm: Normal rate and regular rhythm.     Chest Wall: PMI is not displaced.     Pulses: Normal pulses.     Heart sounds: Normal heart sounds. No murmur heard.    No friction rub. No gallop.  Pulmonary:     Effort: Pulmonary effort is normal. No respiratory distress.     Breath sounds: Normal breath sounds. No wheezing.  Abdominal:     General: Bowel sounds are normal. There is no distension or abdominal bruit.     Palpations: Abdomen is soft. There is no hepatomegaly or splenomegaly.     Tenderness: There is no abdominal tenderness. There is no right CVA tenderness or left CVA tenderness.     Hernia: No hernia is present.  Musculoskeletal:        General: Normal range of motion.     Cervical back: Normal range of motion and neck supple.     Right lower leg: No edema.     Left lower leg: No edema.  Lymphadenopathy:     Cervical: No cervical adenopathy.  Skin:    General: Skin is warm and dry.     Capillary Refill: Capillary refill takes less than 2 seconds.     Coloration: Skin is not cyanotic, jaundiced or pale.     Findings: No rash.  Neurological:     General: No focal deficit present.     Mental Status: She is alert and oriented to person, place, and time.     Sensory: Sensation is intact.     Motor: Motor function is intact.     Coordination: Coordination is intact.     Gait: Gait is intact.     Deep Tendon Reflexes: Reflexes are normal and symmetric.  Psychiatric:        Attention and Perception: Attention and perception normal.        Mood and Affect: Mood and affect normal.        Speech: Speech normal.        Behavior: Behavior normal. Behavior is cooperative.        Thought Content: Thought content normal.        Cognition and Memory: Cognition and memory normal.        Judgment: Judgment normal.     Results for  orders placed or performed in visit on 12/23/21  HM DIABETES EYE EXAM  Result Value Ref Range   HM Diabetic Eye Exam No Retinopathy No Retinopathy       Pertinent labs & imaging results that were available during my care of the patient were reviewed by me and considered in my medical decision making.  Assessment & Plan:  Maria [name] was seen today for medical management of chronic issues.  Diagnoses and all orders for this visit:  Type 2 diabetes mellitus with other specified complication, with long-term current use of insulin ([place]) A1C 7.1 in office today. Will continue current regimen. Diet and exercise encouraged. Labs pending. Follow up in 3 months for reevaluation.  -     Bayer DCA Hb A1c Waived -     Thyroid Panel With TSH -     Vitamin B12 -     CMP14+EGFR -     Microalbumin / creatinine urine ratio  Acquired hypothyroidism Thyroid disease has been fairly controlled. Labs are pending. Adjustments to regimen will be made if warranted. Make  sure to take medications on an empty stomach with a full glass of water. Make sure to avoid vitamins or supplements for at least 4 hours before and 4 hours after taking medications. Repeat labs in 3 months if adjustments are made and in 6 months if stable.   -     Thyroid Panel With TSH  Hyperlipidemia associated with type 2 diabetes mellitus ([place]) Diet encouraged - increase intake of fresh fruits and vegetables, increase intake of lean proteins. Bake, broil, or grill foods. Avoid fried, greasy, and fatty foods. Avoid fast foods. Increase intake of fiber-rich whole grains. Exercise encouraged - at least 150 minutes per week and advance as tolerated. Goal BMI < 25. Continue medications as prescribed. Follow up in 3-6 months as discussed.  -     CMP14+EGFR  Hypertension associated with type 2 diabetes mellitus (HCC) BP well controlled. Changes were not made in regimen today. Goal BP is 130/80. Pt aware to report any persistent high or low readings. DASH  diet and exercise encouraged. Exercise at least 150 minutes per week and increase as tolerated. Goal BMI > 25. Stress management encouraged. Avoid nicotine and tobacco product use. Avoid excessive alcohol and NSAID's. Avoid more than 2000 mg of sodium daily. Medications as prescribed. Follow up as scheduled.  -     Thyroid Panel With TSH -     CMP14+EGFR -     Microalbumin / creatinine urine ratio  Primary insomnia Did not do well on Sonata. Will trial below to see if beneficial. Sleep hygiene discussed in detail.  -     Suvorexant (BELSOMRA) 5 MG TABS; Take 5 mg by mouth at bedtime as needed.     Continue all other maintenance medications.  Follow up plan: Return in about 3 months (around 05/04/2022), or if symptoms worsen or fail to improve, for DM.   Continue healthy lifestyle choices, including diet (rich in fruits, vegetables, and lean proteins, and low in salt and simple carbohydrates) and exercise (at least 30 minutes of moderate physical activity daily).  Educational handout given for DM  The above assessment and management plan was discussed with the patient. The patient verbalized understanding of and has agreed to the management plan. Patient is aware to call the clinic if they develop any new symptoms or if symptoms persist or worsen. Patient is aware when to return to the clinic for a follow-up visit. Patient educated on when it is appropriate to go to the emergency department.   [name], FNP-C [facility] [phone]

## 2022-02-01 NOTE — Patient Instructions (Signed)

## 2022-02-02 LAB — CMP14+EGFR
ALT: 14 IU/L (ref 0–32)
AST: 16 IU/L (ref 0–40)
Albumin/Globulin Ratio: 2.3 — ABNORMAL HIGH (ref 1.2–2.2)
Albumin: 4.3 g/dL (ref 3.9–4.9)
Alkaline Phosphatase: 76 IU/L (ref 44–121)
BUN/Creatinine Ratio: 16 (ref 12–28)
BUN: 11 mg/dL (ref 8–27)
Bilirubin Total: 0.9 mg/dL (ref 0.0–1.2)
CO2: 22 mmol/L (ref 20–29)
Calcium: 9.2 mg/dL (ref 8.7–10.3)
Chloride: 106 mmol/L (ref 96–106)
Creatinine, Ser: 0.7 mg/dL (ref 0.57–1.00)
Globulin, Total: 1.9 g/dL (ref 1.5–4.5)
Glucose: 154 mg/dL — ABNORMAL HIGH (ref 70–99)
Potassium: 3.7 mmol/L (ref 3.5–5.2)
Sodium: 142 mmol/L (ref 134–144)
Total Protein: 6.2 g/dL (ref 6.0–8.5)
eGFR: 96 mL/min/{1.73_m2} (ref >59)

## 2022-02-02 LAB — MICROALBUMIN / CREATININE URINE RATIO
Creatinine, Urine: 69.5 mg/dL
Microalb/Creat Ratio: 28 mg/g creat (ref 0–29)
Microalbumin, Urine: 19.3 ug/mL

## 2022-02-02 LAB — THYROID PANEL WITH TSH
Free Thyroxine Index: 2.3 (ref 1.2–4.9)
T3 Uptake Ratio: 30 % (ref 24–39)
T4, Total: 7.5 ug/dL (ref 4.5–12.0)
TSH: 3.65 u[IU]/mL (ref 0.450–4.500)

## 2022-02-02 LAB — VITAMIN B12: Vitamin B-12: 730 pg/mL (ref 232–1245)

## 2022-02-16 ENCOUNTER — Telehealth: Payer: Self-pay | Admitting: Family Medicine

## 2022-02-20 ENCOUNTER — Ambulatory Visit (INDEPENDENT_AMBULATORY_CARE_PROVIDER_SITE_OTHER): Payer: Medicare Other

## 2022-02-20 VITALS — Ht 62.0 in | Wt 121.0 lb

## 2022-02-20 DIAGNOSIS — Z0000 Encounter for general adult medical examination without abnormal findings: Secondary | ICD-10-CM | POA: Diagnosis not present

## 2022-02-20 NOTE — Patient Instructions (Signed)
[name] , Thank you for taking time to come for your Medicare Wellness Visit. I appreciate your ongoing commitment to your health goals. Please review the following plan we discussed and let me know if I can assist you in the future.   These are the goals we discussed:  Goals       Have 3 meals a day      Pt would like continue to eat healthy and maintain blood sugars.       T2DM, HTN, HLD PHARMD GOALS (pt-stated)      Current Barriers:  Unable to achieve control of T2DM  Suboptimal therapeutic regimen for T2DM  Pharmacist Clinical Goal(s):  patient will achieve control of T2DM as evidenced by A1C<7%, IMPROVED GLYCEMIC CONTROL maintain control of T2DM as evidenced by A1C<7%, IMPROVED GLYCEMIC CONTROL  through collaboration with PharmD and provider.    Interventions: 1:1 collaboration with Rakes, Connye Burkitt, FNP regarding development and update of comprehensive plan of care as evidenced by provider attestation and co-signature Inter-disciplinary care team collaboration (see longitudinal plan of care) Comprehensive medication review performed; medication list updated in electronic medical record  Diabetes: New goal. Uncontrolled-A1C IS 6.3% in 01/2021, but blood sugar continues to rise; current treatment: GLIPIZIDE XL '10MG'$  DAILY, METFORMIN (1 gram daily-GFR 63);  Patient reports she is feeling tired/weak on ozempic 72-monthtrial.  This is her 2nd time trying a GLP1.  She would like to discontinue it Lantus last dose 03/25/21, last dose ozempic was 03/21/21 Current glucose readings:  FBG 177, 140, 129, 125 109 164 196 124 180 192 PM after dinner  383, 154 239 202 267 237 Denies hypoglycemic/hyperglycemic symptoms Discussed meal planning options and Plate method for healthy eating Avoid sugary drinks and desserts Incorporate balanced protein, non starchy veggies, 1 serving of carbohydrate with each meal Increase water intake Increase physical activity as able Current exercise:  N/A Blood pressure and cholesterol controlled-continue Recommended GLP1 (RYBELSUS) OR SGLT2--PATIENT DECLINED; patient/daughter-in-law requested pioglitazone Will call in [place] to see if it is covered   Patient Goals/Self-Care Activities patient will:  - take medications as prescribed as evidenced by patient report and record review check glucose daily, document, and provide at future appointments engage in dietary modifications by FOLLOWING A HEART HEALTHY DIET/HEALTHY PLATE METHOD          This is a list of the screening recommended for you and due dates:  Health Maintenance  Topic Date Due   COVID-19 Vaccine (1) Never done   Pneumonia Vaccine (1 - PCV) 06/30/2022*   Colon Cancer Screening  02/02/2023*   Mammogram  07/12/2022   Hemoglobin A1C  08/02/2022   Complete foot exam   10/13/2022   Eye exam for diabetics  12/16/2022   Yearly kidney function blood test for diabetes  02/02/2023   Yearly kidney health urinalysis for diabetes  02/02/2023   Medicare Annual Wellness Visit  02/21/2023   DTaP/Tdap/Td vaccine (2 - Td or Tdap) 06/26/2023   Flu Shot  Completed   DEXA scan (bone density measurement)  Completed   Hepatitis C Screening: USPSTF Recommendation to screen - Ages 150-79yo.  Completed   Zoster (Shingles) Vaccine  Completed   HPV Vaccine  Aged Out  *Topic was postponed. The date shown is not the original due date.    Advanced directives: Advance directive discussed with you today. I have provided a copy for you to complete at home and have notarized. Once this is complete please bring a copy  in to our office so we can scan it into your chart.   Conditions/risks identified: Aim for 30 minutes of exercise or brisk walking, 6-8 glasses of water, and 5 servings of fruits and vegetables each day.   Next appointment: Follow up in one year for your annual wellness visit    Preventive Care 65 Years and Older, Female Preventive care refers to lifestyle choices and  visits with your health care provider that can promote health and wellness. What does preventive care include? A yearly physical exam. This is also called an annual well check. Dental exams once or twice a year. Routine eye exams. Ask your health care provider how often you should have your eyes checked. Personal lifestyle choices, including: Daily care of your teeth and gums. Regular physical activity. Eating a healthy diet. Avoiding tobacco and drug use. Limiting alcohol use. Practicing safe sex. Taking low-dose aspirin every day. Taking vitamin and mineral supplements as recommended by your health care provider. What happens during an annual well check? The services and screenings done by your health care provider during your annual well check will depend on your age, overall health, lifestyle risk factors, and family history of disease. Counseling  Your health care provider may ask you questions about your: Alcohol use. Tobacco use. Drug use. Emotional well-being. Home and relationship well-being. Sexual activity. Eating habits. History of falls. Memory and ability to understand (cognition). Work and work Statistician. Reproductive health. Screening  You may have the following tests or measurements: Height, weight, and BMI. Blood pressure. Lipid and cholesterol levels. These may be checked every 5 years, or more frequently if you are over 28 years old. Skin check. Lung cancer screening. You may have this screening every year starting at age 91 if you have a 30-pack-year history of smoking and currently smoke or have quit within the past 15 years. Fecal occult blood test (FOBT) of the stool. You may have this test every year starting at age 35. Flexible sigmoidoscopy or colonoscopy. You may have a sigmoidoscopy every 5 years or a colonoscopy every 10 years starting at age 40. Hepatitis C blood test. Hepatitis B blood test. Sexually transmitted disease (STD)  testing. Diabetes screening. This is done by checking your blood sugar (glucose) after you have not eaten for a while (fasting). You may have this done every 1-3 years. Bone density scan. This is done to screen for osteoporosis. You may have this done starting at age 73. Mammogram. This may be done every 1-2 years. Talk to your health care provider about how often you should have regular mammograms. Talk with your health care provider about your test results, treatment options, and if necessary, the need for more tests. Vaccines  Your health care provider may recommend certain vaccines, such as: Influenza vaccine. This is recommended every year. Tetanus, diphtheria, and acellular pertussis (Tdap, Td) vaccine. You may need a Td booster every 10 years. Zoster vaccine. You may need this after age 6. Pneumococcal 13-valent conjugate (PCV13) vaccine. One dose is recommended after age 91. Pneumococcal polysaccharide (PPSV23) vaccine. One dose is recommended after age 30. Talk to your health care provider about which screenings and vaccines you need and how often you need them. This information is not intended to replace advice given to you by your health care provider. Make sure you discuss any questions you have with your health care provider. Document Released: 04/02/2015 Document Revised: 11/24/2015 Document Reviewed: 01/05/2015 Elsevier Interactive Patient Education  2017 [place]  in the Home Falls can cause injuries. They can happen to people of all ages. There are many things you can do to make your home safe and to help prevent falls. What can I do on the outside of my home? Regularly fix the edges of walkways and driveways and fix any cracks. Remove anything that might make you trip as you walk through a door, such as a raised step or threshold. Trim any bushes or trees on the path to your home. Use bright outdoor lighting. Clear any walking paths of anything that  might make someone trip, such as rocks or tools. Regularly check to see if handrails are loose or broken. Make sure that both sides of any steps have handrails. Any raised decks and porches should have guardrails on the edges. Have any leaves, snow, or ice cleared regularly. Use sand or salt on walking paths during winter. Clean up any spills in your garage right away. This includes oil or grease spills. What can I do in the bathroom? Use night lights. Install grab bars by the toilet and in the tub and shower. Do not use towel bars as grab bars. Use non-skid mats or decals in the tub or shower. If you need to sit down in the shower, use a plastic, non-slip stool. Keep the floor dry. Clean up any water that spills on the floor as soon as it happens. Remove soap buildup in the tub or shower regularly. Attach bath mats securely with double-sided non-slip rug tape. Do not have throw rugs and other things on the floor that can make you trip. What can I do in the bedroom? Use night lights. Make sure that you have a light by your bed that is easy to reach. Do not use any sheets or blankets that are too big for your bed. They should not hang down onto the floor. Have a firm chair that has side arms. You can use this for support while you get dressed. Do not have throw rugs and other things on the floor that can make you trip. What can I do in the kitchen? Clean up any spills right away. Avoid walking on wet floors. Keep items that you use a lot in easy-to-reach places. If you need to reach something above you, use a strong step stool that has a grab bar. Keep electrical cords out of the way. Do not use floor polish or wax that makes floors slippery. If you must use wax, use non-skid floor wax. Do not have throw rugs and other things on the floor that can make you trip. What can I do with my stairs? Do not leave any items on the stairs. Make sure that there are handrails on both sides of the  stairs and use them. Fix handrails that are broken or loose. Make sure that handrails are as long as the stairways. Check any carpeting to make sure that it is firmly attached to the stairs. Fix any carpet that is loose or worn. Avoid having throw rugs at the top or bottom of the stairs. If you do have throw rugs, attach them to the floor with carpet tape. Make sure that you have a light switch at the top of the stairs and the bottom of the stairs. If you do not have them, ask someone to add them for you. What else can I do to help prevent falls? Wear shoes that: Do not have high heels. Have rubber bottoms. Are comfortable and fit you well.  Are closed at the toe. Do not wear sandals. If you use a stepladder: Make sure that it is fully opened. Do not climb a closed stepladder. Make sure that both sides of the stepladder are locked into place. Ask someone to hold it for you, if possible. Clearly mark and make sure that you can see: Any grab bars or handrails. First and last steps. Where the edge of each step is. Use tools that help you move around (mobility aids) if they are needed. These include: Canes. Walkers. Scooters. Crutches. Turn on the lights when you go into a dark area. Replace any light bulbs as soon as they burn out. Set up your furniture so you have a clear path. Avoid moving your furniture around. If any of your floors are uneven, fix them. If there are any pets around you, be aware of where they are. Review your medicines with your doctor. Some medicines can make you feel dizzy. This can increase your chance of falling. Ask your doctor what other things that you can do to help prevent falls. This information is not intended to replace advice given to you by your health care provider. Make sure you discuss any questions you have with your health care provider. Document Released: 12/31/2008 Document Revised: 08/12/2015 Document Reviewed: 04/10/2014 Elsevier Interactive  Patient Education  2017 [employer].

## 2022-02-20 NOTE — Progress Notes (Signed)
Subjective:   Maria [name] is a 66 y.o. female who presents for Medicare Annual (Subsequent) preventive examination. I connected with   [name] on 02/20/22 by a audio enabled telemedicine application and verified that I am speaking with the correct person using two identifiers.  Patient Location: Home  Provider Location: Home Office  I discussed the limitations of evaluation and management by telemedicine. The patient expressed understanding and agreed to proceed.  Review of Systems     Cardiac Risk Factors include: advanced age (>102mn, >>34women);diabetes mellitus;hypertension     Objective:    Today's Vitals   02/20/22 1322  Weight: 121 lb (54.9 kg)  Height: '5\' 2"'$  (1.575 m)   Body mass index is 22.13 kg/m.     02/20/2022    1:26 PM 02/16/2021    1:24 PM 09/04/2018   11:16 AM 08/22/2018    5:28 PM 04/08/2017    4:36 PM 06/22/2014   10:18 AM  Advanced Directives  Does Patient Have a Medical Advance Directive? No No No No No No  Would patient like information on creating a medical advance directive? No - Patient declined No - Patient declined No - Patient declined No - Patient declined  No - patient declined information    Current Medications (verified) Outpatient Encounter Medications as of 02/20/2022  Medication Sig   acetaminophen (TYLENOL) 500 MG tablet Take 1,000 mg by mouth every 6 (six) hours as needed for moderate pain.   albuterol (VENTOLIN HFA) 108 (90 Base) MCG/ACT inhaler Inhale 2 puffs into the lungs every 6 (six) hours as needed for wheezi ng orshortness of breath.   bimatoprost (LUMIGAN) 0.01 % SOLN Place 1 drop into both eyes at bedtime.   cetirizine (ZYRTEC) 10 MG tablet 1 tablet Orally Once a day for 30 day(s)   Cholecalciferol (VITAMIN D3) 50 MCG (2000 UT) capsule 1 tablet   Continuous Blood Gluc Receiver (FREESTYLE LIBRE 2 READER) DEVI Use to test blood sugar continuously. DX: E11.65   Continuous Blood Gluc Sensor (FREESTYLE LIBRE 2 SENSOR) MISC  Use to test blood sugar continuously. DX: E11.65   dapagliflozin propanediol (FARXIGA) 10 MG TABS tablet Take 1 tablet (10 mg total) by mouth daily before breakfast.   Fluticasone-Umeclidin-Vilant (TRELEGY ELLIPTA) 200-62.5-25 MCG/ACT AEPB 1 puff Inhalation Once a day   glipiZIDE (GLUCOTROL XL) 10 MG 24 hr tablet Take 1 tablet (10 mg total) by mouth daily with breakfast.   levothyroxine (SYNTHROID) 75 MCG tablet Take 1 tablet (75 mcg total) by mouth daily.   lisinopril (ZESTRIL) 5 MG tablet TAKE (1) TABLET DAILY FOR HIGH BLOOD PRESSURE.   LUMIGAN 0.01 % SOLN Place 1 drop into both eyes at bedtime.    metFORMIN (GLUCOPHAGE) 1000 MG tablet Take 1 tablet (1,000 mg total) by mouth 2 (two) times daily with a meal.   montelukast (SINGULAIR) 10 MG tablet Take 1 tablet (10 mg total) by mouth daily.   pantoprazole (PROTONIX) 40 MG tablet Take 1 tablet (40 mg total) by mouth daily.   phenylephrine (SUDAFED PE) 10 MG TABS tablet Take 10 mg by mouth every 4 (four) hours as needed (sinus congestion).   PROLIA 60 MG/ML SOSY injection Inject into the skin.   rosuvastatin (CRESTOR) 20 MG tablet TAKE ONE TABLET ONCE DAILY   Suvorexant (BELSOMRA) 5 MG TABS Take 5 mg by mouth at bedtime as needed.   triamcinolone cream (KENALOG) 0.1 % Apply 1 application topically 2 (two) times daily as needed (dry skin).  vitamin B-12 (CYANOCOBALAMIN) 1000 MCG tablet Take 1,000 mcg by mouth daily.   No facility-administered encounter medications on file as of 02/20/2022.    Allergies (verified) Ozempic (0.25 or 0.5 mg-dose) [semaglutide(0.25 or 0.'5mg'$ -dos)]   History: Past Medical History:  Diagnosis Date   Allergy    Asthma    Cancer ([place])    cervical   Complication of anesthesia    " I woke and was short of breath."   CTS (carpal tunnel syndrome)    Diabetes mellitus without complication ([employer])    Early cataracts, bilateral    Fracture    trimalleolar ankle left   GERD (gastroesophageal reflux disease)     Glaucoma    Glaucoma    History of kidney stones    " I passed it"   Hyperlipidemia    Hypertension    Hypothyroidism    Neuropathy    Vitamin D deficiency    Wears glasses    Past Surgical History:  Procedure Laterality Date   ABDOMINAL HYSTERECTOMY     APPENDECTOMY     CARDIAC CATHETERIZATION  2006   negative   COLONOSCOPY  2009   negative   INNER EAR SURGERY Right 1981   left shoulder surgery Left 10/2009   ORIF ANKLE FRACTURE Left 09/05/2018   Procedure: OPEN REDUCTION INTERNAL FIXATION TRIMALLEOLAR FRACTURE POSSIBLE SYNDEMOSIS;  Surgeon: Erle Crocker, MD;  Location: [facility];  Service: Orthopedics;  Laterality: Left;   TONSILLECTOMY     TUBAL LIGATION     Family History  Problem Relation Age of Onset   Diabetes Mother    Prostate cancer Father    Heart disease Father    Heart attack Other    Social History   Socioeconomic History   Marital status: Married    Spouse name: Elta [place]adel[place]   Number of children: 2   Years of education: Not on file   Highest education level: Not on file  Occupational History   Not on file  Tobacco Use   Smoking status: Former    Packs/day: 1.00    Years: 15.00    Total pack years: 15.00    Types: Cigarettes    Quit date: 06/05/2001    Years since quitting: 20.7   Smokeless tobacco: Never  Vaping Use   Vaping Use: Never used  Substance and Sexual Activity   Alcohol use: No   Drug use: No   Sexual activity: Not Currently  Other Topics Concern   Not on file  Social History Narrative   2 sons.   3 grandchildren.   Social Determinants of Health   Financial Resource Strain: Low Risk  (02/20/2022)   Overall Financial Resource Strain (CARDIA)    Difficulty of Paying Living Expenses: Not hard at all  Food Insecurity: No Food Insecurity (02/20/2022)   Hunger Vital Sign    Worried About Running Out of Food in the Last Year: Never true    Ran Out of Food in the Last Year: Never true  Transportation Needs: No Transportation Needs  (02/20/2022)   PRAPARE - Hydrologist (Medical): No    Lack of Transportation (Non-Medical): No  Physical Activity: Insufficiently Active (02/20/2022)   Exercise Vital Sign    Days of Exercise per Week: 3 days    Minutes of Exercise per Session: 30 min  Stress: No Stress Concern Present (02/20/2022)   [place]    Feeling of Stress : Not  at all  Social Connections: Moderately Integrated (02/20/2022)   Social Connection and Isolation Panel [NHANES]    Frequency of Communication with Friends and Family: More than three times a week    Frequency of Social Gatherings with Friends and Family: More than three times a week    Attends Religious Services: 1 to 4 times per year    Active Member of [employer] or Organizations: No    Attends Music therapist: Never    Marital Status: Married    Tobacco Counseling Counseling given: Not Answered   Clinical Intake:  Pre-visit preparation completed: Yes  Pain : No/denies pain     Nutritional Risks: None Diabetes: No  How often do you need to have someone help you when you read instructions, pamphlets, or other written materials from your doctor or pharmacy?: 1 - Never  Diabetic?yes  Nutrition Risk Assessment:  Has the patient had any N/V/D within the last 2 months?  No  Does the patient have any non-healing wounds?  No  Has the patient had any unintentional weight loss or weight gain?  No   Diabetes:  Is the patient diabetic?  Yes  If diabetic, was a CBG obtained today?  No  Did the patient bring in their glucometer from home?  No  How often do you monitor your CBG's? 3 x day .   Financial Strains and Diabetes Management:  Are you having any financial strains with the device, your supplies or your medication? No .  Does the patient want to be seen by Chronic Care Management for management of their diabetes?  No  Would the  patient like to be referred to a Nutritionist or for Diabetic Management?  No   Diabetic Exams:  Diabetic Eye Exam: Completed 12/15/2021 Diabetic Foot Exam: Overdue, Pt has been advised about the importance in completing this exam. Pt is scheduled for diabetic foot exam on next office visit .   Interpreter Needed?: No  Information entered by :: Jadene Pierini, LPN   Activities of Daily Living    02/20/2022    1:26 PM  In your present state of health, do you have any difficulty performing the following activities:  Hearing? 0  Vision? 0  Difficulty concentrating or making decisions? 0  Walking or climbing stairs? 0  Dressing or bathing? 0  Doing errands, shopping? 0  Preparing Food and eating ? N  Using the Toilet? N  In the past six months, have you accidently leaked urine? N  Do you have problems with loss of bowel control? N  Managing your Medications? N  Managing your Finances? N  Housekeeping or managing your Housekeeping? N    Patient Care Team: Baruch Gouty, FNP as PCP - General (Family Medicine) Lavera Guise, [facility] as Pharmacist (Family Medicine)  Indicate any recent Medical Services you may have received from other than [facility] providers in the past year (date may be approximate).     Assessment:   This is a routine wellness examination for Lynasia.  Hearing/Vision screen Vision Screening - Comments:: Wears rx glasses - up to date with routine eye exams with  Dr.hecker   Dietary issues and exercise activities discussed: Current Exercise Habits: Home exercise routine, Type of exercise: walking, Time (Minutes): 30, Frequency (Times/Week): 3, Weekly Exercise (Minutes/Week): 90, Intensity: Mild, Exercise limited by: None identified   Goals Addressed             This Visit's Progress    Have 3 meals  a day   On track    Pt would like continue to eat healthy and maintain blood sugars.        Depression Screen    02/20/2022    1:25 PM 02/01/2022    9:00 AM  10/12/2021    9:00 AM 09/06/2021    9:14 AM 08/03/2021    2:49 PM 06/29/2021    8:48 AM 05/31/2021   12:36 PM  PHQ 2/9 Scores  PHQ - 2 Score 0 0 0 0 0 0 0  PHQ- 9 Score 0 3 0 '3 3 6 8    '$ Fall Risk    02/20/2022    1:24 PM 02/01/2022    9:00 AM 10/12/2021    9:00 AM 10/12/2021    8:47 AM 09/06/2021    9:15 AM  Fall Risk   Falls in the past year? 0 0 0 0 0  Number falls in past yr: 0      Injury with Fall? 0      Risk for fall due to : No Fall Risks      Follow up Falls prevention discussed        Veteran:  Any stairs in or around the home? No  If so, are there any without handrails? No  Home free of loose throw rugs in walkways, pet beds, electrical cords, etc? Yes  Adequate lighting in your home to reduce risk of falls? Yes   ASSISTIVE DEVICES UTILIZED TO PREVENT FALLS:  Life alert? No  Use of a cane, walker or w/c? No  Grab bars in the bathroom? No  Shower chair or bench in shower? No  Elevated toilet seat or a handicapped toilet? No          02/20/2022    1:27 PM 02/16/2021    1:28 PM  6CIT Screen  What Year? 0 points 0 points  What month? 0 points 0 points  What time? 0 points 0 points  Count back from 20 0 points 0 points  Months in reverse 0 points 0 points  Repeat phrase 0 points 0 points  Total Score 0 points 0 points    Immunizations Immunization History  Administered Date(s) Administered   Fluad Quad(high Dose 65+) 12/12/2021   Influenza Split 12/12/2012   Influenza,inj,Quad PF,6+ Mos 01/07/2014, 01/04/2015, 01/13/2016, 12/03/2017, 12/14/2018, 03/02/2020   Influenza-Unspecified 12/18/2020   Pneumococcal-Unspecified 01/12/2009, 12/18/2020   Tdap 06/25/2013   Zoster Recombinat (Shingrix) 06/29/2021, 10/12/2021    TDAP status: Up to date  Flu Vaccine status: Up to date  Pneumococcal vaccine status: Up to date  Covid-19 vaccine status: Completed vaccines  Qualifies for Shingles Vaccine? Yes   Zostavax  completed Yes   Shingrix Completed?: Yes  Screening Tests Health Maintenance  Topic Date Due   COVID-19 Vaccine (1) Never done   Pneumonia Vaccine 59+ Years old (1 - PCV) 06/30/2022 (Originally 02/20/2021)   COLONOSCOPY (Pts 45-51yr Insurance coverage will need to be confirmed)  02/02/2023 (Originally 10/07/2017)   MAMMOGRAM  07/12/2022   HEMOGLOBIN A1C  08/02/2022   FOOT EXAM  10/13/2022   OPHTHALMOLOGY EXAM  12/16/2022   Diabetic kidney evaluation - GFR measurement  02/02/2023   Diabetic kidney evaluation - Urine ACR  02/02/2023   Medicare Annual Wellness (AWV)  02/21/2023   DTaP/Tdap/Td (2 - Td or Tdap) 06/26/2023   INFLUENZA VACCINE  Completed   DEXA SCAN  Completed   Hepatitis C Screening  Completed   Zoster Vaccines-  Shingrix  Completed   HPV VACCINES  Aged Out    Health Maintenance  Health Maintenance Due  Topic Date Due   COVID-19 Vaccine (1) Never done    Colorectal cancer screening: Referral to GI placed patient declines at this time . Pt aware the office will call re: appt.  Mammogram status: Completed 07/11/2021. Repeat every year  Bone Density status: Completed 06/29/2021. Results reflect: Bone density results: OSTEOPENIA. Repeat every 5 years.  Lung Cancer Screening: (Low Dose CT Chest recommended if Age 11-80 years, 30 pack-year currently smoking OR have quit w/in 15years.) does not qualify.   Lung Cancer Screening Referral: n/a  Additional Screening:  Hepatitis C Screening: does not qualify;   Vision Screening: Recommended annual ophthalmology exams for early detection of glaucoma and other disorders of the eye. Is the patient up to date with their annual eye exam?  Yes  Who is the provider or what is the name of the office in which the patient attends annual eye exams? Dr.Hecker  If pt is not established with a provider, would they like to be referred to a provider to establish care? No .   Dental Screening: Recommended annual dental exams for proper  oral hygiene  Community Resource Referral / Chronic Care Management: CRR required this visit?  No   CCM required this visit?  No      Plan:     I have personally reviewed and noted the following in the patient's chart:   Medical and social history Use of alcohol, tobacco or illicit drugs  Current medications and supplements including opioid prescriptions. Patient is not currently taking opioid prescriptions. Functional ability and status Nutritional status Physical activity Advanced directives List of other physicians Hospitalizations, surgeries, and ER visits in previous 12 months Vitals Screenings to include cognitive, depression, and falls Referrals and appointments  In addition, I have reviewed and discussed with patient certain preventive protocols, quality metrics, and best practice recommendations. A written personalized care plan for preventive services as well as general preventive health recommendations were provided to patient.     Daphane Shepherd, LPN   [id number]   Nurse Notes: Due Colonoscopy patient declines at this time

## 2022-02-21 NOTE — Telephone Encounter (Signed)
Spoke to patient 12/4 regarding Prolia injection.  Prolia is to be called into pharmacy.  We will call her to schedule.

## 2022-02-23 ENCOUNTER — Other Ambulatory Visit: Payer: Self-pay | Admitting: Family Medicine

## 2022-02-23 MED ORDER — PROLIA 60 MG/ML ~~LOC~~ SOSY: 60.0000 mg | PREFILLED_SYRINGE | SUBCUTANEOUS | 1 refills | Status: DC

## 2022-02-23 NOTE — Telephone Encounter (Signed)
Prolia sent to pharmacy and patient aware to schedule nurse visit after 12/21

## 2022-03-02 ENCOUNTER — Other Ambulatory Visit: Payer: Self-pay | Admitting: Family Medicine

## 2022-03-08 NOTE — Progress Notes (Shared)
[facility] Note  03/21/2022     CHIEF COMPLAINT Patient presents for No chief complaint on file.    HISTORY OF PRESENT ILLNESS: Maria [name] is a 66 y.o. female who presents to the clinic today for:     Patient states that she woke up in the middle of the night and saw a black blob.   Referring physician: Monna Fam, MD [place], [place] [id number]  HISTORICAL INFORMATION:   Selected notes from the MEDICAL RECORD NUMBER Referred by Dr. Herbert Deaner LEE: 01/28/2020 BCVA: 20/20 OU NPDR OU, PCIOL OU, possible CME OS, POAG OU    CURRENT MEDICATIONS: Current Outpatient Medications (Ophthalmic Drugs)  Medication Sig   bimatoprost (LUMIGAN) 0.01 % SOLN Place 1 drop into both eyes at bedtime.   LUMIGAN 0.01 % SOLN Place 1 drop into both eyes at bedtime.    No current facility-administered medications for this visit. (Ophthalmic Drugs)   Current Outpatient Medications (Other)  Medication Sig   acetaminophen (TYLENOL) 500 MG tablet Take 1,000 mg by mouth every 6 (six) hours as needed for moderate pain.   albuterol (VENTOLIN HFA) 108 (90 Base) MCG/ACT inhaler Inhale 2 puffs into the lungs every 6 (six) hours as needed for wheezi ng orshortness of breath.   cetirizine (ZYRTEC) 10 MG tablet 1 tablet Orally Once a day for 30 day(s)   Cholecalciferol (VITAMIN D3) 50 MCG (2000 UT) capsule 1 tablet   Continuous Blood Gluc Receiver (FREESTYLE LIBRE 2 READER) DEVI Use to test blood sugar continuously. DX: E11.65   Continuous Blood Gluc Sensor (FREESTYLE LIBRE 2 SENSOR) MISC Use to test blood sugar continuously. DX: E11.65   dapagliflozin propanediol (FARXIGA) 10 MG TABS tablet Take 1 tablet (10 mg total) by mouth daily before breakfast.   glipiZIDE (GLUCOTROL XL) 10 MG 24 hr tablet Take 1 tablet (10 mg total) by mouth daily with breakfast.   levothyroxine (SYNTHROID) 75 MCG tablet Take 1 tablet (75 mcg total) by mouth daily.   lisinopril  (ZESTRIL) 5 MG tablet TAKE (1) TABLET DAILY FOR HIGH BLOOD PRESSURE.   metFORMIN (GLUCOPHAGE) 1000 MG tablet Take 1 tablet (1,000 mg total) by mouth 2 (two) times daily with a meal.   montelukast (SINGULAIR) 10 MG tablet Take 1 tablet (10 mg total) by mouth daily.   pantoprazole (PROTONIX) 40 MG tablet Take 1 tablet (40 mg total) by mouth daily.   phenylephrine (SUDAFED PE) 10 MG TABS tablet Take 10 mg by mouth every 4 (four) hours as needed (sinus congestion).   PROLIA 60 MG/ML SOSY injection Inject 60 mg into the skin every 6 (six) months.   rosuvastatin (CRESTOR) 20 MG tablet TAKE ONE TABLET ONCE DAILY   Suvorexant (BELSOMRA) 5 MG TABS Take 5 mg by mouth at bedtime as needed.   TRELEGY ELLIPTA 200-62.5-25 MCG/ACT AEPB INHALE 1 PUFF ONCE DAILY   triamcinolone cream (KENALOG) 0.1 % Apply 1 application topically 2 (two) times daily as needed (dry skin).   vitamin B-12 (CYANOCOBALAMIN) 1000 MCG tablet Take 1,000 mcg by mouth daily.   No current facility-administered medications for this visit. (Other)   REVIEW OF SYSTEMS:   ALLERGIES Allergies  Allergen Reactions   Ozempic (0.25 Or 0.5 Mg-Dose) [Semaglutide(0.25 Or 0.'5mg'$ -Dos)] Nausea And Vomiting    N/v, no energy   PAST MEDICAL HISTORY Past Medical History:  Diagnosis Date   Allergy    Asthma    Cancer ([place])    cervical   Complication  of anesthesia    " I woke and was short of breath."   CTS (carpal tunnel syndrome)    Diabetes mellitus without complication ([place])    Early cataracts, bilateral    Fracture    trimalleolar ankle left   GERD (gastroesophageal reflux disease)    Glaucoma    Glaucoma    History of kidney stones    " I passed it"   Hyperlipidemia    Hypertension    Hypothyroidism    Neuropathy    Vitamin D deficiency    Wears glasses    Past Surgical History:  Procedure Laterality Date   ABDOMINAL HYSTERECTOMY     APPENDECTOMY     CARDIAC CATHETERIZATION  2006   negative   COLONOSCOPY  2009   negative    INNER EAR SURGERY Right 1981   left shoulder surgery Left 10/2009   ORIF ANKLE FRACTURE Left 09/05/2018   Procedure: OPEN REDUCTION INTERNAL FIXATION TRIMALLEOLAR FRACTURE POSSIBLE SYNDEMOSIS;  Surgeon: Erle Crocker, MD;  Location: [facility];  Service: Orthopedics;  Laterality: Left;   TONSILLECTOMY     TUBAL LIGATION     FAMILY HISTORY Family History  Problem Relation Age of Onset   Diabetes Mother    Prostate cancer Father    Heart disease Father    Heart attack Other    SOCIAL HISTORY Social History   Tobacco Use   Smoking status: Former    Packs/day: 1.00    Years: 15.00    Total pack years: 15.00    Types: Cigarettes    Quit date: 06/05/2001    Years since quitting: 20.7   Smokeless tobacco: Never  Vaping Use   Vaping Use: Never used  Substance Use Topics   Alcohol use: No   Drug use: No       OPHTHALMIC EXAM:  Not recorded    IMAGING AND PROCEDURES  Imaging and Procedures for 03/21/2022           ASSESSMENT/PLAN:    ICD-10-CM   1. Moderate nonproliferative diabetic retinopathy of both eyes without macular edema associated with type 2 diabetes mellitus ([place])  A21.3086     2. Essential hypertension  I10     3. Hypertensive retinopathy of both eyes  H35.033     4. Pseudophakia, both eyes  Z96.1     5. Dry eyes  H04.123       1. Moderate nonproliferative diabetic retinopathy, OU  - pt delayed to follow up from 3 months to 12 months (12.08.21-12.19.22) - FA 12.8.21 shows leaking MA, no NV OU  - BCVA today 20/25 OD, 20/20 OS - stable OU - exam w/ scattered MA/DBH OU - Most recent A1C 7.3 (04.12.23) - OCT shows OD: Trace cystic changes/IRF and IRHM nasal macula and fovea-- no reference images to compare; OS: Blunted foveal contour, Trace IRHM - discussed findings, prognosis, potential treatment options and importance of f/u - no retinal or ophthalmic interventions recommended today - advised tighter control of BG and BP - f/u in 4-6 months  -- DFE/OCT  2,3. Hypertensive retinopathy OU - discussed importance of tight BP control - continue to monitor  4. Pseudophakia OU  - s/p CE/IOL (Dr. Herbert Deaner; OS: 08.21, OD: 10.21)  - IOL in good position, doing well  - continue to monitor  5. Dry eyes OU - recommend artificial tears and lubricating ointment as needed   Ophthalmic Meds Ordered this visit:  No orders of the defined types were placed in this  encounter.    No follow-ups on file.  There are no Patient Instructions on file for this visit.   Explained the diagnoses, plan, and follow up with the patient and they expressed understanding.  Patient expressed understanding of the importance of proper follow up care.   This document serves as a record of services personally performed by Gardiner Sleeper, MD, PhD. It was created on their behalf by Renaldo Reel, [place] an ophthalmic technician. The creation of this record is the provider's dictation and/or activities during the visit.    Electronically signed by:  Renaldo Reel, COT  12.20.23  10:24 AM   Gardiner Sleeper, M.D., Ph.D. Diseases & Surgery of the Retina and Vitreous [facility]: M myopia (nearsighted); A astigmatism; H hyperopia (farsighted); P presbyopia; Mrx spectacle prescription;  CTL contact lenses; OD right eye; OS left eye; OU both eyes  XT exotropia; ET esotropia; PEK punctate epithelial keratitis; PEE punctate epithelial erosions; DES dry eye syndrome; MGD meibomian gland dysfunction; ATs artificial tears; PFAT's preservative free artificial tears; [place] nuclear sclerotic cataract; PSC posterior subcapsular cataract; ERM epi-retinal membrane; PVD posterior vitreous detachment; RD retinal detachment; DM diabetes mellitus; DR diabetic retinopathy; NPDR non-proliferative diabetic retinopathy; PDR proliferative diabetic retinopathy; CSME clinically significant macular edema; DME diabetic macular edema; dbh dot blot  hemorrhages; CWS cotton wool spot; POAG primary open angle glaucoma; C/D cup-to-disc ratio; HVF humphrey visual field; GVF goldmann visual field; OCT optical coherence tomography; IOP intraocular pressure; BRVO Branch retinal vein occlusion; CRVO central retinal vein occlusion; CRAO central retinal artery occlusion; BRAO branch retinal artery occlusion; RT retinal tear; SB scleral buckle; PPV pars plana vitrectomy; VH Vitreous hemorrhage; PRP panretinal laser photocoagulation; IVK intravitreal kenalog; VMT vitreomacular traction; MH Macular hole;  NVD neovascularization of the disc; NVE neovascularization elsewhere; AREDS age related eye disease study; ARMD age related macular degeneration; POAG primary open angle glaucoma; EBMD epithelial/anterior basement membrane dystrophy; ACIOL anterior chamber intraocular lens; IOL intraocular lens; PCIOL posterior chamber intraocular lens; Phaco/IOL phacoemulsification with intraocular lens placement; [place] photorefractive keratectomy; LASIK laser assisted in situ keratomileusis; HTN hypertension; DM diabetes mellitus; COPD chronic obstructive pulmonary disease

## 2022-03-09 ENCOUNTER — Ambulatory Visit (INDEPENDENT_AMBULATORY_CARE_PROVIDER_SITE_OTHER): Payer: Medicare Other

## 2022-03-09 DIAGNOSIS — M810 Age-related osteoporosis without current pathological fracture: Secondary | ICD-10-CM | POA: Diagnosis not present

## 2022-03-09 MED ORDER — DENOSUMAB 60 MG/ML ~~LOC~~ SOSY
60.0000 mg | PREFILLED_SYRINGE | Freq: Once | SUBCUTANEOUS | Status: AC
  Administered 2022-03-09: 60 mg via SUBCUTANEOUS

## 2022-03-09 NOTE — Progress Notes (Signed)
Prolia injection given to right upper arm, patient tolerated well. Patient supplied medication.

## 2022-03-16 NOTE — Telephone Encounter (Signed)
Injection given 03/11/22

## 2022-03-17 ENCOUNTER — Ambulatory Visit (INDEPENDENT_AMBULATORY_CARE_PROVIDER_SITE_OTHER): Payer: Medicare Other | Admitting: Family Medicine

## 2022-03-17 ENCOUNTER — Encounter: Payer: Self-pay | Admitting: Family Medicine

## 2022-03-17 VITALS — BP 105/69 | HR 98 | Temp 97.4°F | Ht 62.0 in | Wt 116.2 lb

## 2022-03-17 DIAGNOSIS — R062 Wheezing: Secondary | ICD-10-CM | POA: Diagnosis not present

## 2022-03-17 DIAGNOSIS — J069 Acute upper respiratory infection, unspecified: Secondary | ICD-10-CM

## 2022-03-17 MED ORDER — GUAIFENESIN ER 600 MG PO TB12: 600.0000 mg | ORAL_TABLET | Freq: Two times a day (BID) | ORAL | 0 refills | Status: AC

## 2022-03-17 MED ORDER — AMOXICILLIN-POT CLAVULANATE 875-125 MG PO TABS: 1.0000 | ORAL_TABLET | Freq: Two times a day (BID) | ORAL | 0 refills | Status: AC

## 2022-03-17 MED ORDER — PREDNISONE 20 MG PO TABS: 40.0000 mg | ORAL_TABLET | Freq: Every day | ORAL | 0 refills | Status: AC

## 2022-03-17 NOTE — Progress Notes (Signed)
Subjective:  Patient ID: Maria [name], female    DOB: 03-23-1955, 66 y.o.   MRN: [id number]  Patient Care Team: Baruch Gouty, FNP as PCP - General (Family Medicine) Lavera Guise, [facility] as Pharmacist (Family Medicine)   Chief Complaint:  Nasal Congestion, Cough (Patient had covid before thanksgiving when she had covid but the cough and congestion never went away.  Patient took old rx of prednisone. ), and Ear Pain (Left x 3 days )   HPI: Maria [name] is a 66 y.o. female presenting on 03/17/2022 for Nasal Congestion, Cough (Patient had covid before thanksgiving when she had covid but the cough and congestion never went away.  Patient took old rx of prednisone. ), and Ear Pain (Left x 3 days )   Pt states she had COVID over Thanksgiving and has just not fully recovered. She continues to cough, wheeze, and feel short of breath at times.   Cough This is a recurrent problem. Episode onset: 02/10/2023. The problem has been waxing and waning. The cough is Productive of sputum. Associated symptoms include chills, ear congestion, ear pain, nasal congestion, shortness of breath and wheezing. Pertinent negatives include no chest pain, fever, headaches, heartburn, hemoptysis, myalgias, postnasal drip, rash, rhinorrhea, sore throat, sweats or weight loss. Nothing aggravates the symptoms. She has tried a beta-agonist inhaler, OTC cough suppressant and rest for the symptoms. The treatment provided no relief.    Relevant past medical, surgical, family, and social history reviewed and updated as indicated.  Allergies and medications reviewed and updated. Data reviewed: Chart in Epic.   Past Medical History:  Diagnosis Date   Allergy    Asthma    Cancer ([place])    cervical   Complication of anesthesia    " I woke and was short of breath."   CTS (carpal tunnel syndrome)    Diabetes mellitus without complication ([place])    Early cataracts, bilateral    Fracture    trimalleolar ankle left    GERD (gastroesophageal reflux disease)    Glaucoma    Glaucoma    History of kidney stones    " I passed it"   Hyperlipidemia    Hypertension    Hypothyroidism    Neuropathy    Vitamin D deficiency    Wears glasses     Past Surgical History:  Procedure Laterality Date   ABDOMINAL HYSTERECTOMY     APPENDECTOMY     CARDIAC CATHETERIZATION  2006   negative   COLONOSCOPY  2009   negative   INNER EAR SURGERY Right 1981   left shoulder surgery Left 10/2009   ORIF ANKLE FRACTURE Left 09/05/2018   Procedure: OPEN REDUCTION INTERNAL FIXATION TRIMALLEOLAR FRACTURE POSSIBLE SYNDEMOSIS;  Surgeon: Erle Crocker, MD;  Location: [place];  Service: Orthopedics;  Laterality: Left;   TONSILLECTOMY     TUBAL LIGATION      Social History   Socioeconomic History   Marital status: Married    Spouse name: Elta Guadeloupe   Number of children: 2   Years of education: Not on file   Highest education level: Not on file  Occupational History   Not on file  Tobacco Use   Smoking status: Former    Packs/day: 1.00    Years: 15.00    Total pack years: 15.00    Types: Cigarettes    Quit date: 06/05/2001    Years since quitting: 20.7   Smokeless tobacco: Never  Vaping Use  Vaping Use: Never used  Substance and Sexual Activity   Alcohol use: No   Drug use: No   Sexual activity: Not Currently  Other Topics Concern   Not on file  Social History Narrative   2 sons.   3 grandchildren.   Social Determinants of Health   Financial Resource Strain: Low Risk  (02/20/2022)   Overall Financial Resource Strain (CARDIA)    Difficulty of Paying Living Expenses: Not hard at all  Food Insecurity: No Food Insecurity (02/20/2022)   Hunger Vital Sign    Worried About Running Out of Food in the Last Year: Never true    Ran Out of Food in the Last Year: Never true  Transportation Needs: No Transportation Needs (02/20/2022)   PRAPARE - Hydrologist (Medical): No    Lack of  Transportation (Non-Medical): No  Physical Activity: Insufficiently Active (02/20/2022)   Exercise Vital Sign    Days of Exercise per Week: 3 days    Minutes of Exercise per Session: 30 min  Stress: No Stress Concern Present (02/20/2022)   [place]    Feeling of Stress : Not at all  Social Connections: Moderately Integrated (02/20/2022)   Social Connection and Isolation Panel [NHANES]    Frequency of Communication with Friends and Family: More than three times a week    Frequency of Social Gatherings with Friends and Family: More than three times a week    Attends Religious Services: 1 to 4 times per year    Active Member of [employer] or Organizations: No    Attends Archivist Meetings: Never    Marital Status: Married  Human resources officer Violence: Not At Risk (02/20/2022)   Humiliation, Afraid, Rape, and Kick questionnaire    Fear of Current or Ex-Partner: No    Emotionally Abused: No    Physically Abused: No    Sexually Abused: No    Outpatient Encounter Medications as of 03/17/2022  Medication Sig   acetaminophen (TYLENOL) 500 MG tablet Take 1,000 mg by mouth every 6 (six) hours as needed for moderate pain.   albuterol (VENTOLIN HFA) 108 (90 Base) MCG/ACT inhaler Inhale 2 puffs into the lungs every 6 (six) hours as needed for wheezi ng orshortness of breath.   amoxicillin-clavulanate (AUGMENTIN) 875-125 MG tablet Take 1 tablet by mouth 2 (two) times daily for 10 days.   bimatoprost (LUMIGAN) 0.01 % SOLN Place 1 drop into both eyes at bedtime.   cetirizine (ZYRTEC) 10 MG tablet 1 tablet Orally Once a day for 30 day(s)   Cholecalciferol (VITAMIN D3) 50 MCG (2000 UT) capsule 1 tablet   Continuous Blood Gluc Receiver (FREESTYLE LIBRE 2 READER) DEVI Use to test blood sugar continuously. DX: E11.65   Continuous Blood Gluc Sensor (FREESTYLE LIBRE 2 SENSOR) MISC Use to test blood sugar continuously. DX: E11.65    dapagliflozin propanediol (FARXIGA) 10 MG TABS tablet Take 1 tablet (10 mg total) by mouth daily before breakfast.   glipiZIDE (GLUCOTROL XL) 10 MG 24 hr tablet Take 1 tablet (10 mg total) by mouth daily with breakfast.   guaiFENesin (MUCINEX) 600 MG 12 hr tablet Take 1 tablet (600 mg total) by mouth 2 (two) times daily for 10 days.   levothyroxine (SYNTHROID) 75 MCG tablet Take 1 tablet (75 mcg total) by mouth daily.   lisinopril (ZESTRIL) 5 MG tablet TAKE (1) TABLET DAILY FOR HIGH BLOOD PRESSURE.   LUMIGAN 0.01 % SOLN Place  1 drop into both eyes at bedtime.    metFORMIN (GLUCOPHAGE) 1000 MG tablet Take 1 tablet (1,000 mg total) by mouth 2 (two) times daily with a meal.   montelukast (SINGULAIR) 10 MG tablet Take 1 tablet (10 mg total) by mouth daily.   pantoprazole (PROTONIX) 40 MG tablet Take 1 tablet (40 mg total) by mouth daily.   phenylephrine (SUDAFED PE) 10 MG TABS tablet Take 10 mg by mouth every 4 (four) hours as needed (sinus congestion).   predniSONE (DELTASONE) 20 MG tablet Take 2 tablets (40 mg total) by mouth daily with breakfast for 5 days. 2 po daily for 5 days   PROLIA 60 MG/ML SOSY injection Inject 60 mg into the skin every 6 (six) months.   rosuvastatin (CRESTOR) 20 MG tablet TAKE ONE TABLET ONCE DAILY   Suvorexant (BELSOMRA) 5 MG TABS Take 5 mg by mouth at bedtime as needed.   TRELEGY ELLIPTA 200-62.5-25 MCG/ACT AEPB INHALE 1 PUFF ONCE DAILY   triamcinolone cream (KENALOG) 0.1 % Apply 1 application topically 2 (two) times daily as needed (dry skin).   vitamin B-12 (CYANOCOBALAMIN) 1000 MCG tablet Take 1,000 mcg by mouth daily.   No facility-administered encounter medications on file as of 03/17/2022.    Allergies  Allergen Reactions   Ozempic (0.25 Or 0.5 Mg-Dose) [Semaglutide(0.25 Or 0.62m-Dos)] Nausea And Vomiting    N/v, no energy    Review of Systems  Constitutional:  Positive for activity change, chills and fatigue. Negative for appetite change, diaphoresis,  fever, unexpected weight change and weight loss.  HENT:  Positive for congestion and ear pain. Negative for dental problem, drooling, ear discharge, facial swelling, hearing loss, mouth sores, nosebleeds, postnasal drip, rhinorrhea, sinus pressure, sinus pain, sneezing, sore throat, tinnitus, trouble swallowing and voice change.   Respiratory:  Positive for cough, chest tightness, shortness of breath and wheezing. Negative for apnea, hemoptysis, choking and stridor.   Cardiovascular:  Negative for chest pain, palpitations and leg swelling.  Gastrointestinal:  Negative for abdominal pain, heartburn, nausea and vomiting.  Genitourinary:  Negative for decreased urine volume and difficulty urinating.  Musculoskeletal:  Negative for arthralgias and myalgias.  Skin:  Negative for rash.  Neurological:  Negative for dizziness, tremors, seizures, syncope, facial asymmetry, speech difficulty, weakness, light-headedness and headaches.  Psychiatric/Behavioral:  Negative for confusion.   All other systems reviewed and are negative.       Objective:  BP 105/69   Pulse 98   Temp (!) 97.4 F (36.3 C) (Temporal)   Ht _0  (1.575 m)   Wt 116 lb 3.2 oz (52.7 kg)   SpO2 95%   BMI 21.25 kg/m    Wt Readings from Last 3 Encounters:  03/17/22 116 lb 3.2 oz (52.7 kg)  02/20/22 121 lb (54.9 kg)  02/01/22 121 lb 6.4 oz (55.1 kg)    Physical Exam Vitals and nursing note reviewed.  Constitutional:      General: She is not in acute distress.    Appearance: Normal appearance. She is not ill-appearing, toxic-appearing or diaphoretic.  HENT:     Head: Normocephalic and atraumatic.     Right Ear: A middle ear effusion is present. Tympanic membrane is not erythematous.     Left Ear: A middle ear effusion is present. Tympanic membrane is not erythematous.     Nose: Congestion present.     Mouth/Throat:     Mouth: Mucous membranes are moist.     Pharynx: Posterior oropharyngeal erythema present. No  pharyngeal  swelling, oropharyngeal exudate or uvula swelling.  Eyes:     Conjunctiva/sclera: Conjunctivae normal.     Pupils: Pupils are equal, round, and reactive to light.  Cardiovascular:     Rate and Rhythm: Normal rate and regular rhythm.     Heart sounds: Normal heart sounds.  Pulmonary:     Effort: Pulmonary effort is normal.     Breath sounds: Wheezing (expiratory) present.  Musculoskeletal:     Cervical back: Neck supple.     Right lower leg: No edema.     Left lower leg: No edema.  Lymphadenopathy:     Cervical: No cervical adenopathy.  Skin:    General: Skin is warm and dry.     Capillary Refill: Capillary refill takes less than 2 seconds.  Neurological:     General: No focal deficit present.     Mental Status: She is alert and oriented to person, place, and time.     Motor: No weakness.  Psychiatric:        Mood and Affect: Mood normal.        Behavior: Behavior normal.        Thought Content: Thought content normal.        Judgment: Judgment normal.     Results for orders placed or performed in visit on 02/01/22  Bayer DCA Hb A1c Waived  Result Value Ref Range   HB A1C (BAYER DCA - WAIVED) 7.1 (H) 4.8 - 5.6 %  Thyroid Panel With TSH  Result Value Ref Range   TSH 3.650 0.450 - 4.500 uIU/mL   T4, Total 7.5 4.5 - 12.0 ug/dL   T3 Uptake Ratio 30 24 - 39 %   Free Thyroxine Index 2.3 1.2 - 4.9  Vitamin B12  Result Value Ref Range   Vitamin B-12 730 232 - 1,245 pg/mL  CMP14+EGFR  Result Value Ref Range   Glucose 154 (H) 70 - 99 mg/dL   BUN 11 8 - 27 mg/dL   Creatinine, Ser 0.70 0.57 - 1.00 mg/dL   eGFR 96 >59 mL/min/1.73   BUN/Creatinine Ratio 16 12 - 28   Sodium 142 134 - 144 mmol/L   Potassium 3.7 3.5 - 5.2 mmol/L   Chloride 106 96 - 106 mmol/L   CO2 22 20 - 29 mmol/L   Calcium 9.2 8.7 - 10.3 mg/dL   Total Protein 6.2 6.0 - 8.5 g/dL   Albumin 4.3 3.9 - 4.9 g/dL   Globulin, Total 1.9 1.5 - 4.5 g/dL   Albumin/Globulin Ratio 2.3 (H) 1.2 - 2.2    Bilirubin Total 0.9 0.0 - 1.2 mg/dL   Alkaline Phosphatase 76 44 - 121 IU/L   AST 16 0 - 40 IU/L   ALT 14 0 - 32 IU/L  Microalbumin / creatinine urine ratio  Result Value Ref Range   Creatinine, Urine 69.5 Not Estab. mg/dL   Microalbumin, Urine 19.3 Not Estab. ug/mL   Microalb/Creat Ratio 28 0 - 29 mg/g creat       Pertinent labs & imaging results that were available during my care of the patient were reviewed by me and considered in my medical decision making.  Assessment & Plan:  Ashritha was seen today for nasal congestion, cough and ear pain.  Diagnoses and all orders for this visit:  URI with cough and congestion Wheezing Ongoing and worsening URI symptoms despite symptomatic care at home. Will add prednisone, Mucinex, and Augmentin. Increase water intake. Report new, worsening, or persistent symptoms.  -  amoxicillin-clavulanate (AUGMENTIN) 875-125 MG tablet; Take 1 tablet by mouth 2 (two) times daily for 10 days. -     predniSONE (DELTASONE) 20 MG tablet; Take 2 tablets (40 mg total) by mouth daily with breakfast for 5 days. 2 po daily for 5 days -     guaiFENesin (MUCINEX) 600 MG 12 hr tablet; Take 1 tablet (600 mg total) by mouth 2 (two) times daily for 10 days.     Continue all other maintenance medications.  Follow up plan: Return if symptoms worsen or fail to improve.   Continue healthy lifestyle choices, including diet (rich in fruits, vegetables, and lean proteins, and low in salt and simple carbohydrates) and exercise (at least 30 minutes of moderate physical activity daily).  Educational handout given for URI  The above assessment and management plan was discussed with the patient. The patient verbalized understanding of and has agreed to the management plan. Patient is aware to call the clinic if they develop any new symptoms or if symptoms persist or worsen. Patient is aware when to return to the clinic for a follow-up visit. Patient educated on when it is  appropriate to go to the emergency department.   [name], FNP-C [facility] [phone]

## 2022-03-21 ENCOUNTER — Other Ambulatory Visit: Payer: Self-pay | Admitting: Family Medicine

## 2022-03-21 ENCOUNTER — Encounter (INDEPENDENT_AMBULATORY_CARE_PROVIDER_SITE_OTHER): Payer: Medicare Other | Admitting: Ophthalmology

## 2022-03-21 DIAGNOSIS — H35033 Hypertensive retinopathy, bilateral: Secondary | ICD-10-CM

## 2022-03-21 DIAGNOSIS — E113393 Type 2 diabetes mellitus with moderate nonproliferative diabetic retinopathy without macular edema, bilateral: Secondary | ICD-10-CM

## 2022-03-21 DIAGNOSIS — Z961 Presence of intraocular lens: Secondary | ICD-10-CM

## 2022-03-21 DIAGNOSIS — E1169 Type 2 diabetes mellitus with other specified complication: Secondary | ICD-10-CM

## 2022-03-21 DIAGNOSIS — I10 Essential (primary) hypertension: Secondary | ICD-10-CM

## 2022-03-21 DIAGNOSIS — H04123 Dry eye syndrome of bilateral lacrimal glands: Secondary | ICD-10-CM

## 2022-03-24 ENCOUNTER — Telehealth: Payer: Self-pay | Admitting: Family Medicine

## 2022-03-24 NOTE — Telephone Encounter (Signed)
Pt called stating that she had a visit with PCP last week and was prescribed some medicine to take. Says the medicine has helped except her head is still so congested that it makes her nauseous. Says she is still taking antibiotic prescribed to her but needs advise on what else she can do.

## 2022-03-24 NOTE — Telephone Encounter (Signed)
Patient aware and verbalizes understanding. 

## 2022-03-30 NOTE — Progress Notes (Shared)
[facility] Note  04/05/2022     CHIEF COMPLAINT Patient presents for No chief complaint on file.    HISTORY OF PRESENT ILLNESS: Maria [name] is a 67 y.o. female who presents to the clinic today for:      Referring physician: Monna Fam, MD [place], [place] [address]  HISTORICAL INFORMATION:   Selected notes from the MEDICAL RECORD NUMBER Referred by Dr. Herbert Deaner LEE: 01/28/2020 BCVA: 20/20 OU NPDR OU, PCIOL OU, possible CME OS, POAG OU    CURRENT MEDICATIONS: Current Outpatient Medications (Ophthalmic Drugs)  Medication Sig   bimatoprost (LUMIGAN) 0.01 % SOLN Place 1 drop into both eyes at bedtime.   LUMIGAN 0.01 % SOLN Place 1 drop into both eyes at bedtime.    No current facility-administered medications for this visit. (Ophthalmic Drugs)   Current Outpatient Medications (Other)  Medication Sig   acetaminophen (TYLENOL) 500 MG tablet Take 1,000 mg by mouth every 6 (six) hours as needed for moderate pain.   albuterol (VENTOLIN HFA) 108 (90 Base) MCG/ACT inhaler Inhale 2 puffs into the lungs every 6 (six) hours as needed for wheezi ng orshortness of breath.   cetirizine (ZYRTEC) 10 MG tablet 1 tablet Orally Once a day for 30 day(s)   Cholecalciferol (VITAMIN D3) 50 MCG (2000 UT) capsule 1 tablet   Continuous Blood Gluc Receiver (FREESTYLE LIBRE 2 READER) DEVI Use to test blood sugar continuously. DX: E11.65   Continuous Blood Gluc Sensor (FREESTYLE LIBRE 2 SENSOR) MISC USE TO TEST BLOOD SUGAR CONTINUOUSLY   dapagliflozin propanediol (FARXIGA) 10 MG TABS tablet Take 1 tablet (10 mg total) by mouth daily before breakfast.   glipiZIDE (GLUCOTROL XL) 10 MG 24 hr tablet Take 1 tablet (10 mg total) by mouth daily with breakfast.   levothyroxine (SYNTHROID) 75 MCG tablet Take 1 tablet (75 mcg total) by mouth daily.   lisinopril (ZESTRIL) 5 MG tablet TAKE ONE TABLET DAILY FOR HIGH BLOOD PRESSURE   metFORMIN (GLUCOPHAGE)  1000 MG tablet Take 1 tablet (1,000 mg total) by mouth 2 (two) times daily with a meal.   montelukast (SINGULAIR) 10 MG tablet Take 1 tablet (10 mg total) by mouth daily.   pantoprazole (PROTONIX) 40 MG tablet Take 1 tablet (40 mg total) by mouth daily.   phenylephrine (SUDAFED PE) 10 MG TABS tablet Take 10 mg by mouth every 4 (four) hours as needed (sinus congestion).   PROLIA 60 MG/ML SOSY injection Inject 60 mg into the skin every 6 (six) months.   rosuvastatin (CRESTOR) 20 MG tablet TAKE ONE TABLET ONCE DAILY   Suvorexant (BELSOMRA) 5 MG TABS Take 5 mg by mouth at bedtime as needed.   TRELEGY ELLIPTA 200-62.5-25 MCG/ACT AEPB INHALE 1 PUFF ONCE DAILY   triamcinolone cream (KENALOG) 0.1 % Apply 1 application topically 2 (two) times daily as needed (dry skin).   vitamin B-12 (CYANOCOBALAMIN) 1000 MCG tablet Take 1,000 mcg by mouth daily.   No current facility-administered medications for this visit. (Other)   REVIEW OF SYSTEMS:   ALLERGIES Allergies  Allergen Reactions   Ozempic (0.25 Or 0.5 Mg-Dose) [Semaglutide(0.25 Or 0.'5mg'$ -Dos)] Nausea And Vomiting    N/v, no energy   PAST MEDICAL HISTORY Past Medical History:  Diagnosis Date   Allergy    Asthma    Cancer ([place])    cervical   Complication of anesthesia    " I woke and was short of breath."   CTS (carpal tunnel syndrome)  Diabetes mellitus without complication ([place])    Early cataracts, bilateral    Fracture    trimalleolar ankle left   GERD (gastroesophageal reflux disease)    Glaucoma    Glaucoma    History of kidney stones    " I passed it"   Hyperlipidemia    Hypertension    Hypothyroidism    Neuropathy    Vitamin D deficiency    Wears glasses    Past Surgical History:  Procedure Laterality Date   ABDOMINAL HYSTERECTOMY     APPENDECTOMY     CARDIAC CATHETERIZATION  2006   negative   COLONOSCOPY  2009   negative   INNER EAR SURGERY Right 1981   left shoulder surgery Left 10/2009   ORIF ANKLE FRACTURE  Left 09/05/2018   Procedure: OPEN REDUCTION INTERNAL FIXATION TRIMALLEOLAR FRACTURE POSSIBLE SYNDEMOSIS;  Surgeon: Erle Crocker, MD;  Location: [place];  Service: Orthopedics;  Laterality: Left;   TONSILLECTOMY     TUBAL LIGATION     FAMILY HISTORY Family History  Problem Relation Age of Onset   Diabetes Mother    Prostate cancer Father    Heart disease Father    Heart attack Other    SOCIAL HISTORY Social History   Tobacco Use   Smoking status: Former    Packs/day: 1.00    Years: 15.00    Total pack years: 15.00    Types: Cigarettes    Quit date: 06/05/2001    Years since quitting: 20.8   Smokeless tobacco: Never  Vaping Use   Vaping Use: Never used  Substance Use Topics   Alcohol use: No   Drug use: No       OPHTHALMIC EXAM:  Not recorded    IMAGING AND PROCEDURES  Imaging and Procedures for 04/05/2022           ASSESSMENT/PLAN:  No diagnosis found.  1. Moderate nonproliferative diabetic retinopathy, OU  - pt delayed to follow up from 3 months to 12 months (12.08.21-12.19.22) - FA 12.8.21 shows leaking MA, no NV OU  - BCVA today 20/25 OD, 20/20 OS - stable OU - exam w/ scattered MA/DBH OU - Most recent A1C 7.3 (04.12.23) - OCT shows OD: Trace cystic changes/IRF and IRHM nasal macula and fovea-- no reference images to compare; OS: Blunted foveal contour, Trace IRHM - discussed findings, prognosis, potential treatment options and importance of f/u - no retinal or ophthalmic interventions recommended today - advised tighter control of BG and BP - f/u in 4-6 months -- DFE/OCT  2,3. Hypertensive retinopathy OU - discussed importance of tight BP control - continue to monitor  4. Pseudophakia OU  - s/p CE/IOL (Dr. Herbert Deaner; OS: 08.21, OD: 10.21)  - IOL in good position, doing well  - continue to monitor  5. Dry eyes OU - recommend artificial tears and lubricating ointment as needed   Ophthalmic Meds Ordered this visit:  No orders of the defined  types were placed in this encounter.    No follow-ups on file.  There are no Patient Instructions on file for this visit.   Explained the diagnoses, plan, and follow up with the patient and they expressed understanding.  Patient expressed understanding of the importance of proper follow up care.   This document serves as a record of services personally performed by Gardiner Sleeper, MD, PhD. It was created on their behalf by Orvan Falconer, an ophthalmic technician. The creation of this record is the provider's dictation and/or activities during  the visit.    Electronically signed by: Orvan Falconer, OA, 03/30/22  10:15 AM    Gardiner Sleeper, M.D., Ph.D. Diseases & Surgery of the Retina and Vitreous [facility]: M myopia (nearsighted); A astigmatism; H hyperopia (farsighted); P presbyopia; Mrx spectacle prescription;  CTL contact lenses; OD right eye; OS left eye; OU both eyes  XT exotropia; ET esotropia; PEK punctate epithelial keratitis; PEE punctate epithelial erosions; DES dry eye syndrome; MGD meibomian gland dysfunction; ATs artificial tears; PFAT's preservative free artificial tears; Delta nuclear sclerotic cataract; PSC posterior subcapsular cataract; ERM epi-retinal membrane; PVD posterior vitreous detachment; RD retinal detachment; DM diabetes mellitus; DR diabetic retinopathy; NPDR non-proliferative diabetic retinopathy; PDR proliferative diabetic retinopathy; CSME clinically significant macular edema; DME diabetic macular edema; dbh dot blot hemorrhages; CWS cotton wool spot; POAG primary open angle glaucoma; C/D cup-to-disc ratio; HVF humphrey visual field; GVF goldmann visual field; OCT optical coherence tomography; IOP intraocular pressure; BRVO Branch retinal vein occlusion; CRVO central retinal vein occlusion; CRAO central retinal artery occlusion; BRAO branch retinal artery occlusion; RT retinal tear; SB scleral buckle; PPV pars plana  vitrectomy; VH Vitreous hemorrhage; PRP panretinal laser photocoagulation; IVK intravitreal kenalog; VMT vitreomacular traction; MH Macular hole;  NVD neovascularization of the disc; NVE neovascularization elsewhere; AREDS age related eye disease study; ARMD age related macular degeneration; POAG primary open angle glaucoma; EBMD epithelial/anterior basement membrane dystrophy; ACIOL anterior chamber intraocular lens; IOL intraocular lens; PCIOL posterior chamber intraocular lens; Phaco/IOL phacoemulsification with intraocular lens placement; [place] photorefractive keratectomy; LASIK laser assisted in situ keratomileusis; HTN hypertension; DM diabetes mellitus; COPD chronic obstructive pulmonary disease

## 2022-04-05 ENCOUNTER — Encounter (INDEPENDENT_AMBULATORY_CARE_PROVIDER_SITE_OTHER): Payer: Medicare Other | Admitting: Ophthalmology

## 2022-04-05 ENCOUNTER — Encounter (INDEPENDENT_AMBULATORY_CARE_PROVIDER_SITE_OTHER): Payer: Self-pay

## 2022-04-05 DIAGNOSIS — I10 Essential (primary) hypertension: Secondary | ICD-10-CM

## 2022-04-05 DIAGNOSIS — Z961 Presence of intraocular lens: Secondary | ICD-10-CM

## 2022-04-05 DIAGNOSIS — H35033 Hypertensive retinopathy, bilateral: Secondary | ICD-10-CM

## 2022-04-05 DIAGNOSIS — E113393 Type 2 diabetes mellitus with moderate nonproliferative diabetic retinopathy without macular edema, bilateral: Secondary | ICD-10-CM

## 2022-04-05 DIAGNOSIS — E113311 Type 2 diabetes mellitus with moderate nonproliferative diabetic retinopathy with macular edema, right eye: Secondary | ICD-10-CM

## 2022-04-05 DIAGNOSIS — H04123 Dry eye syndrome of bilateral lacrimal glands: Secondary | ICD-10-CM

## 2022-04-05 NOTE — Progress Notes (Signed)
[facility] Note  04/10/2022     CHIEF COMPLAINT Patient presents for Retina Follow Up    HISTORY OF PRESENT ILLNESS: Maria [name] is a 67 y.o. female who presents to the clinic today for:   HPI     Retina Follow Up   Patient presents with  Diabetic Retinopathy.  In both eyes.  Severity is moderate.  Duration of 6 months.  Since onset it is stable.  I, the attending physician,  performed the HPI with the patient and updated documentation appropriately.        Comments   Pt here for 6 wk ret f/u NPDR OU. Pt states VA the same, sugar levels have been irregular recently. She has been sick the last few months-had COVID during Thanksgiving and resp infection since Christmas. Sugar levels running high due to meds she's been given. Last A1C was 7.1 in Nov 2023.       Last edited by Bernarda Caffey, MD on 04/11/2022 10:53 PM.    Patient states she has covid right before thanksgiving, then had an upper respiratory infection at Christmas  Referring physician: Monna Fam, MD [place], [place] [address]  HISTORICAL INFORMATION:   Selected notes from the MEDICAL RECORD NUMBER Referred by Dr. Herbert Deaner LEE: 01/28/2020 BCVA: 20/20 OU NPDR OU, PCIOL OU, possible CME OS, POAG OU    CURRENT MEDICATIONS: Current Outpatient Medications (Ophthalmic Drugs)  Medication Sig   bimatoprost (LUMIGAN) 0.01 % SOLN Place 1 drop into both eyes at bedtime.   LUMIGAN 0.01 % SOLN Place 1 drop into both eyes at bedtime.    No current facility-administered medications for this visit. (Ophthalmic Drugs)   Current Outpatient Medications (Other)  Medication Sig   acetaminophen (TYLENOL) 500 MG tablet Take 1,000 mg by mouth every 6 (six) hours as needed for moderate pain.   albuterol (VENTOLIN HFA) 108 (90 Base) MCG/ACT inhaler Inhale 2 puffs into the lungs every 6 (six) hours as needed for wheezi ng orshortness of breath.   cetirizine (ZYRTEC) 10 MG  tablet 1 tablet Orally Once a day for 30 day(s)   Cholecalciferol (VITAMIN D3) 50 MCG (2000 UT) capsule 1 tablet   Continuous Blood Gluc Receiver (FREESTYLE LIBRE 2 READER) DEVI Use to test blood sugar continuously. DX: E11.65   Continuous Blood Gluc Sensor (FREESTYLE LIBRE 2 SENSOR) MISC USE TO TEST BLOOD SUGAR CONTINUOUSLY   dapagliflozin propanediol (FARXIGA) 10 MG TABS tablet Take 1 tablet (10 mg total) by mouth daily before breakfast.   glipiZIDE (GLUCOTROL XL) 10 MG 24 hr tablet Take 1 tablet (10 mg total) by mouth daily with breakfast.   levothyroxine (SYNTHROID) 75 MCG tablet Take 1 tablet (75 mcg total) by mouth daily.   lisinopril (ZESTRIL) 5 MG tablet TAKE ONE TABLET DAILY FOR HIGH BLOOD PRESSURE   metFORMIN (GLUCOPHAGE) 1000 MG tablet Take 1 tablet (1,000 mg total) by mouth 2 (two) times daily with a meal.   montelukast (SINGULAIR) 10 MG tablet Take 1 tablet (10 mg total) by mouth daily.   pantoprazole (PROTONIX) 40 MG tablet Take 1 tablet (40 mg total) by mouth daily.   phenylephrine (SUDAFED PE) 10 MG TABS tablet Take 10 mg by mouth every 4 (four) hours as needed (sinus congestion).   PROLIA 60 MG/ML SOSY injection Inject 60 mg into the skin every 6 (six) months.   rosuvastatin (CRESTOR) 20 MG tablet Take 1 tablet (20 mg total) by mouth daily.   Suvorexant (  BELSOMRA) 5 MG TABS Take 5 mg by mouth at bedtime as needed.   TRELEGY ELLIPTA 200-62.5-25 MCG/ACT AEPB INHALE 1 PUFF ONCE DAILY   triamcinolone cream (KENALOG) 0.1 % Apply 1 application topically 2 (two) times daily as needed (dry skin).   vitamin B-12 (CYANOCOBALAMIN) 1000 MCG tablet Take 1,000 mcg by mouth daily.   No current facility-administered medications for this visit. (Other)   REVIEW OF SYSTEMS: ROS   Positive for: Endocrine, Eyes, Psychiatric Negative for: Constitutional, Gastrointestinal, Neurological, Skin, Genitourinary, Musculoskeletal, HENT, Cardiovascular, Respiratory, Allergic/Imm, Heme/Lymph Last edited  by Kingsley Spittle, COT on 04/10/2022  9:40 AM.     ALLERGIES Allergies  Allergen Reactions   Ozempic (0.25 Or 0.5 Mg-Dose) [Semaglutide(0.25 Or 0.46m-Dos)] Nausea And Vomiting    N/v, no energy   PAST MEDICAL HISTORY Past Medical History:  Diagnosis Date   Allergy    Asthma    Cancer ([place]    cervical   Complication of anesthesia    " I woke and was short of breath."   CTS (carpal tunnel syndrome)    Diabetes mellitus without complication ([place]    Early cataracts, bilateral    Fracture    trimalleolar ankle left   GERD (gastroesophageal reflux disease)    Glaucoma    Glaucoma    History of kidney stones    " I passed it"   Hyperlipidemia    Hypertension    Hypothyroidism    Neuropathy    Vitamin D deficiency    Wears glasses    Past Surgical History:  Procedure Laterality Date   ABDOMINAL HYSTERECTOMY     APPENDECTOMY     CARDIAC CATHETERIZATION  2006   negative   COLONOSCOPY  2009   negative   INNER EAR SURGERY Right 1981   left shoulder surgery Left 10/2009   ORIF ANKLE FRACTURE Left 09/05/2018   Procedure: OPEN REDUCTION INTERNAL FIXATION TRIMALLEOLAR FRACTURE POSSIBLE SYNDEMOSIS;  Surgeon: AErle Crocker MD;  [facility]  Service: Orthopedics;  Laterality: Left;   TONSILLECTOMY     TUBAL LIGATION     FAMILY HISTORY Family History  Problem Relation Age of Onset   Diabetes Mother    Prostate cancer Father    Heart disease Father    Heart attack Other    SOCIAL HISTORY Social History   Tobacco Use   Smoking status: Former    Packs/day: 1.00    Years: 15.00    Total pack years: 15.00    Types: Cigarettes    Quit date: 06/05/2001    Years since quitting: 20.8   Smokeless tobacco: Never  Vaping Use   Vaping Use: Never used  Substance Use Topics   Alcohol use: No   Drug use: No       OPHTHALMIC EXAM:  Base Eye Exam     Visual Acuity (Snellen - Linear)       Right Left   Dist cc 20/20 -1 20/25   Dist ph cc  NI     Correction: Glasses         Tonometry (Tonopen, 9:45 AM)       Right Left   Pressure 11 12         Pupils       Pupils Dark Light Shape React APD   Right PERRL 4 3 Round Brisk None   Left PERRL 4 3 Round Brisk None         Visual Fields (Counting fingers)  Left Right    Full Full         Extraocular Movement       Right Left    Full, Ortho Full, Ortho         Neuro/Psych     Oriented x3: Yes   Mood/Affect: Normal         Dilation     Both eyes: 1.0% Mydriacyl, 2.5% Phenylephrine @ 9:46 AM           Slit Lamp and Fundus Exam     Slit Lamp Exam       Right Left   Lids/Lashes Dermatochalasis - upper lid, Meibomian gland dysfunction, Telangiectasia Dermatochalasis - upper lid, Meibomian gland dysfunction, Telangiectasia   Conjunctiva/Sclera White and quiet White and quiet   Cornea 1+ Punctate epithelial erosions, arcus, well healed temporal cataract wounds, tear film debris 1+ inferior Punctate epithelial erosions, arcus, well healed temporal cataract wounds   Anterior Chamber Deep and quiet Deep and quiet   Iris Round and dilated, No NVI Round and dilated, No NVI   Lens PC IOL in good position, 1+ Posterior capsular opacification PC IOL in good position, trace Posterior capsular opacification   Anterior Vitreous mild syneresis Vitreous syneresis         Fundus Exam       Right Left   Disc Mild Pallor, Sharp rim, +cupping Mild Pallor, Sharp rim, temporal PPA   C/D Ratio 0.7 0.5   Macula Flat, Blunted foveal reflex, mild RPE mottling and clumping, scattered MA's -- greatest superior mac, focal cystic changes nasal fovea -- improved Flat, Blunted foveal reflex, mild RPE mottling and clumping, scattered MA/DBH, focal cluster ST to fovea, no frank edema   Vessels attenuated, Tortuous mild attenuation, mild tortuosity   Periphery Attached, rare MA Attached, rare MA/DBH greatest nasal to disc           Refraction     Wearing Rx        Sphere Cylinder Axis Add   Right -0.25 +1.00 016 +2.75   Left -1.00 Sphere  +2.75           IMAGING AND PROCEDURES  Imaging and Procedures for 04/10/2022  OCT, Retina - OU - Both Eyes       Right Eye Quality was good. Central Foveal Thickness: 295. Progression has improved. Findings include normal foveal contour, no IRF, no SRF, intraretinal hyper-reflective material, vitreomacular adhesion (Blunted foveal contour, interval improvement in cystic changes).   Left Eye Quality was good. Central Foveal Thickness: 298. Progression has been stable. Findings include normal foveal contour, no IRF, no SRF, retinal drusen (Blunted foveal contour, rare drusen).   Notes *Images captured and stored on drive  Diagnosis / Impression:  OD: Blunted foveal contour, interval improvement in cystic changes OS: Blunted foveal contour, rare drusen  Clinical management:  See below  Abbreviations: NFP - Normal foveal profile. CME - cystoid macular edema. PED - pigment epithelial detachment. IRF - intraretinal fluid. SRF - subretinal fluid. EZ - ellipsoid zone. ERM - epiretinal membrane. ORA - outer retinal atrophy. ORT - outer retinal tubulation. SRHM - subretinal hyper-reflective material. IRHM - intraretinal hyper-reflective material            ASSESSMENT/PLAN:    ICD-10-CM   1. Moderate nonproliferative diabetic retinopathy of both eyes without macular edema associated with type 2 diabetes mellitus (HCC)  T61.4431 OCT, Retina - OU - Both Eyes    2. Essential hypertension  I10  3. Hypertensive retinopathy of both eyes  H35.033     4. Pseudophakia, both eyes  Z96.1     5. Dry eyes  H04.123      1. Moderate nonproliferative diabetic retinopathy, OU - Most recent A1C 7.1 (11.15.23)  - pt delayed to follow up from 3 months to 12 months (12.08.21-12.19.22) - FA 12.8.21 shows leaking MA, no NV OU  - BCVA today 20/20 OD, 20/25 OS - stable OU - exam w/ scattered MA/DBH OU - OCT shows  OD: Blunted foveal contour, interval improvement in cystic changes; OS: Blunted foveal contour, rare drusen - discussed findings, prognosis, potential treatment options and importance of f/u - no retinal or ophthalmic interventions indicated or recommended  - f/u in 6 months -- DFE/OCT  2,3. Hypertensive retinopathy OU - discussed importance of tight BP control - continue to monitor  4. Pseudophakia OU  - s/p CE/IOL (Dr. Herbert Deaner; OS: 08.21, OD: 10.21)  - IOL in good position, doing well  - continue to monitor  5. Dry eyes OU - recommend artificial tears and lubricating ointment as needed   Ophthalmic Meds Ordered this visit:  No orders of the defined types were placed in this encounter.    Return in about 6 months (around 10/09/2022) for f/u NPDR OU, DFE, OCT.  There are no Patient Instructions on file for this visit.   Explained the diagnoses, plan, and follow up with the patient and they expressed understanding.  Patient expressed understanding of the importance of proper follow up care.   This document serves as a record of services personally performed by Gardiner Sleeper, MD, PhD. It was created on their behalf by Roselee Nova, COMT. The creation of this record is the provider's dictation and/or activities during the visit.  Electronically signed by: Roselee Nova, COMT 04/11/22 10:53 PM  This document serves as a record of services personally performed by Gardiner Sleeper, MD, PhD. It was created on their behalf by San Jetty. Owens Shark, OA an ophthalmic technician. The creation of this record is the provider's dictation and/or activities during the visit.    Electronically signed by: San Jetty. Marguerita Merles 01.22.2024 10:53 PM  Gardiner Sleeper, M.D., Ph.D. Diseases & Surgery of the Retina and Vitreous [facility]  I have reviewed the above documentation for accuracy and completeness, and I agree with the above. Gardiner Sleeper, M.D., Ph.D. 04/11/22 10:57  PM  Abbreviations: M myopia (nearsighted); A astigmatism; H hyperopia (farsighted); P presbyopia; Mrx spectacle prescription;  CTL contact lenses; OD right eye; OS left eye; OU both eyes  XT exotropia; ET esotropia; PEK punctate epithelial keratitis; PEE punctate epithelial erosions; DES dry eye syndrome; MGD meibomian gland dysfunction; ATs artificial tears; PFAT's preservative free artificial tears; [place] nuclear sclerotic cataract; PSC posterior subcapsular cataract; ERM epi-retinal membrane; PVD posterior vitreous detachment; RD retinal detachment; DM diabetes mellitus; DR diabetic retinopathy; NPDR non-proliferative diabetic retinopathy; PDR proliferative diabetic retinopathy; CSME clinically significant macular edema; DME diabetic macular edema; dbh dot blot hemorrhages; CWS cotton wool spot; POAG primary open angle glaucoma; C/D cup-to-disc ratio; HVF humphrey visual field; GVF goldmann visual field; OCT optical coherence tomography; IOP intraocular pressure; BRVO Branch retinal vein occlusion; CRVO central retinal vein occlusion; CRAO central retinal artery occlusion; BRAO branch retinal artery occlusion; RT retinal tear; SB scleral buckle; PPV pars plana vitrectomy; VH Vitreous hemorrhage; PRP panretinal laser photocoagulation; IVK intravitreal kenalog; VMT vitreomacular traction; MH Macular hole;  NVD neovascularization of the disc; NVE  neovascularization elsewhere; AREDS age related eye disease study; ARMD age related macular degeneration; POAG primary open angle glaucoma; EBMD epithelial/anterior basement membrane dystrophy; ACIOL anterior chamber intraocular lens; IOL intraocular lens; PCIOL posterior chamber intraocular lens; Phaco/IOL phacoemulsification with intraocular lens placement; [place] photorefractive keratectomy; LASIK laser assisted in situ keratomileusis; HTN hypertension; DM diabetes mellitus; COPD chronic obstructive pulmonary disease

## 2022-04-07 ENCOUNTER — Other Ambulatory Visit: Payer: Self-pay | Admitting: *Deleted

## 2022-04-07 DIAGNOSIS — E1169 Type 2 diabetes mellitus with other specified complication: Secondary | ICD-10-CM

## 2022-04-07 MED ORDER — ROSUVASTATIN CALCIUM 20 MG PO TABS: 20.0000 mg | ORAL_TABLET | Freq: Every day | ORAL | 0 refills | Status: DC

## 2022-04-10 ENCOUNTER — Ambulatory Visit (INDEPENDENT_AMBULATORY_CARE_PROVIDER_SITE_OTHER): Payer: Medicare Other | Admitting: Ophthalmology

## 2022-04-10 ENCOUNTER — Encounter (INDEPENDENT_AMBULATORY_CARE_PROVIDER_SITE_OTHER): Payer: Self-pay | Admitting: Ophthalmology

## 2022-04-10 DIAGNOSIS — Z961 Presence of intraocular lens: Secondary | ICD-10-CM

## 2022-04-10 DIAGNOSIS — H04123 Dry eye syndrome of bilateral lacrimal glands: Secondary | ICD-10-CM

## 2022-04-10 DIAGNOSIS — I10 Essential (primary) hypertension: Secondary | ICD-10-CM

## 2022-04-10 DIAGNOSIS — H35033 Hypertensive retinopathy, bilateral: Secondary | ICD-10-CM | POA: Diagnosis not present

## 2022-04-10 DIAGNOSIS — E113393 Type 2 diabetes mellitus with moderate nonproliferative diabetic retinopathy without macular edema, bilateral: Secondary | ICD-10-CM | POA: Diagnosis not present

## 2022-04-11 ENCOUNTER — Encounter (INDEPENDENT_AMBULATORY_CARE_PROVIDER_SITE_OTHER): Payer: Self-pay | Admitting: Ophthalmology

## 2022-04-18 ENCOUNTER — Other Ambulatory Visit: Payer: Self-pay | Admitting: Family Medicine

## 2022-04-18 DIAGNOSIS — E039 Hypothyroidism, unspecified: Secondary | ICD-10-CM

## 2022-04-18 DIAGNOSIS — E1169 Type 2 diabetes mellitus with other specified complication: Secondary | ICD-10-CM

## 2022-05-04 ENCOUNTER — Encounter: Payer: Self-pay | Admitting: Family Medicine

## 2022-05-04 ENCOUNTER — Ambulatory Visit (INDEPENDENT_AMBULATORY_CARE_PROVIDER_SITE_OTHER): Payer: Medicare Other | Admitting: Family Medicine

## 2022-05-04 VITALS — BP 119/65 | HR 87 | Temp 97.4°F | Ht 62.0 in | Wt 113.8 lb

## 2022-05-04 DIAGNOSIS — E1159 Type 2 diabetes mellitus with other circulatory complications: Secondary | ICD-10-CM

## 2022-05-04 DIAGNOSIS — E1169 Type 2 diabetes mellitus with other specified complication: Secondary | ICD-10-CM | POA: Diagnosis not present

## 2022-05-04 DIAGNOSIS — Z794 Long term (current) use of insulin: Secondary | ICD-10-CM

## 2022-05-04 DIAGNOSIS — Z79899 Other long term (current) drug therapy: Secondary | ICD-10-CM | POA: Diagnosis not present

## 2022-05-04 DIAGNOSIS — F5101 Primary insomnia: Secondary | ICD-10-CM

## 2022-05-04 DIAGNOSIS — R110 Nausea: Secondary | ICD-10-CM | POA: Diagnosis not present

## 2022-05-04 DIAGNOSIS — I152 Hypertension secondary to endocrine disorders: Secondary | ICD-10-CM | POA: Diagnosis not present

## 2022-05-04 DIAGNOSIS — E039 Hypothyroidism, unspecified: Secondary | ICD-10-CM

## 2022-05-04 LAB — BAYER DCA HB A1C WAIVED: HB A1C (BAYER DCA - WAIVED): 9.1 % — ABNORMAL HIGH (ref 4.8–5.6)

## 2022-05-04 MED ORDER — ONDANSETRON HCL 4 MG PO TABS: 4.0000 mg | ORAL_TABLET | Freq: Three times a day (TID) | ORAL | 0 refills | Status: DC | PRN

## 2022-05-04 NOTE — Progress Notes (Signed)
Subjective:  Patient ID: Maria [name], female    DOB: 1956-02-09, 67 y.o.   MRN: [id number]  Patient Care Team: Baruch Gouty, FNP as PCP - General (Family Medicine) Lavera Guise, [facility] as Pharmacist (Family Medicine)   Chief Complaint:  Diabetes (3 month follow up )   HPI: Maria [name] is a 67 y.o. female presenting on 05/04/2022 for Diabetes (3 month follow up )   1. Type 2 diabetes mellitus with other specified complication, with long-term current use of insulin (HCC) Has been taking her medications as prescribed but reports her blood sugars have been elevated over the last several weeks. She denies significant dietary changes. No polyuria, polyphagia, or polydipsia. She has been having some nausea with her metformin at times. No vomiting.   2. Hypertension associated with type 2 diabetes mellitus ([place]) Compliant with medications. Does watch salt intake. Denies chest pain, leg swelling, headaches, weakness, confusion, or visual changes.   3. Acquired hypothyroidism Has been well controlled on current regimen. Denies hypo- or hyperthyroid symptoms.   4. Primary insomnia Was prescribed Belsomra and states it did not work so she quit taking it.     Relevant past medical, surgical, family, and social history reviewed and updated as indicated.  Allergies and medications reviewed and updated. Data reviewed: Chart in Epic.   Past Medical History:  Diagnosis Date   Allergy    Asthma    Cancer ([place])    cervical   Complication of anesthesia    " I woke and was short of breath."   CTS (carpal tunnel syndrome)    Diabetes mellitus without complication ([place])    Early cataracts, bilateral    Fracture    trimalleolar ankle left   GERD (gastroesophageal reflux disease)    Glaucoma    Glaucoma    History of kidney stones    " I passed it"   Hyperlipidemia    Hypertension    Hypothyroidism    Neuropathy    Vitamin D deficiency    Wears glasses     Past Surgical  History:  Procedure Laterality Date   ABDOMINAL HYSTERECTOMY     APPENDECTOMY     CARDIAC CATHETERIZATION  2006   negative   COLONOSCOPY  2009   negative   INNER EAR SURGERY Right 1981   left shoulder surgery Left 10/2009   ORIF ANKLE FRACTURE Left 09/05/2018   Procedure: OPEN REDUCTION INTERNAL FIXATION TRIMALLEOLAR FRACTURE POSSIBLE SYNDEMOSIS;  Surgeon: Erle Crocker, MD;  Location: [facility];  Service: Orthopedics;  Laterality: Left;   TONSILLECTOMY     TUBAL LIGATION      Social History   Socioeconomic History   Marital status: Married    Spouse name: Elta Guadeloupe   Number of children: 2   Years of education: Not on file   Highest education level: Not on file  Occupational History   Not on file  Tobacco Use   Smoking status: Former    Packs/day: 1.00    Years: 15.00    Total pack years: 15.00    Types: Cigarettes    Quit date: 06/05/2001    Years since quitting: 20.9   Smokeless tobacco: Never  Vaping Use   Vaping Use: Never used  Substance and Sexual Activity   Alcohol use: No   Drug use: No   Sexual activity: Not Currently  Other Topics Concern   Not on file  Social History Narrative   2 sons.  3 grandchildren.   Social Determinants of Health   Financial Resource Strain: Low Risk  (02/20/2022)   Overall Financial Resource Strain (CARDIA)    Difficulty of Paying Living Expenses: Not hard at all  Food Insecurity: No Food Insecurity (02/20/2022)   Hunger Vital Sign    Worried About Running Out of Food in the Last Year: Never true    Ran Out of Food in the Last Year: Never true  Transportation Needs: No Transportation Needs (02/20/2022)   PRAPARE - Hydrologist (Medical): No    Lack of Transportation (Non-Medical): No  Physical Activity: Insufficiently Active (02/20/2022)   Exercise Vital Sign    Days of Exercise per Week: 3 days    Minutes of Exercise per Session: 30 min  Stress: No Stress Concern Present (02/20/2022)   [place]    Feeling of Stress : Not at all  Social Connections: Moderately Integrated (02/20/2022)   Social Connection and Isolation Panel [NHANES]    Frequency of Communication with Friends and Family: More than three times a week    Frequency of Social Gatherings with Friends and Family: More than three times a week    Attends Religious Services: 1 to 4 times per year    Active Member of [employer] or Organizations: No    Attends Archivist Meetings: Never    Marital Status: Married  Human resources officer Violence: Not At Risk (02/20/2022)   Humiliation, Afraid, Rape, and Kick questionnaire    Fear of Current or Ex-Partner: No    Emotionally Abused: No    Physically Abused: No    Sexually Abused: No    Outpatient Encounter Medications as of 05/04/2022  Medication Sig   acetaminophen (TYLENOL) 500 MG tablet Take 1,000 mg by mouth every 6 (six) hours as needed for moderate pain.   albuterol (VENTOLIN HFA) 108 (90 Base) MCG/ACT inhaler Inhale 2 puffs into the lungs every 6 (six) hours as needed for wheezi ng orshortness of breath.   bimatoprost (LUMIGAN) 0.01 % SOLN Place 1 drop into both eyes at bedtime.   cetirizine (ZYRTEC) 10 MG tablet 1 tablet Orally Once a day for 30 day(s)   Cholecalciferol (VITAMIN D3) 50 MCG (2000 UT) capsule 1 tablet   Continuous Blood Gluc Receiver (FREESTYLE LIBRE 2 READER) DEVI Use to test blood sugar continuously. DX: E11.65   Continuous Blood Gluc Sensor (FREESTYLE LIBRE 2 SENSOR) MISC USE TO TEST BLOOD SUGAR CONTINUOUSLY   dapagliflozin propanediol (FARXIGA) 10 MG TABS tablet TAKE ONE TABLET BY MOUTH DAILY BEFORE BREAKFAST   glipiZIDE (GLUCOTROL XL) 10 MG 24 hr tablet Take 1 tablet (10 mg total) by mouth daily with breakfast.   levothyroxine (SYNTHROID) 75 MCG tablet TAKE ONE TABLET BY MOUTH DAILY   lisinopril (ZESTRIL) 5 MG tablet TAKE ONE TABLET DAILY FOR HIGH BLOOD PRESSURE   LUMIGAN 0.01 % SOLN  Place 1 drop into both eyes at bedtime.    metFORMIN (GLUCOPHAGE) 1000 MG tablet Take 1 tablet (1,000 mg total) by mouth 2 (two) times daily with a meal.   montelukast (SINGULAIR) 10 MG tablet Take 1 tablet (10 mg total) by mouth daily.   ondansetron (ZOFRAN) 4 MG tablet Take 1 tablet (4 mg total) by mouth every 8 (eight) hours as needed for nausea or vomiting.   pantoprazole (PROTONIX) 40 MG tablet Take 1 tablet (40 mg total) by mouth daily.   phenylephrine (SUDAFED PE) 10 MG TABS  tablet Take 10 mg by mouth every 4 (four) hours as needed (sinus congestion).   PROLIA 60 MG/ML SOSY injection Inject 60 mg into the skin every 6 (six) months.   rosuvastatin (CRESTOR) 20 MG tablet Take 1 tablet (20 mg total) by mouth daily.   TRELEGY ELLIPTA 200-62.5-25 MCG/ACT AEPB INHALE 1 PUFF ONCE DAILY   triamcinolone cream (KENALOG) 0.1 % Apply 1 application topically 2 (two) times daily as needed (dry skin).   vitamin B-12 (CYANOCOBALAMIN) 1000 MCG tablet Take 1,000 mcg by mouth daily.   [DISCONTINUED] Suvorexant (BELSOMRA) 5 MG TABS Take 5 mg by mouth at bedtime as needed.   No facility-administered encounter medications on file as of 05/04/2022.    Allergies  Allergen Reactions   Ozempic (0.25 Or 0.5 Mg-Dose) [Semaglutide(0.25 Or 0.50m-Dos)] Nausea And Vomiting    N/v, no energy    Review of Systems  Constitutional:  Negative for activity change, appetite change, chills, diaphoresis, fatigue, fever and unexpected weight change.  HENT: Negative.    Eyes: Negative.  Negative for photophobia and visual disturbance.  Respiratory:  Negative for cough, chest tightness and shortness of breath.   Cardiovascular:  Negative for chest pain, palpitations and leg swelling.  Gastrointestinal:  Positive for nausea. Negative for abdominal distention, abdominal pain, anal bleeding, blood in stool, constipation, diarrhea, rectal pain and vomiting.  Endocrine: Negative.  Negative for polydipsia, polyphagia and polyuria.   Genitourinary:  Negative for decreased urine volume, difficulty urinating, dysuria, frequency and urgency.  Musculoskeletal:  Negative for arthralgias and myalgias.  Skin: Negative.   Allergic/Immunologic: Negative.   Neurological:  Negative for dizziness, tremors, seizures, syncope, facial asymmetry, speech difficulty, weakness, light-headedness, numbness and headaches.  Hematological: Negative.   Psychiatric/Behavioral:  Positive for sleep disturbance. Negative for agitation, behavioral problems, confusion, decreased concentration, dysphoric mood, hallucinations, self-injury and suicidal ideas. The patient is not nervous/anxious and is not hyperactive.   All other systems reviewed and are negative.       Objective:  BP 119/65   Pulse 87   Temp (!) 97.4 F (36.3 C) (Temporal)   Ht 5' 2"$  (1.575 m)   Wt 113 lb 12.8 oz (51.6 kg)   SpO2 92%   BMI 20.81 kg/m    Wt Readings from Last 3 Encounters:  05/04/22 113 lb 12.8 oz (51.6 kg)  03/17/22 116 lb 3.2 oz (52.7 kg)  02/20/22 121 lb (54.9 kg)    Physical Exam Vitals and nursing note reviewed.  Constitutional:      General: She is not in acute distress.    Appearance: Normal appearance. She is well-developed and well-groomed. She is not ill-appearing, toxic-appearing or diaphoretic.  HENT:     Head: Normocephalic and atraumatic.     Jaw: There is normal jaw occlusion.     Right Ear: Hearing normal.     Left Ear: Hearing normal.     Nose: Nose normal.     Mouth/Throat:     Lips: Pink.     Mouth: Mucous membranes are moist.     Pharynx: Oropharynx is clear. Uvula midline.  Eyes:     General: Lids are normal.     Extraocular Movements: Extraocular movements intact.     Conjunctiva/sclera: Conjunctivae normal.     Pupils: Pupils are equal, round, and reactive to light.  Neck:     Thyroid: No thyroid mass, thyromegaly or thyroid tenderness.     Vascular: No carotid bruit or JVD.     Trachea: Trachea and phonation normal.  Cardiovascular:     Rate and Rhythm: Normal rate and regular rhythm.     Chest Wall: PMI is not displaced.     Pulses: Normal pulses.     Heart sounds: Normal heart sounds. No murmur heard.    No friction rub. No gallop.  Pulmonary:     Effort: Pulmonary effort is normal. No respiratory distress.     Breath sounds: Normal breath sounds. No wheezing.  Abdominal:     General: Bowel sounds are normal. There is no abdominal bruit.     Palpations: Abdomen is soft. There is no hepatomegaly or splenomegaly.     Tenderness: There is no abdominal tenderness.     Hernia: No hernia is present.  Musculoskeletal:     Cervical back: Normal range of motion and neck supple.     Right lower leg: No edema.     Left lower leg: No edema.  Lymphadenopathy:     Cervical: No cervical adenopathy.  Skin:    General: Skin is warm and dry.     Capillary Refill: Capillary refill takes less than 2 seconds.     Coloration: Skin is not cyanotic, jaundiced or pale.     Findings: No rash.  Neurological:     General: No focal deficit present.     Mental Status: She is alert and oriented to person, place, and time.     Sensory: Sensation is intact.     Motor: Motor function is intact.     Coordination: Coordination is intact.     Gait: Gait is intact.     Deep Tendon Reflexes: Reflexes are normal and symmetric.  Psychiatric:        Attention and Perception: Attention and perception normal.        Mood and Affect: Mood and affect normal.        Speech: Speech normal.        Behavior: Behavior normal. Behavior is cooperative.        Thought Content: Thought content normal.        Cognition and Memory: Cognition and memory normal.        Judgment: Judgment normal.     Results for orders placed or performed in visit on 02/01/22  Bayer DCA Hb A1c Waived  Result Value Ref Range   HB A1C (BAYER DCA - WAIVED) 7.1 (H) 4.8 - 5.6 %  Thyroid Panel With TSH  Result Value Ref Range   TSH 3.650 0.450 - 4.500  uIU/mL   T4, Total 7.5 4.5 - 12.0 ug/dL   T3 Uptake Ratio 30 24 - 39 %   Free Thyroxine Index 2.3 1.2 - 4.9  Vitamin B12  Result Value Ref Range   Vitamin B-12 730 232 - 1,245 pg/mL  CMP14+EGFR  Result Value Ref Range   Glucose 154 (H) 70 - 99 mg/dL   BUN 11 8 - 27 mg/dL   Creatinine, Ser 0.70 0.57 - 1.00 mg/dL   eGFR 96 >59 mL/min/1.73   BUN/Creatinine Ratio 16 12 - 28   Sodium 142 134 - 144 mmol/L   Potassium 3.7 3.5 - 5.2 mmol/L   Chloride 106 96 - 106 mmol/L   CO2 22 20 - 29 mmol/L   Calcium 9.2 8.7 - 10.3 mg/dL   Total Protein 6.2 6.0 - 8.5 g/dL   Albumin 4.3 3.9 - 4.9 g/dL   Globulin, Total 1.9 1.5 - 4.5 g/dL   Albumin/Globulin Ratio 2.3 (H) 1.2 - 2.2   Bilirubin Total  0.9 0.0 - 1.2 mg/dL   Alkaline Phosphatase 76 44 - 121 IU/L   AST 16 0 - 40 IU/L   ALT 14 0 - 32 IU/L  Microalbumin / creatinine urine ratio  Result Value Ref Range   Creatinine, Urine 69.5 Not Estab. mg/dL   Microalbumin, Urine 19.3 Not Estab. ug/mL   Microalb/Creat Ratio 28 0 - 29 mg/g creat     EKG: SR, 88, PR 108 ms, QT 376, no acute ST-T changes, no ectopy, no significant changes from prior EKG. [name], FNP-C  Pertinent labs & imaging results that were available during my care of the patient were reviewed by me and considered in my medical decision making.  Assessment & Plan:  Maria [name] was seen today for diabetes.  Diagnoses and all orders for this visit:  Type 2 diabetes mellitus with other specified complication, with long-term current use of insulin ([place]) Not controlled, A1C 9.1 in office today. Will work on lifestyle changes as her diabetes has been well controlled on current regimen for months. Will repeat A1C in 3 months and add Rybelsus if still elevated.  -     Bayer DCA Hb A1c Waived  Hypertension associated with type 2 diabetes mellitus (HCC) BP well controlled. Changes were not made in regimen today. Goal BP is 130/80. Pt aware to report any persistent high or low readings. DASH  diet and exercise encouraged. Exercise at least 150 minutes per week and increase as tolerated. Goal BMI > 25. Stress management encouraged. Avoid nicotine and tobacco product use. Avoid excessive alcohol and NSAID's. Avoid more than 2000 mg of sodium daily. Medications as prescribed. Follow up as scheduled.  -     CMP14+EGFR  Acquired hypothyroidism Thyroid disease has been well controlled. Labs are pending. Adjustments to regimen will be made if warranted. Make sure to take medications on an empty stomach with a full glass of water. Make sure to avoid vitamins or supplements for at least 4 hours before and 4 hours after taking medications. Repeat labs in 3 months if adjustments are made and in 6 months if stable.    Primary insomnia Belsomra was not beneficial so pt quit taking.   High risk medication use EKG unremarkable in office.  -     EKG 12-Lead  Nausea in adult Will trial below to see if beneficial.  -     ondansetron (ZOFRAN) 4 MG tablet; Take 1 tablet (4 mg total) by mouth every 8 (eight) hours as needed for nausea or vomiting.     Continue all other maintenance medications.  Follow up plan: Return in about 3 months (around 08/02/2022), or if symptoms worsen or fail to improve, for DM.   Continue healthy lifestyle choices, including diet (rich in fruits, vegetables, and lean proteins, and low in salt and simple carbohydrates) and exercise (at least 30 minutes of moderate physical activity daily).  Educational handout given for DM  The above assessment and management plan was discussed with the patient. The patient verbalized understanding of and has agreed to the management plan. Patient is aware to call the clinic if they develop any new symptoms or if symptoms persist or worsen. Patient is aware when to return to the clinic for a follow-up visit. Patient educated on when it is appropriate to go to the emergency department.   [name], FNP-C [facility] [phone]

## 2022-05-04 NOTE — Patient Instructions (Addendum)

## 2022-05-05 LAB — CMP14+EGFR
ALT: 20 IU/L (ref 0–32)
AST: 13 IU/L (ref 0–40)
Albumin/Globulin Ratio: 2.4 — ABNORMAL HIGH (ref 1.2–2.2)
Albumin: 4.3 g/dL (ref 3.9–4.9)
Alkaline Phosphatase: 80 IU/L (ref 44–121)
BUN/Creatinine Ratio: 25 (ref 12–28)
BUN: 12 mg/dL (ref 8–27)
Bilirubin Total: 0.8 mg/dL (ref 0.0–1.2)
CO2: 18 mmol/L — ABNORMAL LOW (ref 20–29)
Calcium: 8.4 mg/dL — ABNORMAL LOW (ref 8.7–10.3)
Chloride: 107 mmol/L — ABNORMAL HIGH (ref 96–106)
Creatinine, Ser: 0.48 mg/dL — ABNORMAL LOW (ref 0.57–1.00)
Globulin, Total: 1.8 g/dL (ref 1.5–4.5)
Glucose: 173 mg/dL — ABNORMAL HIGH (ref 70–99)
Potassium: 4.3 mmol/L (ref 3.5–5.2)
Sodium: 139 mmol/L (ref 134–144)
Total Protein: 6.1 g/dL (ref 6.0–8.5)
eGFR: 104 mL/min/{1.73_m2} (ref >59)

## 2022-07-05 IMAGING — MG MM DIGITAL SCREENING BILAT W/ TOMO AND CAD
8 series · 9 of 24 positions shown · non-contrast
Comparison: Previous exam(s).

ACR Breast Density Category a: The breast tissue is almost entirely
fatty.

CLINICAL DATA: Screening.

EXAM:
DIGITAL SCREENING BILATERAL MAMMOGRAM WITH TOMOSYNTHESIS AND CAD
TECHNIQUE: Bilateral screening digital craniocaudal and mediolateral oblique
mammograms were obtained. Bilateral screening digital breast
tomosynthesis was performed. The images were evaluated with
computer-aided detection.

[R CC synth-2D]
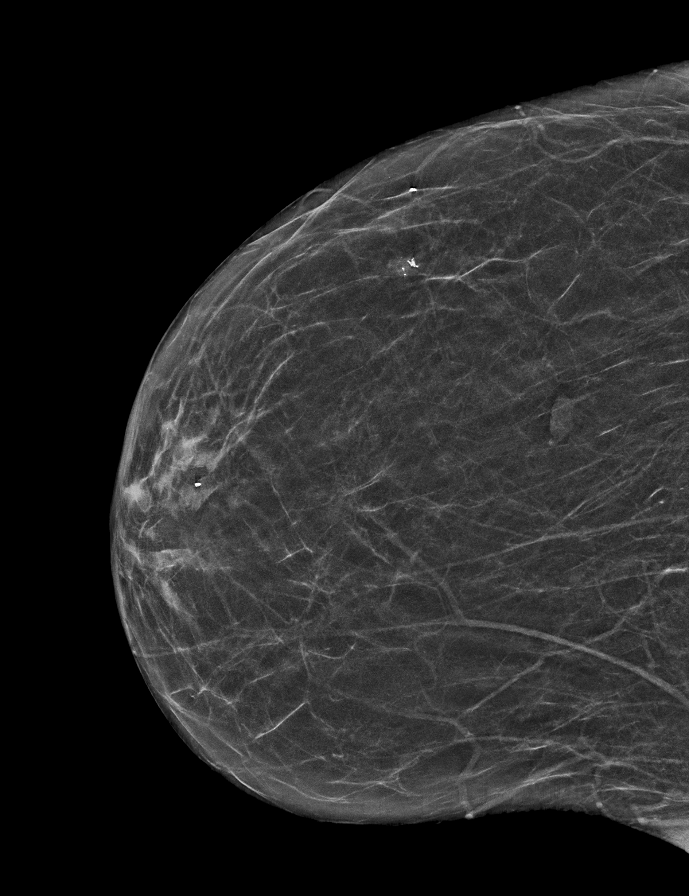

[L MLO synth-2D]
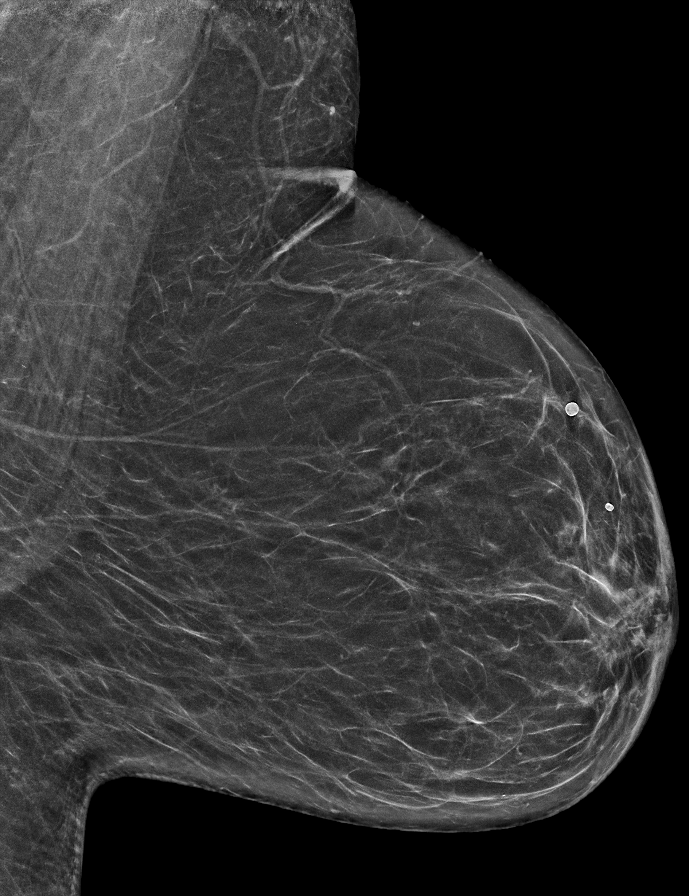

[L CC synth-2D]
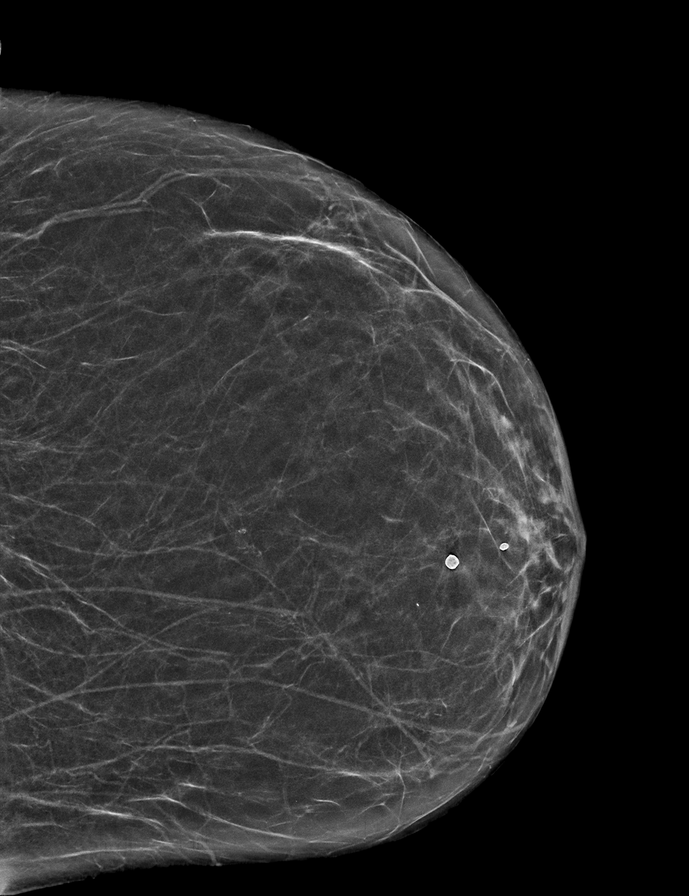

[R MLO synth-2D]
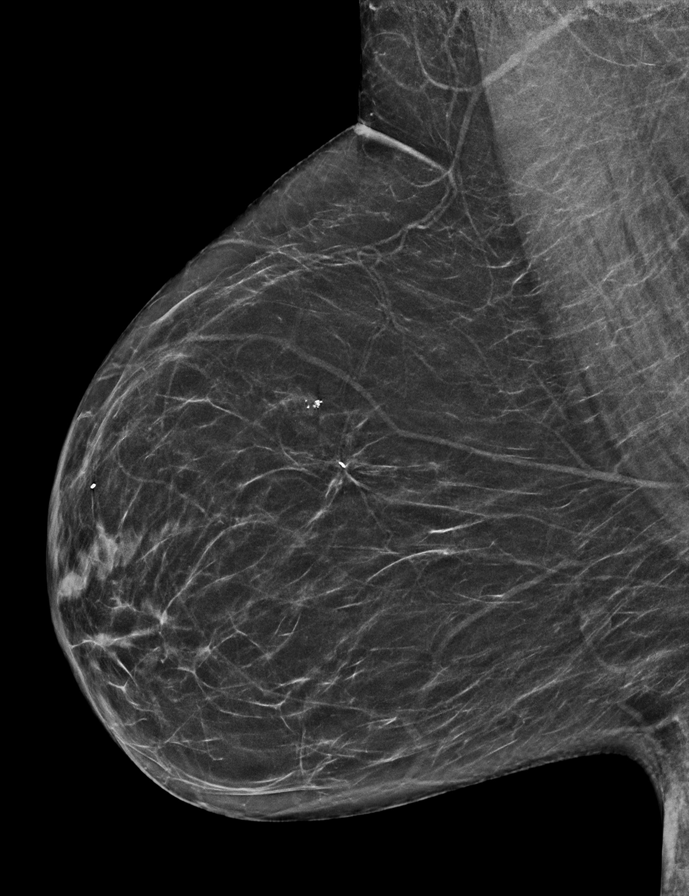

[L MLO tomo · 2 of 58 frames shown]
[frame 19/58]
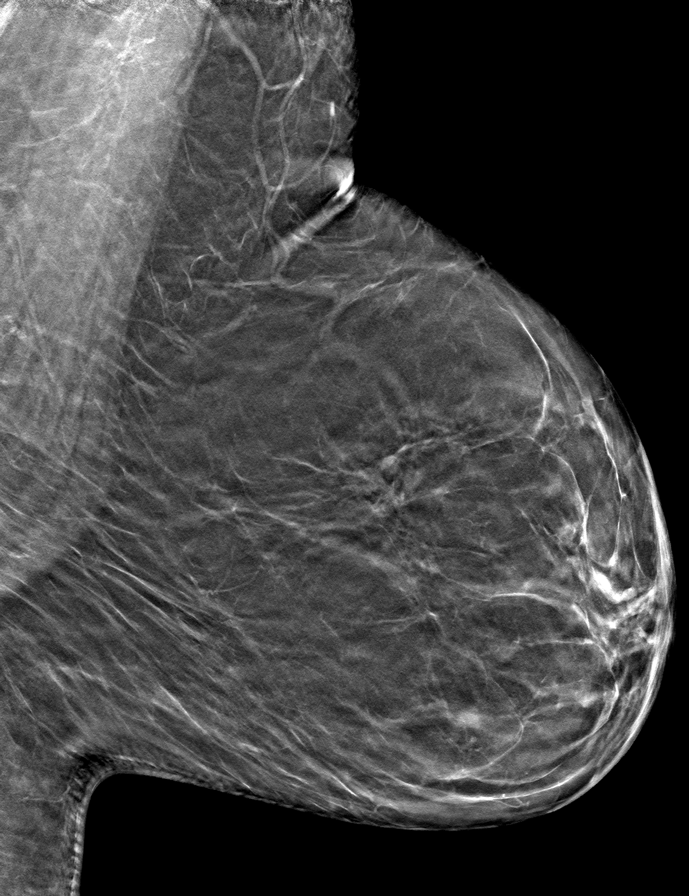
[frame 29/58]
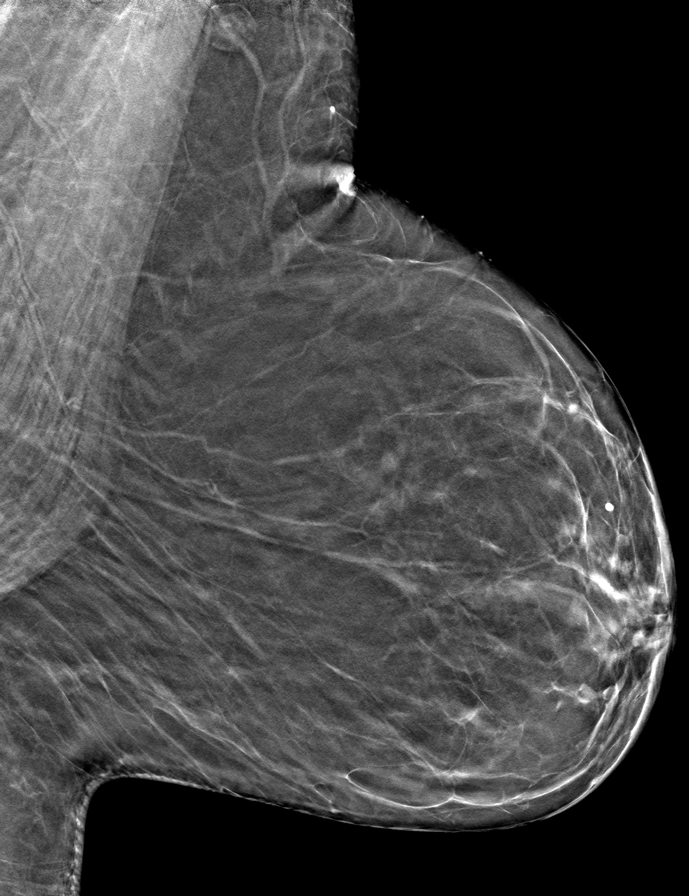

[L CC tomo · tomo slice 25/49.0]
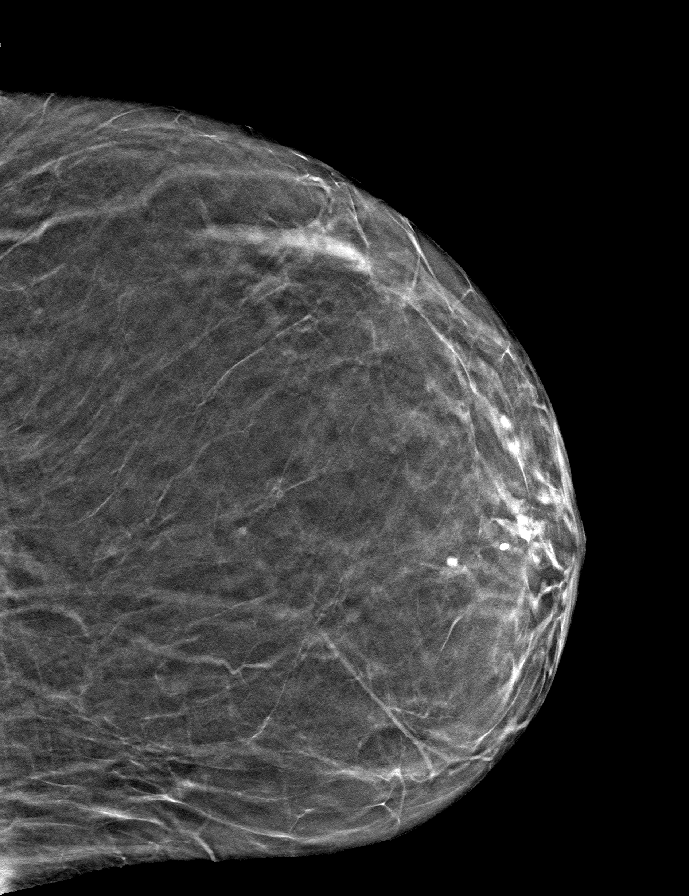

[R MLO tomo · tomo slice 27/54.0]
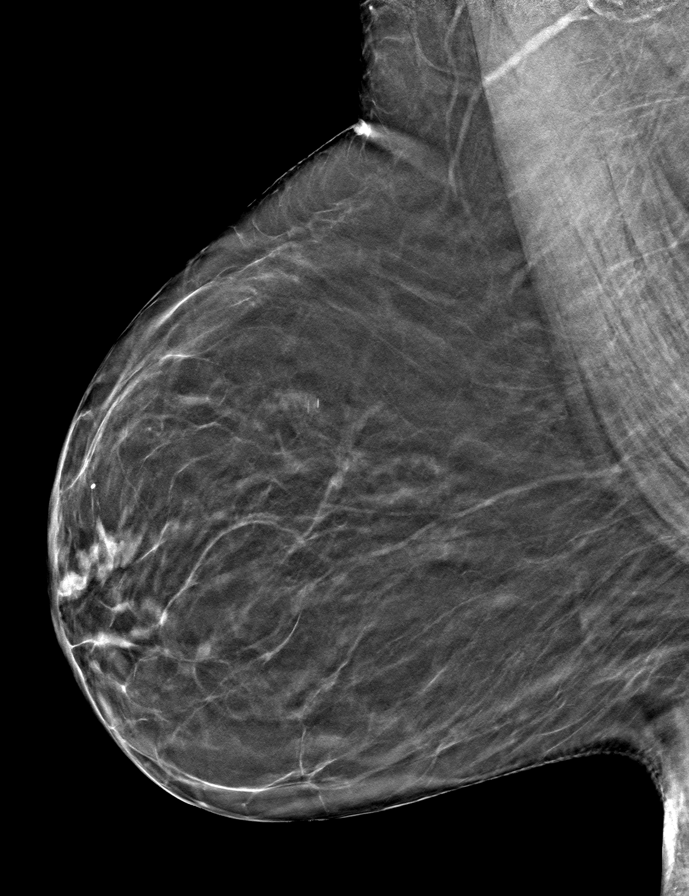

[R CC tomo · tomo slice 24/47.0]
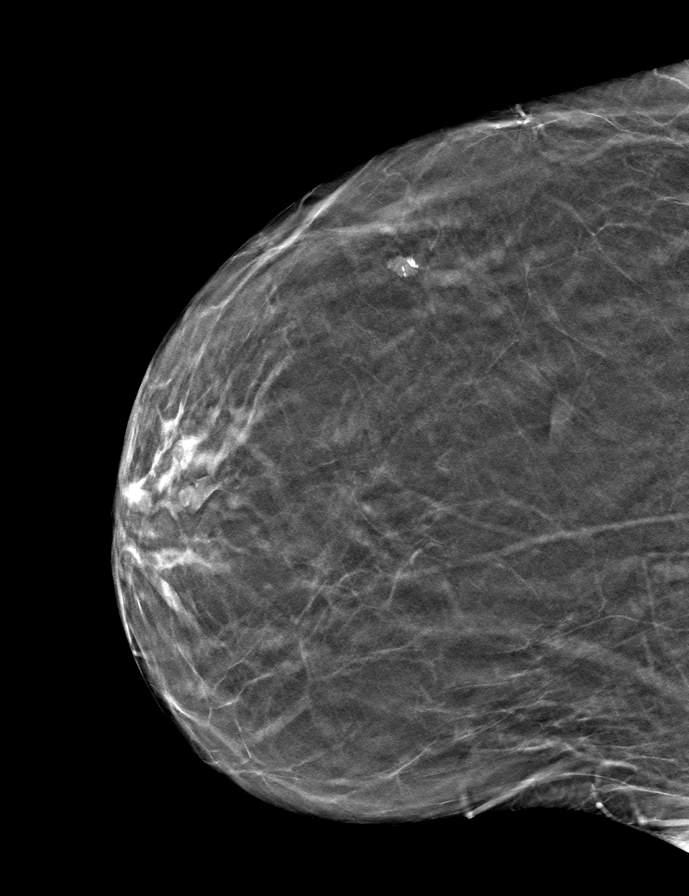

[9 of 24 positions shown; findings below may reference images not displayed]

FINDINGS: There are no findings suspicious for malignancy.
IMPRESSION: No mammographic evidence of malignancy. A result letter of this
screening mammogram will be mailed directly to the patient.

RECOMMENDATION:
Screening mammogram in one year. (Code:0E-3-N98)

BI-RADS CATEGORY  1: Negative.

## 2022-07-07 DIAGNOSIS — H401132 Primary open-angle glaucoma, bilateral, moderate stage: Secondary | ICD-10-CM | POA: Diagnosis not present

## 2022-07-10 ENCOUNTER — Other Ambulatory Visit: Payer: Self-pay | Admitting: Family Medicine

## 2022-07-10 DIAGNOSIS — J4540 Moderate persistent asthma, uncomplicated: Secondary | ICD-10-CM

## 2022-07-10 DIAGNOSIS — K219 Gastro-esophageal reflux disease without esophagitis: Secondary | ICD-10-CM

## 2022-07-10 DIAGNOSIS — E1169 Type 2 diabetes mellitus with other specified complication: Secondary | ICD-10-CM

## 2022-07-18 ENCOUNTER — Other Ambulatory Visit: Payer: Self-pay | Admitting: Family Medicine

## 2022-07-18 DIAGNOSIS — Z1231 Encounter for screening mammogram for malignant neoplasm of breast: Secondary | ICD-10-CM

## 2022-07-28 ENCOUNTER — Telehealth: Payer: Self-pay | Admitting: Family Medicine

## 2022-07-28 ENCOUNTER — Other Ambulatory Visit: Payer: Self-pay | Admitting: Family Medicine

## 2022-07-28 DIAGNOSIS — E785 Hyperlipidemia, unspecified: Secondary | ICD-10-CM

## 2022-07-28 NOTE — Telephone Encounter (Signed)
Can we please check benefits for Prolia.  Injection Due Date:  09/09/2022

## 2022-07-31 ENCOUNTER — Ambulatory Visit
Admission: RE | Admit: 2022-07-31 | Discharge: 2022-07-31 | Disposition: A | Discharge status: Home / Self Care | Payer: Medicare Other | Source: Ambulatory Visit | Attending: Family Medicine | Admitting: Family Medicine

## 2022-07-31 DIAGNOSIS — Z1231 Encounter for screening mammogram for malignant neoplasm of breast: Secondary | ICD-10-CM | POA: Diagnosis not present

## 2022-08-01 ENCOUNTER — Telehealth: Payer: Self-pay

## 2022-08-01 NOTE — Telephone Encounter (Signed)
Prolia VOB initiated via [email or web address] 

## 2022-08-01 NOTE — Telephone Encounter (Signed)
Created new encounter for Prolia BIV. Will route encounter back once benefit verification is complete.

## 2022-08-03 ENCOUNTER — Other Ambulatory Visit (HOSPITAL_COMMUNITY): Payer: Self-pay

## 2022-08-03 NOTE — Telephone Encounter (Addendum)
Pt ready for scheduling for PROLIA on or after : 09/09/22  Out-of-pocket cost due at time of visit: $332  Primary: [employer] MEDICARE Prolia co-insurance: 20% Admin fee co-insurance: $30  Secondary: --- Prolia co-insurance:  Admin fee co-insurance:   Medical Benefit Details: Date Benefits were checked: 08/01/22 Deductible: NO/ Coinsurance: 20%/ Admin Fee: $30  Prior Auth: APPROVED PA# [id number]  Expiration Date: 08/03/2023   Pharmacy benefit: Copay $11.20 If patient wants fill through the pharmacy benefit please send prescription to: [facility], and include estimated need by date in rx notes. Pharmacy will ship medication directly to the office.  Patient NOT eligible for Prolia Copay Card. Copay Card can make patient's cost as little as $25. Link to apply: [email or web address]  ** This summary of benefits is an estimation of the patient's out-of-pocket cost. Exact cost may very based on individual plan coverage.

## 2022-08-03 NOTE — Telephone Encounter (Signed)
Authorization Number: [id number] 08/03/22-08/03/23

## 2022-08-09 ENCOUNTER — Ambulatory Visit (INDEPENDENT_AMBULATORY_CARE_PROVIDER_SITE_OTHER): Payer: Medicare Other | Admitting: Family Medicine

## 2022-08-09 ENCOUNTER — Encounter: Payer: Self-pay | Admitting: Family Medicine

## 2022-08-09 ENCOUNTER — Telehealth: Payer: Self-pay | Admitting: Family Medicine

## 2022-08-09 VITALS — BP 108/69 | HR 93 | Temp 98.0°F | Ht 62.0 in | Wt 110.6 lb

## 2022-08-09 DIAGNOSIS — I152 Hypertension secondary to endocrine disorders: Secondary | ICD-10-CM | POA: Diagnosis not present

## 2022-08-09 DIAGNOSIS — Z23 Encounter for immunization: Secondary | ICD-10-CM

## 2022-08-09 DIAGNOSIS — Z794 Long term (current) use of insulin: Secondary | ICD-10-CM | POA: Diagnosis not present

## 2022-08-09 DIAGNOSIS — E1169 Type 2 diabetes mellitus with other specified complication: Secondary | ICD-10-CM | POA: Diagnosis not present

## 2022-08-09 DIAGNOSIS — E1159 Type 2 diabetes mellitus with other circulatory complications: Secondary | ICD-10-CM | POA: Diagnosis not present

## 2022-08-09 LAB — CMP14+EGFR
ALT: 24 IU/L (ref 0–32)
AST: 17 IU/L (ref 0–40)
Albumin/Globulin Ratio: 1.9 (ref 1.2–2.2)
Albumin: 4.3 g/dL (ref 3.9–4.9)
Alkaline Phosphatase: 98 IU/L (ref 44–121)
BUN/Creatinine Ratio: 17 (ref 12–28)
BUN: 12 mg/dL (ref 8–27)
Bilirubin Total: 0.9 mg/dL (ref 0.0–1.2)
CO2: 20 mmol/L (ref 20–29)
Calcium: 9.2 mg/dL (ref 8.7–10.3)
Chloride: 101 mmol/L (ref 96–106)
Creatinine, Ser: 0.69 mg/dL (ref 0.57–1.00)
Globulin, Total: 2.3 g/dL (ref 1.5–4.5)
Glucose: 245 mg/dL — ABNORMAL HIGH (ref 70–99)
Potassium: 4 mmol/L (ref 3.5–5.2)
Sodium: 138 mmol/L (ref 134–144)
Total Protein: 6.6 g/dL (ref 6.0–8.5)
eGFR: 96 mL/min/{1.73_m2} (ref >59)

## 2022-08-09 LAB — BAYER DCA HB A1C WAIVED: HB A1C (BAYER DCA - WAIVED): 8.8 % — ABNORMAL HIGH (ref 4.8–5.6)

## 2022-08-09 MED ORDER — GLIPIZIDE ER 10 MG PO TB24
20.0000 mg | ORAL_TABLET | Freq: Every day | ORAL | 1 refills | Status: DC
Start: 2022-08-09 — End: 2022-11-09

## 2022-08-09 NOTE — Addendum Note (Signed)
Addended by: Sonny Masters on: 08/09/2022 09:21 AM   Modules accepted: Level of Service

## 2022-08-09 NOTE — Patient Instructions (Addendum)
Slow Mag  Continue to monitor your blood sugars as we discussed and record them. Bring the log to your next appointment.  Take your medications as directed.    Goal Blood glucose:    Fasting (before meals) = 80 to 130   Within 2 hours of eating = less than 180   Understanding your Hemoglobin A1c:8.8     Diabetes Mellitus and Nutrition    I think that you would greatly benefit from seeing a nutritionist. If this is something you are interested in, please call Dr Gerilyn Pilgrim at [phone] to schedule an appointment.   When you have diabetes (diabetes mellitus), it is very important to have healthy eating habits because your blood sugar (glucose) levels are greatly affected by what you eat and drink. Eating healthy foods in the appropriate amounts, at about the same times every day, can help you: Control your blood glucose. Lower your risk of heart disease. Improve your blood pressure. Reach or maintain a healthy weight.  Every person with diabetes is different, and each person has different needs for a meal plan. Your health care provider may recommend that you work with a diet and nutrition specialist (dietitian) to make a meal plan that is best for you. Your meal plan may vary depending on factors such as: The calories you need. The medicines you take. Your weight. Your blood glucose, blood pressure, and cholesterol levels. Your activity level. Other health conditions you have, such as heart or kidney disease.  How do carbohydrates affect me? Carbohydrates affect your blood glucose level more than any other type of food. Eating carbohydrates naturally increases the amount of glucose in your blood. Carbohydrate counting is a method for keeping track of how many carbohydrates you eat. Counting carbohydrates is important to keep your blood glucose at a healthy level, especially if you use insulin or take certain oral diabetes medicines. It is important to know how many carbohydrates you  can safely have in each meal. This is different for every person. Your dietitian can help you calculate how many carbohydrates you should have at each meal and for snack. Foods that contain carbohydrates include: Bread, cereal, rice, pasta, and crackers. Potatoes and corn. Peas, beans, and lentils. Milk and yogurt. Fruit and juice. Desserts, such as cakes, cookies, ice cream, and candy.  How does alcohol affect me? Alcohol can cause a sudden decrease in blood glucose (hypoglycemia), especially if you use insulin or take certain oral diabetes medicines. Hypoglycemia can be a life-threatening condition. Symptoms of hypoglycemia (sleepiness, dizziness, and confusion) are similar to symptoms of having too much alcohol. If your health care provider says that alcohol is safe for you, follow these guidelines: Limit alcohol intake to no more than 1 drink per day for nonpregnant women and 2 drinks per day for men. One drink equals 12 oz of beer, 5 oz of wine, or 1 oz of hard liquor. Do not drink on an empty stomach. Keep yourself hydrated with water, diet soda, or unsweetened iced tea. Keep in mind that regular soda, juice, and other mixers may contain a lot of sugar and must be counted as carbohydrates.  What are tips for following this plan?  Reading food labels Start by checking the serving size on the label. The amount of calories, carbohydrates, fats, and other nutrients listed on the label are based on one serving of the food. Many foods contain more than one serving per package. Check the total grams (g) of carbohydrates in one serving. You  can calculate the number of servings of carbohydrates in one serving by dividing the total carbohydrates by 15. For example, if a food has 30 g of total carbohydrates, it would be equal to 2 servings of carbohydrates. Check the number of grams (g) of saturated and trans fats in one serving. Choose foods that have low or no amount of these fats. Check the  number of milligrams (mg) of sodium in one serving. Most people should limit total sodium intake to less than 2,300 mg per day. Always check the nutrition information of foods labeled as "low-fat" or "nonfat". These foods may be higher in added sugar or refined carbohydrates and should be avoided. Talk to your dietitian to identify your daily goals for nutrients listed on the label.  Shopping Avoid buying canned, premade, or processed foods. These foods tend to be high in fat, sodium, and added sugar. Shop around the outside edge of the grocery store. This includes fresh fruits and vegetables, bulk grains, fresh meats, and fresh dairy.  Cooking Use low-heat cooking methods, such as baking, instead of high-heat cooking methods like deep frying. Cook using healthy oils, such as olive, canola, or sunflower oil. Avoid cooking with butter, cream, or high-fat meats.  Meal planning Eat meals and snacks regularly, preferably at the same times every day. Avoid going long periods of time without eating. Eat foods high in fiber, such as fresh fruits, vegetables, beans, and whole grains. Talk to your dietitian about how many servings of carbohydrates you can eat at each meal. Eat 4-6 ounces of lean protein each day, such as lean meat, chicken, fish, eggs, or tofu. 1 ounce is equal to 1 ounce of meat, chicken, or fish, 1 egg, or 1/4 cup of tofu. Eat some foods each day that contain healthy fats, such as avocado, nuts, seeds, and fish.  Lifestyle  Check your blood glucose regularly. Exercise at least 30 minutes 5 or more days each week, or as told by your health care provider. Take medicines as told by your health care provider. Do not use any products that contain nicotine or tobacco, such as cigarettes and e-cigarettes. If you need help quitting, ask your health care provider. Work with a Veterinary surgeon or diabetes educator to identify strategies to manage stress and any emotional and social  challenges.  What are some questions to ask my health care provider? Do I need to meet with a diabetes educator? Do I need to meet with a dietitian? What number can I call if I have questions? When are the best times to check my blood glucose?  Where to find more information: American Diabetes Association: diabetes.org/food-and-fitness/food Academy of Nutrition and Dietetics: https://www.vargas.com/ [employer] of Diabetes and Digestive and Kidney Diseases (NIH): FindJewelers.cz  Summary A healthy meal plan will help you control your blood glucose and maintain a healthy lifestyle. Working with a diet and nutrition specialist (dietitian) can help you make a meal plan that is best for you. Keep in mind that carbohydrates and alcohol have immediate effects on your blood glucose levels. It is important to count carbohydrates and to use alcohol carefully. This information is not intended to replace advice given to you by your health care provider. Make sure you discuss any questions you have with your health care provider. Document Released: 12/01/2004 Document Revised: 04/10/2016 Document Reviewed: 04/10/2016 Elsevier Interactive Patient Education  [employer].

## 2022-08-09 NOTE — Progress Notes (Addendum)
Subjective:  Patient ID: Maria [name], female    DOB: 05/03/1955, 67 y.o.   MRN: [id number]  Patient Care Team: Sonny Masters, FNP as PCP - General (Family Medicine) Danella Maiers, [facility] as Pharmacist (Family Medicine)   Chief Complaint:  Diabetes (3 month follow up )   HPI: Maria [name] is a 67 y.o. female presenting on 08/09/2022 for Diabetes (3 month follow up )   Diabetes She presents for her follow-up diabetic visit. She has type 2 diabetes mellitus. Pertinent negatives for hypoglycemia include no confusion, dizziness, headaches, hunger, mood changes, nervousness/anxiousness, pallor, seizures, sleepiness, speech difficulty, sweats or tremors. Associated symptoms include polyuria. Pertinent negatives for diabetes include no blurred vision, no chest pain, no fatigue, no polydipsia, no polyphagia and no weakness. Pertinent negatives for hypoglycemia complications include no blackouts, no hospitalization, no nocturnal hypoglycemia, no required assistance and no required glucagon injection. Diabetic complications include heart disease and peripheral neuropathy. Risk factors for coronary artery disease include diabetes mellitus, dyslipidemia, hypertension and post-menopausal. Current diabetic treatment includes oral agent (triple therapy). She is compliant with treatment all of the time. She is following a diabetic and generally healthy diet. Her home blood glucose trend is fluctuating minimally. Her breakfast blood glucose range is generally 180-200 mg/dl. An ACE inhibitor/angiotensin II receptor blocker is being taken. Eye exam is current.  Hypertension This is a chronic problem. The current episode started more than 1 year ago. The problem is controlled. Pertinent negatives include no anxiety, blurred vision, chest pain, headaches, malaise/fatigue, neck pain, orthopnea, palpitations, peripheral edema, PND, shortness of breath or sweats. Risk factors for coronary artery disease include  dyslipidemia, diabetes mellitus and post-menopausal state. Past treatments include ACE inhibitors. The current treatment provides significant improvement. There are no compliance problems.      Relevant past medical, surgical, family, and social history reviewed and updated as indicated.  Allergies and medications reviewed and updated. Data reviewed: Chart in Epic.   Past Medical History:  Diagnosis Date   Allergy    Asthma    Cancer (HCC)    cervical   Complication of anesthesia    " I woke and was short of breath."   CTS (carpal tunnel syndrome)    Diabetes mellitus without complication (HCC)    Early cataracts, bilateral    Fracture    trimalleolar ankle left   GERD (gastroesophageal reflux disease)    Glaucoma    Glaucoma    History of kidney stones    " I passed it"   Hyperlipidemia    Hypertension    Hypothyroidism    Neuropathy    Vitamin D deficiency    Wears glasses     Past Surgical History:  Procedure Laterality Date   ABDOMINAL HYSTERECTOMY     APPENDECTOMY     CARDIAC CATHETERIZATION  2006   negative   COLONOSCOPY  2009   negative   INNER EAR SURGERY Right 1981   left shoulder surgery Left 10/2009   ORIF ANKLE FRACTURE Left 09/05/2018   Procedure: OPEN REDUCTION INTERNAL FIXATION TRIMALLEOLAR FRACTURE POSSIBLE SYNDEMOSIS;  Surgeon: Terance Hart, MD;  Location: [facility];  Service: Orthopedics;  Laterality: Left;   TONSILLECTOMY     TUBAL LIGATION      Social History   Socioeconomic History   Marital status: Married    Spouse name: Loraine Leriche   Number of children: 2   Years of education: Not on file   Highest education level: Not  on file  Occupational History   Not on file  Tobacco Use   Smoking status: Former    Packs/day: 1.00    Years: 15.00    Additional pack years: 0.00    Total pack years: 15.00    Types: Cigarettes    Quit date: 06/05/2001    Years since quitting: 21.1   Smokeless tobacco: Never  Vaping Use   Vaping Use: Never used   Substance and Sexual Activity   Alcohol use: No   Drug use: No   Sexual activity: Not Currently  Other Topics Concern   Not on file  Social History Narrative   2 sons.   3 grandchildren.   Social Determinants of Health   Financial Resource Strain: Low Risk  (02/20/2022)   Overall Financial Resource Strain (CARDIA)    Difficulty of Paying Living Expenses: Not hard at all  Food Insecurity: No Food Insecurity (02/20/2022)   Hunger Vital Sign    Worried About Running Out of Food in the Last Year: Never true    Ran Out of Food in the Last Year: Never true  Transportation Needs: No Transportation Needs (02/20/2022)   PRAPARE - Administrator, Civil Service (Medical): No    Lack of Transportation (Non-Medical): No  Physical Activity: Insufficiently Active (02/20/2022)   Exercise Vital Sign    Days of Exercise per Week: 3 days    Minutes of Exercise per Session: 30 min  Stress: No Stress Concern Present (02/20/2022)   [employer] of Occupational Health - Occupational Stress Questionnaire    Feeling of Stress : Not at all  Social Connections: Moderately Integrated (02/20/2022)   Social Connection and Isolation Panel [NHANES]    Frequency of Communication with Friends and Family: More than three times a week    Frequency of Social Gatherings with Friends and Family: More than three times a week    Attends Religious Services: 1 to 4 times per year    Active Member of [employer] or Organizations: No    Attends Banker Meetings: Never    Marital Status: Married  Catering manager Violence: Not At Risk (02/20/2022)   Humiliation, Afraid, Rape, and Kick questionnaire    Fear of Current or Ex-Partner: No    Emotionally Abused: No    Physically Abused: No    Sexually Abused: No    Outpatient Encounter Medications as of 08/09/2022  Medication Sig   acetaminophen (TYLENOL) 500 MG tablet Take 1,000 mg by mouth every 6 (six) hours as needed for moderate pain.   albuterol  (VENTOLIN HFA) 108 (90 Base) MCG/ACT inhaler Inhale 2 puffs into the lungs every 6 (six) hours as needed for wheezi ng orshortness of breath.   bimatoprost (LUMIGAN) 0.01 % SOLN Place 1 drop into both eyes at bedtime.   calcium carbonate (OSCAL) 1500 (600 Ca) MG TABS tablet Take by mouth 2 (two) times daily with a meal.   cetirizine (ZYRTEC) 10 MG tablet 1 tablet Orally Once a day for 30 day(s)   Cholecalciferol (VITAMIN D3) 50 MCG (2000 UT) capsule 1 tablet   Continuous Blood Gluc Receiver (FREESTYLE LIBRE 2 READER) DEVI Use to test blood sugar continuously. DX: E11.65   Continuous Blood Gluc Sensor (FREESTYLE LIBRE 2 SENSOR) MISC USE TO TEST BLOOD SUGAR CONTINUOUSLY   FARXIGA 10 MG TABS tablet TAKE ONE TABLET DAILY BEFORE BREAKFAST   glipiZIDE (GLUCOTROL XL) 10 MG 24 hr tablet Take 2 tablets (20 mg total) by mouth daily with  breakfast.   levothyroxine (SYNTHROID) 75 MCG tablet TAKE ONE TABLET BY MOUTH DAILY   lisinopril (ZESTRIL) 5 MG tablet TAKE ONE TABLET DAILY FOR HIGH BLOOD PRESSURE   LUMIGAN 0.01 % SOLN Place 1 drop into both eyes at bedtime.    metFORMIN (GLUCOPHAGE) 1000 MG tablet TAKE ONE TABLET TWICE DAILY WITH MEAL(S)   montelukast (SINGULAIR) 10 MG tablet TAKE ONE TABLET ONCE DAILY   ondansetron (ZOFRAN) 4 MG tablet Take 1 tablet (4 mg total) by mouth every 8 (eight) hours as needed for nausea or vomiting.   pantoprazole (PROTONIX) 40 MG tablet TAKE ONE TABLET ONCE DAILY   phenylephrine (SUDAFED PE) 10 MG TABS tablet Take 10 mg by mouth every 4 (four) hours as needed (sinus congestion).   PROLIA 60 MG/ML SOSY injection Inject 60 mg into the skin every 6 (six) months.   rosuvastatin (CRESTOR) 20 MG tablet TAKE 1 TABLET DAILY   TRELEGY ELLIPTA 200-62.5-25 MCG/ACT AEPB INHALE 1 PUFF ONCE DAILY   triamcinolone cream (KENALOG) 0.1 % Apply 1 application topically 2 (two) times daily as needed (dry skin).   vitamin B-12 (CYANOCOBALAMIN) 1000 MCG tablet Take 1,000 mcg by mouth daily.    [DISCONTINUED] glipiZIDE (GLUCOTROL XL) 10 MG 24 hr tablet Take 1 tablet (10 mg total) by mouth daily with breakfast.   No facility-administered encounter medications on file as of 08/09/2022.    Allergies  Allergen Reactions   Ozempic (0.25 Or 0.5 Mg-Dose) [Semaglutide(0.25 Or 0.5mg -Dos)] Nausea And Vomiting    N/v, no energy    Review of Systems  Constitutional:  Negative for activity change, appetite change, chills, diaphoresis, fatigue, fever, malaise/fatigue and unexpected weight change.  HENT: Negative.    Eyes: Negative.  Negative for blurred vision, photophobia and visual disturbance.  Respiratory:  Negative for cough, chest tightness and shortness of breath.   Cardiovascular:  Negative for chest pain, palpitations, orthopnea, leg swelling and PND.  Gastrointestinal:  Negative for abdominal pain, blood in stool, constipation, diarrhea, nausea and vomiting.  Endocrine: Positive for polyuria. Negative for cold intolerance, heat intolerance, polydipsia and polyphagia.  Genitourinary:  Negative for decreased urine volume, difficulty urinating, dysuria, frequency and urgency.  Musculoskeletal:  Negative for arthralgias, myalgias and neck pain.  Skin: Negative.  Negative for pallor.  Allergic/Immunologic: Negative.   Neurological:  Negative for dizziness, tremors, seizures, syncope, facial asymmetry, speech difficulty, weakness, light-headedness, numbness and headaches.  Hematological: Negative.   Psychiatric/Behavioral:  Negative for confusion, hallucinations, sleep disturbance and suicidal ideas. The patient is not nervous/anxious.   All other systems reviewed and are negative.       Objective:  BP 108/69   Pulse 93   Temp 98 F (36.7 C) (Temporal)   Ht 5\' 2"  (1.575 m)   Wt 110 lb 9.6 oz (50.2 kg)   SpO2 98%   BMI 20.23 kg/m    Wt Readings from Last 3 Encounters:  08/09/22 110 lb 9.6 oz (50.2 kg)  05/04/22 113 lb 12.8 oz (51.6 kg)  03/17/22 116 lb 3.2 oz (52.7 kg)     Physical Exam Vitals and nursing note reviewed.  Constitutional:      General: She is not in acute distress.    Appearance: Normal appearance. She is well-developed, well-groomed and normal weight. She is not ill-appearing, toxic-appearing or diaphoretic.  HENT:     Head: Normocephalic and atraumatic.     Jaw: There is normal jaw occlusion.     Right Ear: Hearing normal.     Left Ear:  Hearing normal.     Nose: Nose normal.     Mouth/Throat:     Lips: Pink.     Mouth: Mucous membranes are moist.     Pharynx: Oropharynx is clear. Uvula midline.  Eyes:     General: Lids are normal.     Extraocular Movements: Extraocular movements intact.     Conjunctiva/sclera: Conjunctivae normal.     Pupils: Pupils are equal, round, and reactive to light.  Neck:     Thyroid: No thyroid mass, thyromegaly or thyroid tenderness.     Vascular: No carotid bruit or JVD.     Trachea: Trachea and phonation normal.  Cardiovascular:     Rate and Rhythm: Normal rate and regular rhythm.     Chest Wall: PMI is not displaced.     Pulses: Normal pulses.     Heart sounds: Normal heart sounds. No murmur heard.    No friction rub. No gallop.  Pulmonary:     Effort: Pulmonary effort is normal. No respiratory distress.     Breath sounds: Normal breath sounds. No wheezing.  Abdominal:     General: Bowel sounds are normal. There is no distension or abdominal bruit.     Palpations: Abdomen is soft. There is no hepatomegaly or splenomegaly.     Tenderness: There is no abdominal tenderness. There is no right CVA tenderness or left CVA tenderness.     Hernia: No hernia is present.  Musculoskeletal:        General: Normal range of motion.     Cervical back: Normal range of motion and neck supple.     Right lower leg: No edema.     Left lower leg: No edema.  Lymphadenopathy:     Cervical: No cervical adenopathy.  Skin:    General: Skin is warm and dry.     Capillary Refill: Capillary refill takes less than 2  seconds.     Coloration: Skin is not cyanotic, jaundiced or pale.     Findings: No rash.  Neurological:     General: No focal deficit present.     Mental Status: She is alert and oriented to person, place, and time.     Sensory: Sensation is intact.     Motor: Motor function is intact.     Coordination: Coordination is intact.     Gait: Gait is intact.     Deep Tendon Reflexes: Reflexes are normal and symmetric.  Psychiatric:        Attention and Perception: Attention and perception normal.        Mood and Affect: Mood and affect normal.        Speech: Speech normal.        Behavior: Behavior normal. Behavior is cooperative.        Thought Content: Thought content normal.        Cognition and Memory: Cognition and memory normal.        Judgment: Judgment normal.     Results for orders placed or performed in visit on 05/04/22  Bayer DCA Hb A1c Waived  Result Value Ref Range   HB A1C (BAYER DCA - WAIVED) 9.1 (H) 4.8 - 5.6 %  CMP14+EGFR  Result Value Ref Range   Glucose 173 (H) 70 - 99 mg/dL   BUN 12 8 - 27 mg/dL   Creatinine, Ser 1.61 (L) 0.57 - 1.00 mg/dL   eGFR 096 >04 VW/UJW/1.19   BUN/Creatinine Ratio 25 12 - 28   Sodium 139 134 -  144 mmol/L   Potassium 4.3 3.5 - 5.2 mmol/L   Chloride 107 (H) 96 - 106 mmol/L   CO2 18 (L) 20 - 29 mmol/L   Calcium 8.4 (L) 8.7 - 10.3 mg/dL   Total Protein 6.1 6.0 - 8.5 g/dL   Albumin 4.3 3.9 - 4.9 g/dL   Globulin, Total 1.8 1.5 - 4.5 g/dL   Albumin/Globulin Ratio 2.4 (H) 1.2 - 2.2   Bilirubin Total 0.8 0.0 - 1.2 mg/dL   Alkaline Phosphatase 80 44 - 121 IU/L   AST 13 0 - 40 IU/L   ALT 20 0 - 32 IU/L       Pertinent labs & imaging results that were available during my care of the patient were reviewed by me and considered in my medical decision making.  Assessment & Plan:  Maria [name] was seen today for diabetes.  Diagnoses and all orders for this visit:  Type 2 diabetes mellitus with other specified complication, with long-term current  use of insulin (HCC) A1C 8.8, has been unable to tolerate Ozempic. Will increase glipizide to 20 mg daily. Continue to monitor BS. Follow up in 3 months, if still elevated, consider Mounjaro.  -     Bayer DCA Hb A1c Waived -     glipiZIDE (GLUCOTROL XL) 10 MG 24 hr tablet; Take 2 tablets (20 mg total) by mouth daily with breakfast.  Hypertension associated with type 2 diabetes mellitus (HCC) BP well controlled. Changes were not made in regimen today. Goal BP is 130/80. Pt aware to report any persistent high or low readings. DASH diet and exercise encouraged. Exercise at least 150 minutes per week and increase as tolerated. Goal BMI > 25. Stress management encouraged. Avoid nicotine and tobacco product use. Avoid excessive alcohol and NSAID's. Avoid more than 2000 mg of sodium daily. Medications as prescribed. Follow up as scheduled.  -     CMP14+EGFR  PNA vaccination given today.   Continue all other maintenance medications.  Follow up plan: Return in about 3 months (around 11/09/2022) for chronic follow up, all labs.   Continue healthy lifestyle choices, including diet (rich in fruits, vegetables, and lean proteins, and low in salt and simple carbohydrates) and exercise (at least 30 minutes of moderate physical activity daily).  Educational handout given for DM  The above assessment and management plan was discussed with the patient. The patient verbalized understanding of and has agreed to the management plan. Patient is aware to call the clinic if they develop any new symptoms or if symptoms persist or worsen. Patient is aware when to return to the clinic for a follow-up visit. Patient educated on when it is appropriate to go to the emergency department.   Kari Baars, FNP-C [facility] [phone]

## 2022-08-09 NOTE — Telephone Encounter (Signed)
Stew aware and verbalizes understanding.  

## 2022-08-15 NOTE — Telephone Encounter (Addendum)
Left message for patient to call and discuss Prolia to see if she wants to buy and bill or send to [employer].

## 2022-08-23 NOTE — Telephone Encounter (Signed)
Called patient about by and bill or send to [employer].  Patient states she did not know just that it was approved.  I did not know to explain to patient please call

## 2022-08-23 NOTE — Telephone Encounter (Signed)
Callling nurse on prolia.

## 2022-08-29 MED ORDER — DENOSUMAB 60 MG/ML ~~LOC~~ SOSY: 60.0000 mg | PREFILLED_SYRINGE | SUBCUTANEOUS | 1 refills | Status: DC

## 2022-08-29 NOTE — Addendum Note (Signed)
Addended by: Tamera Punt on: 08/29/2022 10:17 AM   Modules accepted: Orders

## 2022-08-29 NOTE — Telephone Encounter (Signed)
Prolia sent to [facility]. Appointment scheduled on June 26th.

## 2022-08-30 ENCOUNTER — Ambulatory Visit: Payer: Medicare Other | Admitting: Family Medicine

## 2022-09-13 ENCOUNTER — Ambulatory Visit (INDEPENDENT_AMBULATORY_CARE_PROVIDER_SITE_OTHER): Payer: Medicare Other

## 2022-09-13 DIAGNOSIS — M810 Age-related osteoporosis without current pathological fracture: Secondary | ICD-10-CM

## 2022-09-13 MED ORDER — DENOSUMAB 60 MG/ML ~~LOC~~ SOSY
60.0000 mg | PREFILLED_SYRINGE | Freq: Once | SUBCUTANEOUS | Status: AC
Start: 2022-09-13 — End: 2022-09-13
  Administered 2022-09-13: 60 mg via SUBCUTANEOUS

## 2022-09-13 NOTE — Progress Notes (Signed)
Prolia injection given SQ left upper arm - patient tolerated well  

## 2022-10-03 ENCOUNTER — Other Ambulatory Visit: Payer: Self-pay | Admitting: Family Medicine

## 2022-10-03 DIAGNOSIS — J4540 Moderate persistent asthma, uncomplicated: Secondary | ICD-10-CM

## 2022-10-03 DIAGNOSIS — Z794 Long term (current) use of insulin: Secondary | ICD-10-CM

## 2022-10-03 DIAGNOSIS — K219 Gastro-esophageal reflux disease without esophagitis: Secondary | ICD-10-CM

## 2022-10-05 NOTE — Progress Notes (Signed)
[facility] - Clinic Note  10/09/2022     CHIEF COMPLAINT Patient presents for Retina Follow Up    HISTORY OF PRESENT ILLNESS: Maria [name] is a 67 y.o. female who presents to the clinic today for:   HPI     Retina Follow Up   Patient presents with  Diabetic Retinopathy.  In both eyes.  This started 6 months ago.  Duration of 6 months.  Since onset it is stable.  I, the attending physician,  performed the HPI with the patient and updated documentation appropriately.        Comments   6 month retina follow up NPDR OU pt is reporting no vision changes noticed she denies flashes or floaters pt last reading was 189 last A1C 8.0 in May       Last edited by Rennis Chris, MD on 10/09/2022  1:00 PM.    Patient states that her vision is blurry recently. Her blood sugar has been high. The doctors have changed and increase her medications. She see the diabetic doctor next month. Patient states that the diabetic doctor does not want her to go back on insulin.   Referring physician: Mateo Flow, MD [address] [place], [place] [address]  HISTORICAL INFORMATION:   Selected notes from the MEDICAL RECORD NUMBER Referred by Dr. Elmer Picker LEE: 01/28/2020 BCVA: 20/20 OU NPDR OU, PCIOL OU, possible CME OS, POAG OU    CURRENT MEDICATIONS: Current Outpatient Medications (Ophthalmic Drugs)  Medication Sig   bimatoprost (LUMIGAN) 0.01 % SOLN Place 1 drop into both eyes at bedtime.   LUMIGAN 0.01 % SOLN Place 1 drop into both eyes at bedtime.    No current facility-administered medications for this visit. (Ophthalmic Drugs)   Current Outpatient Medications (Other)  Medication Sig   acetaminophen (TYLENOL) 500 MG tablet Take 1,000 mg by mouth every 6 (six) hours as needed for moderate pain.   albuterol (VENTOLIN HFA) 108 (90 Base) MCG/ACT inhaler Inhale 2 puffs into the lungs every 6 (six) hours as needed for wheezi ng orshortness of breath.   calcium  carbonate (OSCAL) 1500 (600 Ca) MG TABS tablet Take by mouth 2 (two) times daily with a meal.   cetirizine (ZYRTEC) 10 MG tablet 1 tablet Orally Once a day for 30 day(s)   Cholecalciferol (VITAMIN D3) 50 MCG (2000 UT) capsule 1 tablet   Continuous Blood Gluc Receiver (FREESTYLE LIBRE 2 READER) DEVI Use to test blood sugar continuously. DX: E11.65   Continuous Blood Gluc Sensor (FREESTYLE LIBRE 2 SENSOR) MISC USE TO TEST BLOOD SUGAR CONTINUOUSLY   denosumab (PROLIA) 60 MG/ML SOSY injection Inject 60 mg into the skin every 6 (six) months.   FARXIGA 10 MG TABS tablet TAKE ONE TABLET DAILY BEFORE BREAKFAST   glipiZIDE (GLUCOTROL XL) 10 MG 24 hr tablet Take 2 tablets (20 mg total) by mouth daily with breakfast.   levothyroxine (SYNTHROID) 75 MCG tablet TAKE ONE TABLET BY MOUTH DAILY   lisinopril (ZESTRIL) 5 MG tablet TAKE ONE TABLET DAILY FOR HIGH BLOOD PRESSURE   metFORMIN (GLUCOPHAGE) 1000 MG tablet TAKE ONE TABLET TWICE DAILY WITH MEAL(S)   montelukast (SINGULAIR) 10 MG tablet TAKE ONE TABLET ONCE DAILY   ondansetron (ZOFRAN) 4 MG tablet Take 1 tablet (4 mg total) by mouth every 8 (eight) hours as needed for nausea or vomiting.   pantoprazole (PROTONIX) 40 MG tablet TAKE ONE TABLET ONCE DAILY   phenylephrine (SUDAFED PE) 10 MG TABS tablet Take 10 mg  by mouth every 4 (four) hours as needed (sinus congestion).   PROLIA 60 MG/ML SOSY injection Inject 60 mg into the skin every 6 (six) months.   rosuvastatin (CRESTOR) 20 MG tablet TAKE 1 TABLET DAILY   TRELEGY ELLIPTA 200-62.5-25 MCG/ACT AEPB INHALE 1 PUFF ONCE DAILY   triamcinolone cream (KENALOG) 0.1 % Apply 1 application topically 2 (two) times daily as needed (dry skin).   vitamin B-12 (CYANOCOBALAMIN) 1000 MCG tablet Take 1,000 mcg by mouth daily.   No current facility-administered medications for this visit. (Other)   REVIEW OF SYSTEMS: ROS   Positive for: Endocrine, Eyes, Psychiatric Negative for: Constitutional, Gastrointestinal,  Neurological, Skin, Genitourinary, Musculoskeletal, HENT, Cardiovascular, Respiratory, Allergic/Imm, Heme/Lymph Last edited by Etheleen Mayhew, COT on 10/09/2022  9:06 AM.      ALLERGIES Allergies  Allergen Reactions   Ozempic (0.25 Or 0.5 Mg-Dose) [Semaglutide(0.25 Or 0.5mg -Dos)] Nausea And Vomiting    N/v, no energy   PAST MEDICAL HISTORY Past Medical History:  Diagnosis Date   Allergy    Asthma    Cancer (HCC)    cervical   Complication of anesthesia    " I woke and was short of breath."   CTS (carpal tunnel syndrome)    Diabetes mellitus without complication (HCC)    Early cataracts, bilateral    Fracture    trimalleolar ankle left   GERD (gastroesophageal reflux disease)    Glaucoma    Glaucoma    History of kidney stones    " I passed it"   Hyperlipidemia    Hypertension    Hypothyroidism    Neuropathy    Vitamin D deficiency    Wears glasses    Past Surgical History:  Procedure Laterality Date   ABDOMINAL HYSTERECTOMY     APPENDECTOMY     CARDIAC CATHETERIZATION  2006   negative   COLONOSCOPY  2009   negative   INNER EAR SURGERY Right 1981   left shoulder surgery Left 10/2009   ORIF ANKLE FRACTURE Left 09/05/2018   Procedure: OPEN REDUCTION INTERNAL FIXATION TRIMALLEOLAR FRACTURE POSSIBLE SYNDEMOSIS;  Surgeon: Terance Hart, MD;  Location: [facility] OR;  Service: Orthopedics;  Laterality: Left;   TONSILLECTOMY     TUBAL LIGATION     FAMILY HISTORY Family History  Problem Relation Age of Onset   Diabetes Mother    Prostate cancer Father    Heart disease Father    Heart attack Other    Breast cancer Neg Hx    SOCIAL HISTORY Social History   Tobacco Use   Smoking status: Former    Current packs/day: 0.00    Average packs/day: 1 pack/day for 15.0 years (15.0 ttl pk-yrs)    Types: Cigarettes    Start date: 06/06/1986    Quit date: 06/05/2001    Years since quitting: 21.3   Smokeless tobacco: Never  Vaping Use   Vaping status: Never Used   Substance Use Topics   Alcohol use: No   Drug use: No       OPHTHALMIC EXAM:  Base Eye Exam     Visual Acuity (Snellen - Linear)       Right Left   Dist [place] 20/25 -2 20/30 -2   Dist ph [place] NI NI         Tonometry (Tonopen, 9:09 AM)       Right Left   Pressure 12 13         Pupils       Pupils Dark  Light Shape React APD   Right PERRL 4 3 Round Brisk None   Left PERRL 4 3 Round Brisk None         Visual Fields       Left Right    Full Full         Extraocular Movement       Right Left    Full, Ortho Full, Ortho         Neuro/Psych     Oriented x3: Yes   Mood/Affect: Normal         Dilation     Both eyes: 2.5% Phenylephrine @ 9:11 AM           Slit Lamp and Fundus Exam     Slit Lamp Exam       Right Left   Lids/Lashes Dermatochalasis - upper lid, Meibomian gland dysfunction, Telangiectasia Dermatochalasis - upper lid, Meibomian gland dysfunction, Telangiectasia   Conjunctiva/Sclera White and quiet White and quiet   Cornea 1+ Punctate epithelial erosions, arcus, well healed temporal cataract wounds, tear film debris 1+ inferior Punctate epithelial erosions, arcus, well healed temporal cataract wounds   Anterior Chamber Deep and quiet Deep and quiet   Iris Round and dilated, No NVI Round and dilated, No NVI   Lens PC IOL in good position, 1+ Posterior capsular opacification PC IOL in good position, trace Posterior capsular opacification   Anterior Vitreous mild syneresis Vitreous syneresis         Fundus Exam       Right Left   Disc Mild Pallor, Sharp rim, +cupping Mild Pallor, Sharp rim, temporal PPA   C/D Ratio 0.7 0.5   Macula Flat, Blunted foveal reflex, mild RPE mottling and clumping, scattered MA's -- greatest superior mac, focal cystic changes nasal fovea -- stably improved Flat, Blunted foveal reflex, mild RPE mottling and clumping, scattered MA/DBH, focal cluster ST to fovea, central cystic changes   Vessels attenuated,  Tortuous mild attenuation, mild tortuosity   Periphery Attached, rare MA Attached, rare MA/DBH greatest nasal to disc           Refraction     Wearing Rx       Sphere Cylinder Axis Add   Right -0.25 +1.00 016 +2.75   Left -1.00 Sphere  +2.75           IMAGING AND PROCEDURES  Imaging and Procedures for 10/09/2022  OCT, Retina - OU - Both Eyes       Right Eye Quality was good. Central Foveal Thickness: 296. Progression has been stable. Findings include normal foveal contour, no IRF, no SRF, intraretinal hyper-reflective material, vitreomacular adhesion (Blunted foveal contour, stable improvement in cystic changes).   Left Eye Quality was good. Central Foveal Thickness: 309. Progression has worsened. Findings include no SRF, abnormal foveal contour, retinal drusen (Blunted foveal contour, interval development in central cystic changes, rare drusen).   Notes *Images captured and stored on drive  Diagnosis / Impression:  OD: Blunted foveal contour, stable improvement in cystic changes OS: Blunted foveal contour, interval development in central cystic changes, rare drusen  Clinical management:  See below  Abbreviations: NFP - Normal foveal profile. CME - cystoid macular edema. PED - pigment epithelial detachment. IRF - intraretinal fluid. SRF - subretinal fluid. EZ - ellipsoid zone. ERM - epiretinal membrane. ORA - outer retinal atrophy. ORT - outer retinal tubulation. SRHM - subretinal hyper-reflective material. IRHM - intraretinal hyper-reflective material  ASSESSMENT/PLAN:    ICD-10-CM   1. Moderate nonproliferative diabetic retinopathy of both eyes without macular edema associated with type 2 diabetes mellitus (HCC)  E11.3393 OCT, Retina - OU - Both Eyes    2. Essential hypertension  I10     3. Hypertensive retinopathy of both eyes  H35.033     4. Pseudophakia, both eyes  Z96.1     5. Dry eyes  H04.123       1. Moderate nonproliferative  diabetic retinopathy, OU - Most recent A1C 8.8 on 08/09/22, 9.1 on 05/04/22, 7.1 (11.15.23)  - pt delayed to follow up from 3 months to 12 months (12.08.21-12.19.22) - FA 12.8.21 shows leaking MA, no NV OU  - BCVA today 20/20 OD, 20/25 OS - stable OU - exam w/ scattered MA/DBH OU - OCT shows OD: Blunted foveal contour, stable improvement in cystic changes; OS: Blunted foveal contour, interval development in central cystic changes, rare drusen - discussed findings, prognosis, potential treatment options and importance of f/u - discussed with the patient about possibly starting injections -- will hold off for now - no retinal or ophthalmic interventions indicated or recommended today - f/u in 4 weeks -- DFE/OCT  2,3. Hypertensive retinopathy OU - discussed importance of tight BP control - continue to monitor  4. Pseudophakia OU  - s/p CE/IOL (Dr. Elmer Picker; OS: 08.21, OD: 10.21)  - IOL in good position, doing well  - continue to monitor  5. Dry eyes OU - recommend artificial tears and lubricating ointment as needed   Ophthalmic Meds Ordered this visit:  No orders of the defined types were placed in this encounter.    Return in about 4 weeks (around 11/06/2022) for DME f/u - DFE, OCT, Possible Injxn.  There are no Patient Instructions on file for this visit.   Explained the diagnoses, plan, and follow up with the patient and they expressed understanding.  Patient expressed understanding of the importance of proper follow up care.   This document serves as a record of services personally performed by Karie Chimera, MD, PhD. It was created on their behalf by Annalee Genta, COMT. The creation of this record is the provider's dictation and/or activities during the visit.  Electronically signed by: Annalee Genta, COMT 10/09/22 1:01 PM  This document serves as a record of services personally performed by Karie Chimera, MD, PhD. It was created on their behalf by Gerilyn Nestle, COT an  ophthalmic technician. The creation of this record is the provider's dictation and/or activities during the visit.    Electronically signed by:  Charlette Caffey, COT  10/09/22 1:01 PM   Karie Chimera, M.D., Ph.D. Diseases & Surgery of the Retina and Vitreous [facility]  I have reviewed the above documentation for accuracy and completeness, and I agree with the above. Karie Chimera, M.D., Ph.D. 10/09/22 1:02 PM   Abbreviations: M myopia (nearsighted); A astigmatism; H hyperopia (farsighted); P presbyopia; Mrx spectacle prescription;  CTL contact lenses; OD right eye; OS left eye; OU both eyes  XT exotropia; ET esotropia; PEK punctate epithelial keratitis; PEE punctate epithelial erosions; DES dry eye syndrome; MGD meibomian gland dysfunction; ATs artificial tears; PFAT's preservative free artificial tears; NSC nuclear sclerotic cataract; PSC posterior subcapsular cataract; ERM epi-retinal membrane; PVD posterior vitreous detachment; RD retinal detachment; DM diabetes mellitus; DR diabetic retinopathy; NPDR non-proliferative diabetic retinopathy; PDR proliferative diabetic retinopathy; CSME clinically significant macular edema; DME diabetic macular edema; dbh dot blot hemorrhages; CWS  cotton wool spot; POAG primary open angle glaucoma; C/D cup-to-disc ratio; HVF humphrey visual field; GVF goldmann visual field; OCT optical coherence tomography; IOP intraocular pressure; BRVO Branch retinal vein occlusion; CRVO central retinal vein occlusion; CRAO central retinal artery occlusion; BRAO branch retinal artery occlusion; RT retinal tear; SB scleral buckle; PPV pars plana vitrectomy; VH Vitreous hemorrhage; PRP panretinal laser photocoagulation; IVK intravitreal kenalog; VMT vitreomacular traction; MH Macular hole;  NVD neovascularization of the disc; NVE neovascularization elsewhere; AREDS age related eye disease study; ARMD age related macular degeneration; POAG primary open  angle glaucoma; EBMD epithelial/anterior basement membrane dystrophy; ACIOL anterior chamber intraocular lens; IOL intraocular lens; PCIOL posterior chamber intraocular lens; Phaco/IOL phacoemulsification with intraocular lens placement; PRK photorefractive keratectomy; LASIK laser assisted in situ keratomileusis; HTN hypertension; DM diabetes mellitus; COPD chronic obstructive pulmonary disease

## 2022-10-09 ENCOUNTER — Encounter (INDEPENDENT_AMBULATORY_CARE_PROVIDER_SITE_OTHER): Payer: Self-pay | Admitting: Ophthalmology

## 2022-10-09 ENCOUNTER — Ambulatory Visit (INDEPENDENT_AMBULATORY_CARE_PROVIDER_SITE_OTHER): Payer: Medicare Other | Admitting: Ophthalmology

## 2022-10-09 DIAGNOSIS — H35033 Hypertensive retinopathy, bilateral: Secondary | ICD-10-CM

## 2022-10-09 DIAGNOSIS — H04123 Dry eye syndrome of bilateral lacrimal glands: Secondary | ICD-10-CM | POA: Diagnosis not present

## 2022-10-09 DIAGNOSIS — Z961 Presence of intraocular lens: Secondary | ICD-10-CM

## 2022-10-09 DIAGNOSIS — E113393 Type 2 diabetes mellitus with moderate nonproliferative diabetic retinopathy without macular edema, bilateral: Secondary | ICD-10-CM

## 2022-10-09 DIAGNOSIS — I10 Essential (primary) hypertension: Secondary | ICD-10-CM

## 2022-11-03 NOTE — Progress Notes (Signed)
[facility] - Clinic Note  11/06/2022     CHIEF COMPLAINT Patient presents for Retina Follow Up    HISTORY OF PRESENT ILLNESS: Maria [name] is a 67 y.o. female who presents to the clinic today for:   HPI     Retina Follow Up   Patient presents with  Diabetic Retinopathy.  In both eyes.  This started 4 weeks ago.  Duration of 4 weeks.  Since onset it is stable.  I, the attending physician,  performed the HPI with the patient and updated documentation appropriately.        Comments   4 week retina follow up NPDR pt is reporting no vision changes noticed she is reporting her blood sugar has been a little higher with the last reading 154 today she denies any flashes or floaters pt see her primary this Thursday       Last edited by Rennis Chris, MD on 11/06/2022 12:21 PM.    Patient states vision is stable, she states her blood sugar has gone down, but it's still not where she wants it to be, she has an appt with her PCP on Thursday  Referring physician: Mateo Flow, MD [address] [place], [place] [address]  HISTORICAL INFORMATION:   Selected notes from the MEDICAL RECORD NUMBER Referred by Dr. Elmer Picker LEE: 01/28/2020 BCVA: 20/20 OU NPDR OU, PCIOL OU, possible CME OS, POAG OU    CURRENT MEDICATIONS: Current Outpatient Medications (Ophthalmic Drugs)  Medication Sig   bimatoprost (LUMIGAN) 0.01 % SOLN Place 1 drop into both eyes at bedtime.   LUMIGAN 0.01 % SOLN Place 1 drop into both eyes at bedtime.    No current facility-administered medications for this visit. (Ophthalmic Drugs)   Current Outpatient Medications (Other)  Medication Sig   acetaminophen (TYLENOL) 500 MG tablet Take 1,000 mg by mouth every 6 (six) hours as needed for moderate pain.   albuterol (VENTOLIN HFA) 108 (90 Base) MCG/ACT inhaler Inhale 2 puffs into the lungs every 6 (six) hours as needed for wheezi ng orshortness of breath.   calcium carbonate (OSCAL) 1500 (600  Ca) MG TABS tablet Take by mouth 2 (two) times daily with a meal.   cetirizine (ZYRTEC) 10 MG tablet 1 tablet Orally Once a day for 30 day(s)   Cholecalciferol (VITAMIN D3) 50 MCG (2000 UT) capsule 1 tablet   Continuous Blood Gluc Receiver (FREESTYLE LIBRE 2 READER) DEVI Use to test blood sugar continuously. DX: E11.65   Continuous Blood Gluc Sensor (FREESTYLE LIBRE 2 SENSOR) MISC USE TO TEST BLOOD SUGAR CONTINUOUSLY   denosumab (PROLIA) 60 MG/ML SOSY injection Inject 60 mg into the skin every 6 (six) months.   FARXIGA 10 MG TABS tablet TAKE ONE TABLET DAILY BEFORE BREAKFAST   glipiZIDE (GLUCOTROL XL) 10 MG 24 hr tablet Take 2 tablets (20 mg total) by mouth daily with breakfast.   levothyroxine (SYNTHROID) 75 MCG tablet TAKE ONE TABLET BY MOUTH DAILY   lisinopril (ZESTRIL) 5 MG tablet TAKE ONE TABLET DAILY FOR HIGH BLOOD PRESSURE   metFORMIN (GLUCOPHAGE) 1000 MG tablet TAKE ONE TABLET TWICE DAILY WITH MEAL(S)   montelukast (SINGULAIR) 10 MG tablet TAKE ONE TABLET ONCE DAILY   ondansetron (ZOFRAN) 4 MG tablet Take 1 tablet (4 mg total) by mouth every 8 (eight) hours as needed for nausea or vomiting.   pantoprazole (PROTONIX) 40 MG tablet TAKE ONE TABLET ONCE DAILY   phenylephrine (SUDAFED PE) 10 MG TABS tablet Take 10 mg by  mouth every 4 (four) hours as needed (sinus congestion).   PROLIA 60 MG/ML SOSY injection Inject 60 mg into the skin every 6 (six) months.   rosuvastatin (CRESTOR) 20 MG tablet TAKE 1 TABLET DAILY   TRELEGY ELLIPTA 200-62.5-25 MCG/ACT AEPB INHALE 1 PUFF ONCE DAILY   triamcinolone cream (KENALOG) 0.1 % Apply 1 application topically 2 (two) times daily as needed (dry skin).   vitamin B-12 (CYANOCOBALAMIN) 1000 MCG tablet Take 1,000 mcg by mouth daily.   No current facility-administered medications for this visit. (Other)   REVIEW OF SYSTEMS: ROS   Positive for: Endocrine, Eyes, Psychiatric Negative for: Constitutional, Gastrointestinal, Neurological, Skin, Genitourinary,  Musculoskeletal, HENT, Cardiovascular, Respiratory, Allergic/Imm, Heme/Lymph Last edited by Etheleen Mayhew, COT on 11/06/2022  8:55 AM.     ALLERGIES Allergies  Allergen Reactions   Ozempic (0.25 Or 0.5 Mg-Dose) [Semaglutide(0.25 Or 0.5mg -Dos)] Nausea And Vomiting    N/v, no energy   PAST MEDICAL HISTORY Past Medical History:  Diagnosis Date   Allergy    Asthma    Cancer (HCC)    cervical   Complication of anesthesia    " I woke and was short of breath."   CTS (carpal tunnel syndrome)    Diabetes mellitus without complication (HCC)    Early cataracts, bilateral    Fracture    trimalleolar ankle left   GERD (gastroesophageal reflux disease)    Glaucoma    Glaucoma    History of kidney stones    " I passed it"   Hyperlipidemia    Hypertension    Hypothyroidism    Neuropathy    Vitamin D deficiency    Wears glasses    Past Surgical History:  Procedure Laterality Date   ABDOMINAL HYSTERECTOMY     APPENDECTOMY     CARDIAC CATHETERIZATION  2006   negative   COLONOSCOPY  2009   negative   INNER EAR SURGERY Right 1981   left shoulder surgery Left 10/2009   ORIF ANKLE FRACTURE Left 09/05/2018   Procedure: OPEN REDUCTION INTERNAL FIXATION TRIMALLEOLAR FRACTURE POSSIBLE SYNDEMOSIS;  Surgeon: Terance Hart, MD;  Location: [facility] OR;  Service: Orthopedics;  Laterality: Left;   TONSILLECTOMY     TUBAL LIGATION     FAMILY HISTORY Family History  Problem Relation Age of Onset   Diabetes Mother    Prostate cancer Father    Heart disease Father    Heart attack Other    Breast cancer Neg Hx    SOCIAL HISTORY Social History   Tobacco Use   Smoking status: Former    Current packs/day: 0.00    Average packs/day: 1 pack/day for 15.0 years (15.0 ttl pk-yrs)    Types: Cigarettes    Start date: 06/06/1986    Quit date: 06/05/2001    Years since quitting: 21.4   Smokeless tobacco: Never  Vaping Use   Vaping status: Never Used  Substance Use Topics   Alcohol use:  No   Drug use: No       OPHTHALMIC EXAM:  Base Eye Exam     Visual Acuity (Snellen - Linear)       Right Left   Dist [place] 20/25 -1 20/30 -2   Dist ph  NI 20/25 -1         Tonometry (Tonopen, 8:59 AM)       Right Left   Pressure 12 13         Pupils       Pupils Dark Light  Shape React APD   Right PERRL 4 3 Round Brisk None   Left PERRL 4 3 Round Brisk None         Visual Fields       Left Right    Full Full         Extraocular Movement       Right Left    Full, Ortho Full, Ortho         Neuro/Psych     Oriented x3: Yes   Mood/Affect: Normal         Dilation     Both eyes: 2.5% Phenylephrine @ 8:59 AM           Slit Lamp and Fundus Exam     Slit Lamp Exam       Right Left   Lids/Lashes Dermatochalasis - upper lid, Meibomian gland dysfunction Dermatochalasis - upper lid, Meibomian gland dysfunction, mild Telangiectasia   Conjunctiva/Sclera White and quiet White and quiet   Cornea Trace Punctate epithelial erosions, arcus, well healed temporal cataract wounds, tear film debris Trace Punctate epithelial erosions, arcus, well healed temporal cataract wounds, tear film debris   Anterior Chamber deep and clear deep and clear   Iris Round and dilated, No NVI Round and dilated, No NVI   Lens PC IOL in good position, 1+ Posterior capsular opacification PC IOL in good position, trace Posterior capsular opacification   Anterior Vitreous mild syneresis mild syneresis         Fundus Exam       Right Left   Disc Mild Pallor, Sharp rim, +cupping Mild Pallor, Sharp rim, temporal PPA   C/D Ratio 0.7 0.5   Macula Flat, Blunted foveal reflex, mild RPE mottling and clumping, scattered MA's -- greatest superior mac, focal cystic changes nasal fovea -- stably improved Flat, Blunted foveal reflex, mild RPE mottling and clumping, scattered MA/DBH, focal cluster ST to fovea, central cystic changes -- slightly improved   Vessels attenuated, mild  tortuosity attenuated, mild tortuosity   Periphery Attached, rare MA Attached, rare MA/DBH greatest nasal to disc           Refraction     Wearing Rx       Sphere Cylinder Axis Add   Right -0.25 +1.00 016 +2.75   Left -1.00 Sphere  +2.75           IMAGING AND PROCEDURES  Imaging and Procedures for 11/06/2022  OCT, Retina - OU - Both Eyes       Right Eye Quality was good. Central Foveal Thickness: 294. Progression has been stable. Findings include no IRF, no SRF, abnormal foveal contour, intraretinal hyper-reflective material, vitreomacular adhesion (Blunted foveal contour, stable improvement in cystic changes).   Left Eye Quality was good. Central Foveal Thickness: 306. Progression has improved. Findings include no SRF, abnormal foveal contour, retinal drusen (Blunted foveal contour, persistent central cystic changes -- slightly improved, rare drusen).   Notes *Images captured and stored on drive  Diagnosis / Impression:  OD: Blunted foveal contour, stable improvement in cystic changes OS: Blunted foveal contour, persistent central cystic changes -- slightly improved, rare drusen  Clinical management:  See below  Abbreviations: NFP - Normal foveal profile. CME - cystoid macular edema. PED - pigment epithelial detachment. IRF - intraretinal fluid. SRF - subretinal fluid. EZ - ellipsoid zone. ERM - epiretinal membrane. ORA - outer retinal atrophy. ORT - outer retinal tubulation. SRHM - subretinal hyper-reflective material. IRHM - intraretinal hyper-reflective material  ASSESSMENT/PLAN:    ICD-10-CM   1. Moderate nonproliferative diabetic retinopathy of both eyes without macular edema associated with type 2 diabetes mellitus (HCC)  E11.3393 OCT, Retina - OU - Both Eyes    2. Essential hypertension  I10     3. Hypertensive retinopathy of both eyes  H35.033     4. Pseudophakia, both eyes  Z96.1     5. Dry eyes  H04.123      1. Moderate  nonproliferative diabetic retinopathy, OU - Most recent A1C 8.8 on 08/09/22, 9.1 on 05/04/22, 7.1 (11.15.23)  - pt delayed to follow up from 3 months to 12 months (12.08.21-12.19.22) - FA 12.8.21 shows leaking MA, no NV OU  - BCVA today 20/20 OD, 20/25 OS - stable OU - exam w/ scattered MA/DBH OU - OCT shows OD: Blunted foveal contour, stable improvement in cystic changes; OS: Blunted foveal contour, persistent central cystic changes -- slightly improved, rare drusen - discussed findings, prognosis, potential treatment options and importance of f/u - no retinal or ophthalmic interventions indicated or recommended today - f/u in 3-4 months, sooner prn -- DFE/OCT  2,3. Hypertensive retinopathy OU - discussed importance of tight BP control  - continue to monitor  4. Pseudophakia OU  - s/p CE/IOL (Dr. Elmer Picker; OS: 08.21, OD: 10.21)  - IOL in good position, doing well  - continue to monitor  5. Dry eyes OU - recommend artificial tears and lubricating ointment as needed  Ophthalmic Meds Ordered this visit:  No orders of the defined types were placed in this encounter.    Return for f/u 3-4 months, NPDR OU, DFE, OCT.  There are no Patient Instructions on file for this visit.   Explained the diagnoses, plan, and follow up with the patient and they expressed understanding.  Patient expressed understanding of the importance of proper follow up care.   This document serves as a record of services personally performed by Karie Chimera, MD, PhD. It was created on their behalf by Annalee Genta, COMT. The creation of this record is the provider's dictation and/or activities during the visit.  Electronically signed by: Annalee Genta, COMT 11/06/22 12:30 PM  This document serves as a record of services personally performed by Karie Chimera, MD, PhD. It was created on their behalf by Glee Arvin. Manson Passey, OA an ophthalmic technician. The creation of this record is the provider's dictation and/or  activities during the visit.    Electronically signed by: Glee Arvin. Manson Passey, OA 11/06/22 12:30 PM  Karie Chimera, M.D., Ph.D. Diseases & Surgery of the Retina and Vitreous [facility]  I have reviewed the above documentation for accuracy and completeness, and I agree with the above. Karie Chimera, M.D., Ph.D. 11/06/22 12:30 PM  Abbreviations: M myopia (nearsighted); A astigmatism; H hyperopia (farsighted); P presbyopia; Mrx spectacle prescription;  CTL contact lenses; OD right eye; OS left eye; OU both eyes  XT exotropia; ET esotropia; PEK punctate epithelial keratitis; PEE punctate epithelial erosions; DES dry eye syndrome; MGD meibomian gland dysfunction; ATs artificial tears; PFAT's preservative free artificial tears; NSC nuclear sclerotic cataract; PSC posterior subcapsular cataract; ERM epi-retinal membrane; PVD posterior vitreous detachment; RD retinal detachment; DM diabetes mellitus; DR diabetic retinopathy; NPDR non-proliferative diabetic retinopathy; PDR proliferative diabetic retinopathy; CSME clinically significant macular edema; DME diabetic macular edema; dbh dot blot hemorrhages; CWS cotton wool spot; POAG primary open angle glaucoma; C/D cup-to-disc ratio; HVF humphrey visual field; GVF goldmann visual field; OCT optical coherence  tomography; IOP intraocular pressure; BRVO Branch retinal vein occlusion; CRVO central retinal vein occlusion; CRAO central retinal artery occlusion; BRAO branch retinal artery occlusion; RT retinal tear; SB scleral buckle; PPV pars plana vitrectomy; VH Vitreous hemorrhage; PRP panretinal laser photocoagulation; IVK intravitreal kenalog; VMT vitreomacular traction; MH Macular hole;  NVD neovascularization of the disc; NVE neovascularization elsewhere; AREDS age related eye disease study; ARMD age related macular degeneration; POAG primary open angle glaucoma; EBMD epithelial/anterior basement membrane dystrophy; ACIOL anterior chamber  intraocular lens; IOL intraocular lens; PCIOL posterior chamber intraocular lens; Phaco/IOL phacoemulsification with intraocular lens placement; PRK photorefractive keratectomy; LASIK laser assisted in situ keratomileusis; HTN hypertension; DM diabetes mellitus; COPD chronic obstructive pulmonary disease

## 2022-11-06 ENCOUNTER — Encounter (INDEPENDENT_AMBULATORY_CARE_PROVIDER_SITE_OTHER): Payer: Self-pay | Admitting: Ophthalmology

## 2022-11-06 ENCOUNTER — Ambulatory Visit (INDEPENDENT_AMBULATORY_CARE_PROVIDER_SITE_OTHER): Payer: Medicare Other | Admitting: Ophthalmology

## 2022-11-06 DIAGNOSIS — H35033 Hypertensive retinopathy, bilateral: Secondary | ICD-10-CM

## 2022-11-06 DIAGNOSIS — Z961 Presence of intraocular lens: Secondary | ICD-10-CM | POA: Diagnosis not present

## 2022-11-06 DIAGNOSIS — H04123 Dry eye syndrome of bilateral lacrimal glands: Secondary | ICD-10-CM | POA: Diagnosis not present

## 2022-11-06 DIAGNOSIS — I10 Essential (primary) hypertension: Secondary | ICD-10-CM | POA: Diagnosis not present

## 2022-11-06 DIAGNOSIS — E113393 Type 2 diabetes mellitus with moderate nonproliferative diabetic retinopathy without macular edema, bilateral: Secondary | ICD-10-CM | POA: Diagnosis not present

## 2022-11-09 ENCOUNTER — Encounter: Payer: Self-pay | Admitting: Family Medicine

## 2022-11-09 ENCOUNTER — Ambulatory Visit (INDEPENDENT_AMBULATORY_CARE_PROVIDER_SITE_OTHER): Payer: Medicare Other | Admitting: Family Medicine

## 2022-11-09 VITALS — BP 111/69 | HR 78 | Temp 97.2°F | Ht 62.0 in | Wt 111.4 lb

## 2022-11-09 DIAGNOSIS — Z794 Long term (current) use of insulin: Secondary | ICD-10-CM | POA: Diagnosis not present

## 2022-11-09 DIAGNOSIS — E1159 Type 2 diabetes mellitus with other circulatory complications: Secondary | ICD-10-CM | POA: Diagnosis not present

## 2022-11-09 DIAGNOSIS — E785 Hyperlipidemia, unspecified: Secondary | ICD-10-CM | POA: Diagnosis not present

## 2022-11-09 DIAGNOSIS — E1169 Type 2 diabetes mellitus with other specified complication: Secondary | ICD-10-CM | POA: Diagnosis not present

## 2022-11-09 DIAGNOSIS — I152 Hypertension secondary to endocrine disorders: Secondary | ICD-10-CM

## 2022-11-09 DIAGNOSIS — E113393 Type 2 diabetes mellitus with moderate nonproliferative diabetic retinopathy without macular edema, bilateral: Secondary | ICD-10-CM

## 2022-11-09 LAB — CMP14+EGFR
ALT: 27 IU/L (ref 0–32)
AST: 20 IU/L (ref 0–40)
Albumin: 4.3 g/dL (ref 3.9–4.9)
Alkaline Phosphatase: 100 IU/L (ref 44–121)
BUN/Creatinine Ratio: 16 (ref 12–28)
BUN: 10 mg/dL (ref 8–27)
Bilirubin Total: 0.8 mg/dL (ref 0.0–1.2)
CO2: 20 mmol/L (ref 20–29)
Calcium: 9.1 mg/dL (ref 8.7–10.3)
Chloride: 104 mmol/L (ref 96–106)
Creatinine, Ser: 0.63 mg/dL (ref 0.57–1.00)
Globulin, Total: 2 g/dL (ref 1.5–4.5)
Glucose: 226 mg/dL — ABNORMAL HIGH (ref 70–99)
Potassium: 4 mmol/L (ref 3.5–5.2)
Sodium: 140 mmol/L (ref 134–144)
Total Protein: 6.3 g/dL (ref 6.0–8.5)
eGFR: 98 mL/min/{1.73_m2} (ref >59)

## 2022-11-09 LAB — BAYER DCA HB A1C WAIVED: HB A1C (BAYER DCA - WAIVED): 8.1 % — ABNORMAL HIGH (ref 4.8–5.6)

## 2022-11-09 MED ORDER — GLIPIZIDE ER 10 MG PO TB24
20.0000 mg | ORAL_TABLET | Freq: Every day | ORAL | 1 refills | Status: DC
Start: 2022-11-09 — End: 2023-02-09

## 2022-11-09 MED ORDER — ROSUVASTATIN CALCIUM 20 MG PO TABS: 20.0000 mg | ORAL_TABLET | Freq: Every day | ORAL | 0 refills | Status: DC

## 2022-11-09 NOTE — Patient Instructions (Signed)

## 2022-11-09 NOTE — Progress Notes (Signed)
Subjective:  Patient ID: Maria [name], female    DOB: 1956/02/02, 67 y.o.   MRN: [id number]  Patient Care Team: Sonny Masters, FNP as PCP - General (Family Medicine) Danella Maiers, [facility] as Pharmacist (Family Medicine)   Chief Complaint:  Medical Management of Chronic Issues (3 month chronic follow up )   HPI: Maria [name] is a 67 y.o. female presenting on 11/09/2022 for Medical Management of Chronic Issues (3 month chronic follow up )   1. Type 2 diabetes mellitus with other specified complication, with long-term current use of insulin (HCC) Pt is compliant with medications and she is on metformin, glipizide, and farxiga. She has tried Ozempic in the past and did not tolerate this. She has not tried [employer]. States over the last several weeks she has been having high readings at night and in the mornings. States in the low 200 range. Denies polyuria, polyphagia, or polydipsia. States she has had some increased blurred vision and has seen Dr. Vanessa Barbara for this.   2. Hyperlipidemia associated with type 2 diabetes mellitus (HCC) Compliant with medications - Yes Current medications - rosuvastatin  Side effects from medications - No Diet - generally healthy  Exercise - active daily, no regular exercise  3. Hypertension associated with type 2 diabetes mellitus (HCC) Complaint with meds - Yes Current Medications - Lisinopril Checking BP at home - no Exercising Regularly - No Watching Salt intake - Yes Pertinent ROS:  Headache - No Fatigue - No Visual Disturbances - Yes Chest pain - No Dyspnea - No Palpitations - No LE edema - No They report good compliance with medications and can restate their regimen by memory. No medication side effects.  BP Readings from Last 3 Encounters:  11/09/22 111/69  08/09/22 108/69  05/04/22 119/65     4. Moderate nonproliferative diabetic retinopathy of both eyes without macular edema associated with type 2 diabetes mellitus  (HCC) Followed by Dr. Vanessa Barbara on a regular basis. May need to start injections.     Relevant past medical, surgical, family, and social history reviewed and updated as indicated.  Allergies and medications reviewed and updated. Data reviewed: Chart in Epic.   Past Medical History:  Diagnosis Date   Allergy    Asthma    Cancer (HCC)    cervical   Complication of anesthesia    " I woke and was short of breath."   CTS (carpal tunnel syndrome)    Diabetes mellitus without complication (HCC)    Early cataracts, bilateral    Fracture    trimalleolar ankle left   GERD (gastroesophageal reflux disease)    Glaucoma    Glaucoma    History of kidney stones    " I passed it"   Hyperlipidemia    Hypertension    Hypothyroidism    Neuropathy    Vitamin D deficiency    Wears glasses     Past Surgical History:  Procedure Laterality Date   ABDOMINAL HYSTERECTOMY     APPENDECTOMY     CARDIAC CATHETERIZATION  2006   negative   COLONOSCOPY  2009   negative   INNER EAR SURGERY Right 1981   left shoulder surgery Left 10/2009   ORIF ANKLE FRACTURE Left 09/05/2018   Procedure: OPEN REDUCTION INTERNAL FIXATION TRIMALLEOLAR FRACTURE POSSIBLE SYNDEMOSIS;  Surgeon: Terance Hart, MD;  Location: [facility] OR;  Service: Orthopedics;  Laterality: Left;   TONSILLECTOMY     TUBAL LIGATION  Social History   Socioeconomic History   Marital status: Married    Spouse name: Loraine Leriche   Number of children: 2   Years of education: Not on file   Highest education level: Not on file  Occupational History   Not on file  Tobacco Use   Smoking status: Former    Current packs/day: 0.00    Average packs/day: 1 pack/day for 15.0 years (15.0 ttl pk-yrs)    Types: Cigarettes    Start date: 06/06/1986    Quit date: 06/05/2001    Years since quitting: 21.4   Smokeless tobacco: Never  Vaping Use   Vaping status: Never Used  Substance and Sexual Activity   Alcohol use: No   Drug use: No   Sexual  activity: Not Currently  Other Topics Concern   Not on file  Social History Narrative   2 sons.   3 grandchildren.   Social Determinants of Health   Financial Resource Strain: Low Risk  (02/20/2022)   Overall Financial Resource Strain (CARDIA)    Difficulty of Paying Living Expenses: Not hard at all  Food Insecurity: No Food Insecurity (02/20/2022)   Hunger Vital Sign    Worried About Running Out of Food in the Last Year: Never true    Ran Out of Food in the Last Year: Never true  Transportation Needs: No Transportation Needs (02/20/2022)   PRAPARE - Administrator, Civil Service (Medical): No    Lack of Transportation (Non-Medical): No  Physical Activity: Insufficiently Active (02/20/2022)   Exercise Vital Sign    Days of Exercise per Week: 3 days    Minutes of Exercise per Session: 30 min  Stress: No Stress Concern Present (02/20/2022)   [employer] of Occupational Health - Occupational Stress Questionnaire    Feeling of Stress : Not at all  Social Connections: Moderately Integrated (02/20/2022)   Social Connection and Isolation Panel [NHANES]    Frequency of Communication with Friends and Family: More than three times a week    Frequency of Social Gatherings with Friends and Family: More than three times a week    Attends Religious Services: 1 to 4 times per year    Active Member of [employer] or Organizations: No    Attends Banker Meetings: Never    Marital Status: Married  Catering manager Violence: Not At Risk (02/20/2022)   Humiliation, Afraid, Rape, and Kick questionnaire    Fear of Current or Ex-Partner: No    Emotionally Abused: No    Physically Abused: No    Sexually Abused: No    Outpatient Encounter Medications as of 11/09/2022  Medication Sig   acetaminophen (TYLENOL) 500 MG tablet Take 1,000 mg by mouth every 6 (six) hours as needed for moderate pain.   albuterol (VENTOLIN HFA) 108 (90 Base) MCG/ACT inhaler Inhale 2 puffs into the lungs  every 6 (six) hours as needed for wheezi ng orshortness of breath.   bimatoprost (LUMIGAN) 0.01 % SOLN Place 1 drop into both eyes at bedtime.   calcium carbonate (OSCAL) 1500 (600 Ca) MG TABS tablet Take by mouth 2 (two) times daily with a meal.   cetirizine (ZYRTEC) 10 MG tablet 1 tablet Orally Once a day for 30 day(s)   Cholecalciferol (VITAMIN D3) 50 MCG (2000 UT) capsule 1 tablet   Continuous Blood Gluc Receiver (FREESTYLE LIBRE 2 READER) DEVI Use to test blood sugar continuously. DX: E11.65   Continuous Blood Gluc Sensor (FREESTYLE LIBRE 2 SENSOR) MISC USE  TO TEST BLOOD SUGAR CONTINUOUSLY   denosumab (PROLIA) 60 MG/ML SOSY injection Inject 60 mg into the skin every 6 (six) months.   FARXIGA 10 MG TABS tablet TAKE ONE TABLET DAILY BEFORE BREAKFAST   levothyroxine (SYNTHROID) 75 MCG tablet TAKE ONE TABLET BY MOUTH DAILY   lisinopril (ZESTRIL) 5 MG tablet TAKE ONE TABLET DAILY FOR HIGH BLOOD PRESSURE   LUMIGAN 0.01 % SOLN Place 1 drop into both eyes at bedtime.    metFORMIN (GLUCOPHAGE) 1000 MG tablet TAKE ONE TABLET TWICE DAILY WITH MEAL(S)   montelukast (SINGULAIR) 10 MG tablet TAKE ONE TABLET ONCE DAILY   ondansetron (ZOFRAN) 4 MG tablet Take 1 tablet (4 mg total) by mouth every 8 (eight) hours as needed for nausea or vomiting.   pantoprazole (PROTONIX) 40 MG tablet TAKE ONE TABLET ONCE DAILY   phenylephrine (SUDAFED PE) 10 MG TABS tablet Take 10 mg by mouth every 4 (four) hours as needed (sinus congestion).   PROLIA 60 MG/ML SOSY injection Inject 60 mg into the skin every 6 (six) months.   TRELEGY ELLIPTA 200-62.5-25 MCG/ACT AEPB INHALE 1 PUFF ONCE DAILY   triamcinolone cream (KENALOG) 0.1 % Apply 1 application topically 2 (two) times daily as needed (dry skin).   vitamin B-12 (CYANOCOBALAMIN) 1000 MCG tablet Take 1,000 mcg by mouth daily.   [DISCONTINUED] rosuvastatin (CRESTOR) 20 MG tablet TAKE 1 TABLET DAILY   glipiZIDE (GLUCOTROL XL) 10 MG 24 hr tablet Take 2 tablets (20 mg total) by  mouth daily with breakfast.   rosuvastatin (CRESTOR) 20 MG tablet Take 1 tablet (20 mg total) by mouth daily.   [DISCONTINUED] glipiZIDE (GLUCOTROL XL) 10 MG 24 hr tablet Take 2 tablets (20 mg total) by mouth daily with breakfast.   No facility-administered encounter medications on file as of 11/09/2022.    Allergies  Allergen Reactions   Ozempic (0.25 Or 0.5 Mg-Dose) [Semaglutide(0.25 Or 0.5mg -Dos)] Nausea And Vomiting    N/v, no energy    Review of Systems  Constitutional:  Negative for activity change, appetite change, chills, diaphoresis, fatigue, fever and unexpected weight change.  HENT: Negative.    Eyes:  Positive for visual disturbance. Negative for photophobia.  Respiratory:  Negative for cough, chest tightness and shortness of breath.   Cardiovascular:  Negative for chest pain, palpitations and leg swelling.  Gastrointestinal:  Negative for abdominal pain, blood in stool, constipation, diarrhea, nausea and vomiting.  Endocrine: Negative.  Negative for cold intolerance, heat intolerance, polydipsia, polyphagia and polyuria.  Genitourinary:  Negative for decreased urine volume, difficulty urinating, dysuria, frequency and urgency.  Musculoskeletal:  Negative for arthralgias and myalgias.  Skin: Negative.   Allergic/Immunologic: Negative.   Neurological:  Negative for dizziness, tremors, seizures, syncope, facial asymmetry, speech difficulty, weakness, light-headedness, numbness and headaches.  Hematological: Negative.   Psychiatric/Behavioral:  Negative for confusion, hallucinations, sleep disturbance and suicidal ideas.   All other systems reviewed and are negative.       Objective:  BP 111/69   Pulse 78   Temp (!) 97.2 F (36.2 C) (Temporal)   Ht 5\' 2"  (1.575 m)   Wt 111 lb 6.4 oz (50.5 kg)   SpO2 97%   BMI 20.38 kg/m    Wt Readings from Last 3 Encounters:  11/09/22 111 lb 6.4 oz (50.5 kg)  08/09/22 110 lb 9.6 oz (50.2 kg)  05/04/22 113 lb 12.8 oz (51.6 kg)     Physical Exam Vitals and nursing note reviewed.  Constitutional:      General: She  is not in acute distress.    Appearance: Normal appearance. She is well-developed, well-groomed and normal weight. She is not ill-appearing, toxic-appearing or diaphoretic.  HENT:     Head: Normocephalic and atraumatic.     Jaw: There is normal jaw occlusion.     Right Ear: Hearing normal.     Left Ear: Hearing normal.     Nose: Nose normal.     Mouth/Throat:     Lips: Pink.     Mouth: Mucous membranes are moist.     Pharynx: Oropharynx is clear. Uvula midline.  Eyes:     General: Lids are normal.     Extraocular Movements: Extraocular movements intact.     Conjunctiva/sclera: Conjunctivae normal.     Pupils: Pupils are equal, round, and reactive to light.  Neck:     Thyroid: No thyroid mass, thyromegaly or thyroid tenderness.     Vascular: No carotid bruit or JVD.     Trachea: Trachea and phonation normal.  Cardiovascular:     Rate and Rhythm: Normal rate and regular rhythm.     Chest Wall: PMI is not displaced.     Pulses: Normal pulses.          Dorsalis pedis pulses are 2+ on the right side and 2+ on the left side.       Posterior tibial pulses are 2+ on the right side and 2+ on the left side.     Heart sounds: Normal heart sounds. No murmur heard.    No friction rub. No gallop.  Pulmonary:     Effort: Pulmonary effort is normal. No respiratory distress.     Breath sounds: Normal breath sounds. No wheezing.  Abdominal:     General: Bowel sounds are normal. There is no distension or abdominal bruit.     Palpations: Abdomen is soft. There is no hepatomegaly or splenomegaly.     Tenderness: There is no abdominal tenderness. There is no right CVA tenderness or left CVA tenderness.     Hernia: No hernia is present.  Musculoskeletal:        General: Normal range of motion.     Cervical back: Normal range of motion and neck supple.     Right lower leg: No edema.     Left lower leg: No  edema.       Feet:  Feet:     Right foot:     Protective Sensation: 10 sites tested.  10 sites sensed.     Left foot:     Protective Sensation: 10 sites tested.  10 sites sensed.  Lymphadenopathy:     Cervical: No cervical adenopathy.  Skin:    General: Skin is warm and dry.     Capillary Refill: Capillary refill takes less than 2 seconds.     Coloration: Skin is not cyanotic, jaundiced or pale.     Findings: No rash.  Neurological:     General: No focal deficit present.     Mental Status: She is alert and oriented to person, place, and time.     Sensory: Sensation is intact.     Motor: Motor function is intact.     Coordination: Coordination is intact.     Gait: Gait is intact.     Deep Tendon Reflexes: Reflexes are normal and symmetric.  Psychiatric:        Attention and Perception: Attention and perception normal.        Mood and Affect: Mood and affect normal.  Speech: Speech normal.        Behavior: Behavior normal. Behavior is cooperative.        Thought Content: Thought content normal.        Cognition and Memory: Cognition and memory normal.        Judgment: Judgment normal.     Results for orders placed or performed in visit on 08/09/22  Bayer DCA Hb A1c Waived  Result Value Ref Range   HB A1C (BAYER DCA - WAIVED) 8.8 (H) 4.8 - 5.6 %  CMP14+EGFR  Result Value Ref Range   Glucose 245 (H) 70 - 99 mg/dL   BUN 12 8 - 27 mg/dL   Creatinine, Ser 4.09 0.57 - 1.00 mg/dL   eGFR 96 >81 XB/JYN/8.29   BUN/Creatinine Ratio 17 12 - 28   Sodium 138 134 - 144 mmol/L   Potassium 4.0 3.5 - 5.2 mmol/L   Chloride 101 96 - 106 mmol/L   CO2 20 20 - 29 mmol/L   Calcium 9.2 8.7 - 10.3 mg/dL   Total Protein 6.6 6.0 - 8.5 g/dL   Albumin 4.3 3.9 - 4.9 g/dL   Globulin, Total 2.3 1.5 - 4.5 g/dL   Albumin/Globulin Ratio 1.9 1.2 - 2.2   Bilirubin Total 0.9 0.0 - 1.2 mg/dL   Alkaline Phosphatase 98 44 - 121 IU/L   AST 17 0 - 40 IU/L   ALT 24 0 - 32 IU/L       Pertinent  labs & imaging results that were available during my care of the patient were reviewed by me and considered in my medical decision making.  Assessment & Plan:  Taquia was seen today for medical management of chronic issues.  Diagnoses and all orders for this visit:  Type 2 diabetes mellitus with other specified complication, with long-term current use of insulin (HCC) A1C 8.1. Would like to trial [facility], will contact rep to see if we can get a sample. Diet and exercise encouraged. Report continued high readings.  -     CMP14+EGFR -     Bayer DCA Hb A1c Waived -     glipiZIDE (GLUCOTROL XL) 10 MG 24 hr tablet; Take 2 tablets (20 mg total) by mouth daily with breakfast.  Hyperlipidemia associated with type 2 diabetes mellitus (HCC) Diet encouraged - increase intake of fresh fruits and vegetables, increase intake of lean proteins. Bake, broil, or grill foods. Avoid fried, greasy, and fatty foods. Avoid fast foods. Increase intake of fiber-rich whole grains. Exercise encouraged - at least 150 minutes per week and advance as tolerated.  Goal BMI < 25. Continue medications as prescribed. Follow up in 3-6 months as discussed.  -     CMP14+EGFR -     Bayer DCA Hb A1c Waived -     rosuvastatin (CRESTOR) 20 MG tablet; Take 1 tablet (20 mg total) by mouth daily.  Hypertension associated with type 2 diabetes mellitus (HCC) BP well controlled. Changes were not made in regimen today. Goal BP is 130/80. Pt aware to report any persistent high or low readings. DASH diet and exercise encouraged. Exercise at least 150 minutes per week and increase as tolerated. Goal BMI > 25. Stress management encouraged. Avoid nicotine and tobacco product use. Avoid excessive alcohol and NSAID's. Avoid more than 2000 mg of sodium daily. Medications as prescribed. Follow up as scheduled.  -     CMP14+EGFR -     Bayer DCA Hb A1c Waived  Moderate nonproliferative diabetic retinopathy of both eyes  without macular edema associated  with type 2 diabetes mellitus (HCC) Followed by specialist on a regular basis.  -     CMP14+EGFR -     Bayer DCA Hb A1c Waived     Continue all other maintenance medications.  Follow up plan: Return in about 3 months (around 02/09/2023) for chronic follow up, all labs.   Continue healthy lifestyle choices, including diet (rich in fruits, vegetables, and lean proteins, and low in salt and simple carbohydrates) and exercise (at least 30 minutes of moderate physical activity daily).  Educational handout given for DM  The above assessment and management plan was discussed with the patient. The patient verbalized understanding of and has agreed to the management plan. Patient is aware to call the clinic if they develop any new symptoms or if symptoms persist or worsen. Patient is aware when to return to the clinic for a follow-up visit. Patient educated on when it is appropriate to go to the emergency department.   Kari Baars, FNP-C [facility] [phone]

## 2022-11-14 ENCOUNTER — Telehealth (INDEPENDENT_AMBULATORY_CARE_PROVIDER_SITE_OTHER): Payer: Medicare Other | Admitting: Pharmacist

## 2022-11-14 DIAGNOSIS — E1169 Type 2 diabetes mellitus with other specified complication: Secondary | ICD-10-CM

## 2022-11-14 DIAGNOSIS — Z794 Long term (current) use of insulin: Secondary | ICD-10-CM

## 2022-11-29 NOTE — Telephone Encounter (Signed)
11/14/2022 Name: Maria [name] MRN: [id number] DOB: 02/20/1956  Chief Complaint  Patient presents with   Medication Management    Maria [name] is a 67 y.o. year old female who presented for a telephone visit.  I connected with  Maria [name] on 11/14/22 by telephone and verified that I am speaking with the correct person using two identifiers. I discussed the limitations of evaluation and management by telemedicine. The patient expressed understanding and agreed to proceed.  Patient was located in her home and PharmD in PCP office during this visit.   They were referred to the pharmacist by their PCP for assistance in managing diabetes.    Subjective:  Care Team: Primary Care Provider: Sonny Masters, FNP ; Medication Access/Adherence  Current Pharmacy:  [facility] [place], [place] - [address] [place] [address] Phone: [phone] Fax: [phone]  [facility] - [place], [place] - [address] [facility] [address] [place] [address] Phone: [phone] Fax: [phone]   Patient reports affordability concerns with their medications: No  Patient reports access/transportation concerns to their pharmacy: No  Patient reports adherence concerns with their medications:  No     Diabetes:  Current medications: recently started Ozempic, lantus 20 units (decreased from 40 units), metformin, glipizide-->now on Mounjaro (couldn't tolerate ozempic)  Current glucose readings: on libre 2 CGM ( A1c 8.1--BG running higher)  Patient denies hypoglycemic s/sx including dizziness, shakiness, sweating. Patient denies hyperglycemic symptoms including polyuria, polydipsia, polyphagia, nocturia, neuropathy, blurred vision.  Objective:  Lab Results  Component Value Date   HGBA1C 8.1 (H) 11/09/2022    Lab Results  Component Value Date   CREATININE 0.63 11/09/2022   BUN 10 11/09/2022   NA  140 11/09/2022   K 4.0 11/09/2022   CL 104 11/09/2022   CO2 20 11/09/2022    Lab Results  Component Value Date   CHOL 139 10/12/2021   HDL 46 10/12/2021   LDLCALC 67 10/12/2021   TRIG 149 10/12/2021   CHOLHDL 3.0 10/12/2021     Assessment/Plan:   Diabetes: - Currently uncontrolled--started ozempic, decreased insulin--> unable to tolerate ozempic so now on mounjaro -Continue metformin, glipizide (may be able to d/c) -Continue libre 2 CGM - Reviewed long term cardiovascular and renal outcomes of uncontrolled blood sugar - Reviewed goal A1c, goal fasting, and goal 2 hour post prandial glucose 15 min of patient care was provided to the patient during this visit time     Kieth Brightly, PharmD, BCACP Clinical Pharmacist, [facility]

## 2022-12-04 ENCOUNTER — Telehealth: Payer: Self-pay | Admitting: Family Medicine

## 2022-12-04 MED ORDER — TIRZEPATIDE 5 MG/0.5ML ~~LOC~~ SOAJ: 5.0000 mg | SUBCUTANEOUS | 3 refills | Status: DC

## 2022-12-04 NOTE — Addendum Note (Signed)
Addended by: Sonny Masters on: 12/04/2022 07:03 PM   Modules accepted: Orders

## 2022-12-04 NOTE — Telephone Encounter (Signed)
I am glad she was able to tolerate. Prescription was sent to pharmacy.

## 2022-12-04 NOTE — Telephone Encounter (Signed)
Patient was given samples of Mounjaro and said she is not having any issues with it at this time. Would like to have a prescription sent to [facility] for this. Please call back if there are any issues.

## 2022-12-05 NOTE — Telephone Encounter (Signed)
Pt has been notified.

## 2022-12-08 ENCOUNTER — Telehealth: Payer: Medicare Other | Admitting: Family

## 2022-12-08 ENCOUNTER — Encounter: Payer: Self-pay | Admitting: Family

## 2022-12-08 ENCOUNTER — Ambulatory Visit (INDEPENDENT_AMBULATORY_CARE_PROVIDER_SITE_OTHER): Payer: Medicare Other | Admitting: Family

## 2022-12-08 VITALS — BP 118/74 | HR 105 | Temp 97.4°F | Ht 62.0 in | Wt 107.4 lb

## 2022-12-08 DIAGNOSIS — B3731 Acute candidiasis of vulva and vagina: Secondary | ICD-10-CM | POA: Diagnosis not present

## 2022-12-08 DIAGNOSIS — N898 Other specified noninflammatory disorders of vagina: Secondary | ICD-10-CM

## 2022-12-08 DIAGNOSIS — R300 Dysuria: Secondary | ICD-10-CM

## 2022-12-08 LAB — WET PREP FOR TRICH, YEAST, CLUE
Clue Cell Exam: NEGATIVE
Trichomonas Exam: NEGATIVE
Yeast Exam: POSITIVE — AB

## 2022-12-08 LAB — URINALYSIS, COMPLETE
Bilirubin, UA: NEGATIVE
Ketones, UA: NEGATIVE
Leukocytes,UA: NEGATIVE
Nitrite, UA: NEGATIVE
Protein,UA: NEGATIVE
RBC, UA: NEGATIVE
Specific Gravity, UA: 1.01 (ref 1.005–1.030)
Urobilinogen, Ur: 2 mg/dL — ABNORMAL HIGH (ref 0.2–1.0)
pH, UA: 5 (ref 5.0–7.5)

## 2022-12-08 LAB — MICROSCOPIC EXAMINATION
Bacteria, UA: NONE SEEN
RBC, Urine: NONE SEEN /hpf (ref 0–2)
Renal Epithel, UA: NONE SEEN /hpf
Yeast, UA: NONE SEEN

## 2022-12-08 MED ORDER — FLUCONAZOLE 150 MG PO TABS
150.0000 mg | ORAL_TABLET | ORAL | 0 refills | Status: DC | PRN
Start: 2022-12-08 — End: 2022-12-28

## 2022-12-08 NOTE — Progress Notes (Signed)
Subjective:    Patient ID: Maria [name], female    DOB: 1956/01/02, 67 y.o.   MRN: [id number]  Chief Complaint  Patient presents with   vaginal irritation    Abdominal Pain   Pt presents to the office today with vaginal itching that started four days ago.  Abdominal Pain Associated symptoms include dysuria, frequency and nausea. Pertinent negatives include no hematuria or vomiting.  Vaginal Itching The patient's primary symptoms include genital itching and vaginal discharge. The patient's pertinent negatives include no genital odor. This is a new problem. The current episode started in the past 7 days. Associated symptoms include abdominal pain, dysuria, frequency and nausea. Pertinent negatives include no flank pain, hematuria, urgency or vomiting. The vaginal discharge was yellow. She has tried antifungals for the symptoms. The treatment provided mild relief.  Dysuria  This is a new problem. The current episode started in the past 7 days. The problem has been waxing and waning. The quality of the pain is described as burning. The pain is at a severity of 8/10. The pain is mild. Associated symptoms include frequency and nausea. Pertinent negatives include no flank pain, hematuria, hesitancy, urgency or vomiting. She has tried increased fluids for the symptoms. The treatment provided mild relief.      Review of Systems  Gastrointestinal:  Positive for abdominal pain and nausea. Negative for vomiting.  Genitourinary:  Positive for dysuria, frequency and vaginal discharge. Negative for flank pain, hematuria, hesitancy and urgency.  All other systems reviewed and are negative.      Objective:   Physical Exam Vitals reviewed.  Constitutional:      General: She is not in acute distress.    Appearance: She is well-developed.  HENT:     Head: Normocephalic and atraumatic.     Right Ear: External ear normal.  Eyes:     Pupils: Pupils are equal, round, and reactive to light.  Neck:      Thyroid: No thyromegaly.  Cardiovascular:     Rate and Rhythm: Normal rate and regular rhythm.     Heart sounds: Normal heart sounds. No murmur heard. Pulmonary:     Effort: Pulmonary effort is normal. No respiratory distress.     Breath sounds: Normal breath sounds. No wheezing.  Abdominal:     General: Bowel sounds are normal. There is no distension.     Palpations: Abdomen is soft.     Tenderness: There is no abdominal tenderness.  Musculoskeletal:        General: No tenderness. Normal range of motion.     Cervical back: Normal range of motion and neck supple.  Skin:    General: Skin is warm and dry.  Neurological:     Mental Status: She is alert and oriented to person, place, and time.     Cranial Nerves: No cranial nerve deficit.     Deep Tendon Reflexes: Reflexes are normal and symmetric.  Psychiatric:        Behavior: Behavior normal.        Thought Content: Thought content normal.        Judgment: Judgment normal.     BP 118/74   Pulse (!) 105   Temp (!) 97.4 F (36.3 C) (Temporal)   Ht 5\' 2"  (1.575 m)   Wt 107 lb 6.4 oz (48.7 kg)   SpO2 95%   BMI 19.64 kg/m        Assessment & Plan:  Maria [name] comes in today with  chief complaint of vaginal irritation  and Abdominal Pain   Diagnosis and orders addressed:  1. Vaginal irritation - WET PREP FOR TRICH, YEAST, CLUE  2. Dysuria - Urinalysis, Complete - Urine Culture  3. Vagina, candidiasis Start diflucan  Keep clean and dry Urine negative for infection  Follow up as needed if symptoms worsen or do not improve  - fluconazole (DIFLUCAN) 150 MG tablet; Take 1 tablet (150 mg total) by mouth every three (3) days as needed.  Dispense: 3 tablet; Refill: 0     Jannifer Rodney, FNP

## 2022-12-08 NOTE — Patient Instructions (Signed)

## 2022-12-10 LAB — URINE CULTURE: Organism ID, Bacteria: NO GROWTH

## 2022-12-15 ENCOUNTER — Emergency Department (HOSPITAL_COMMUNITY)
Admission: EM | Admit: 2022-12-15 | Discharge: 2022-12-15 | Disposition: A | Discharge status: Home / Self Care | Payer: Medicare Other | Attending: Emergency Medicine | Admitting: Emergency Medicine

## 2022-12-15 ENCOUNTER — Encounter (HOSPITAL_COMMUNITY): Payer: Self-pay | Admitting: *Deleted

## 2022-12-15 ENCOUNTER — Other Ambulatory Visit: Payer: Self-pay

## 2022-12-15 ENCOUNTER — Emergency Department (HOSPITAL_COMMUNITY): Payer: Medicare Other

## 2022-12-15 DIAGNOSIS — J45909 Unspecified asthma, uncomplicated: Secondary | ICD-10-CM | POA: Insufficient documentation

## 2022-12-15 DIAGNOSIS — K529 Noninfective gastroenteritis and colitis, unspecified: Secondary | ICD-10-CM | POA: Insufficient documentation

## 2022-12-15 DIAGNOSIS — Z7984 Long term (current) use of oral hypoglycemic drugs: Secondary | ICD-10-CM | POA: Insufficient documentation

## 2022-12-15 DIAGNOSIS — Z79899 Other long term (current) drug therapy: Secondary | ICD-10-CM | POA: Insufficient documentation

## 2022-12-15 DIAGNOSIS — D72829 Elevated white blood cell count, unspecified: Secondary | ICD-10-CM | POA: Diagnosis not present

## 2022-12-15 DIAGNOSIS — I10 Essential (primary) hypertension: Secondary | ICD-10-CM | POA: Diagnosis not present

## 2022-12-15 DIAGNOSIS — E1140 Type 2 diabetes mellitus with diabetic neuropathy, unspecified: Secondary | ICD-10-CM | POA: Insufficient documentation

## 2022-12-15 DIAGNOSIS — R112 Nausea with vomiting, unspecified: Secondary | ICD-10-CM

## 2022-12-15 DIAGNOSIS — Z8541 Personal history of malignant neoplasm of cervix uteri: Secondary | ICD-10-CM | POA: Diagnosis not present

## 2022-12-15 DIAGNOSIS — E039 Hypothyroidism, unspecified: Secondary | ICD-10-CM | POA: Insufficient documentation

## 2022-12-15 DIAGNOSIS — R1030 Lower abdominal pain, unspecified: Secondary | ICD-10-CM | POA: Diagnosis present

## 2022-12-15 DIAGNOSIS — E1139 Type 2 diabetes mellitus with other diabetic ophthalmic complication: Secondary | ICD-10-CM | POA: Insufficient documentation

## 2022-12-15 DIAGNOSIS — R109 Unspecified abdominal pain: Secondary | ICD-10-CM | POA: Diagnosis not present

## 2022-12-15 LAB — COMPREHENSIVE METABOLIC PANEL
ALT: 23 U/L (ref 0–44)
AST: 22 U/L (ref 15–41)
Albumin: 4.4 g/dL (ref 3.5–5.0)
Alkaline Phosphatase: 55 U/L (ref 38–126)
Anion gap: 11 (ref 5–15)
BUN: 14 mg/dL (ref 8–23)
CO2: 23 mmol/L (ref 22–32)
Calcium: 8.8 mg/dL — ABNORMAL LOW (ref 8.9–10.3)
Chloride: 104 mmol/L (ref 98–111)
Creatinine, Ser: 0.74 mg/dL (ref 0.44–1.00)
GFR, Estimated: >60 mL/min (ref >60)
Glucose, Bld: 183 mg/dL — ABNORMAL HIGH (ref 70–99)
Potassium: 3.8 mmol/L (ref 3.5–5.1)
Sodium: 138 mmol/L (ref 135–145)
Total Bilirubin: 1.8 mg/dL — ABNORMAL HIGH (ref 0.3–1.2)
Total Protein: 7 g/dL (ref 6.5–8.1)

## 2022-12-15 LAB — LIPASE, BLOOD: Lipase: 28 U/L (ref 11–51)

## 2022-12-15 LAB — URINALYSIS, ROUTINE W REFLEX MICROSCOPIC
Bilirubin Urine: NEGATIVE
Glucose, UA: >=500 mg/dL — AB
Hgb urine dipstick: NEGATIVE
Ketones, ur: 20 mg/dL — AB
Leukocytes,Ua: NEGATIVE
Nitrite: NEGATIVE
Protein, ur: NEGATIVE mg/dL
Specific Gravity, Urine: 1.032 — ABNORMAL HIGH (ref 1.005–1.030)
pH: 5 (ref 5.0–8.0)

## 2022-12-15 LAB — CBC
HCT: 45.4 % (ref 36.0–46.0)
Hemoglobin: 15.3 g/dL — ABNORMAL HIGH (ref 12.0–15.0)
MCH: 30.4 pg (ref 26.0–34.0)
MCHC: 33.7 g/dL (ref 30.0–36.0)
MCV: 90.3 fL (ref 80.0–100.0)
Platelets: 228 10*3/uL (ref 150–400)
RBC: 5.03 MIL/uL (ref 3.87–5.11)
RDW: 13 % (ref 11.5–15.5)
WBC: 12.1 10*3/uL — ABNORMAL HIGH (ref 4.0–10.5)
nRBC: 0 % (ref 0.0–0.2)

## 2022-12-15 MED ORDER — LOPERAMIDE HCL 2 MG PO TABS: 2.0000 mg | ORAL_TABLET | Freq: Four times a day (QID) | ORAL | 0 refills | Status: AC | PRN

## 2022-12-15 MED ORDER — ONDANSETRON HCL 4 MG/2ML IJ SOLN
4.0000 mg | Freq: Once | INTRAMUSCULAR | Status: AC
  Administered 2022-12-15: 4 mg via INTRAVENOUS
  Filled 2022-12-15: qty 2

## 2022-12-15 MED ORDER — SODIUM CHLORIDE 0.9 % IV SOLN: INTRAVENOUS | Status: DC

## 2022-12-15 MED ORDER — ONDANSETRON 4 MG PO TBDP: 4.0000 mg | ORAL_TABLET | Freq: Three times a day (TID) | ORAL | 0 refills | Status: DC | PRN

## 2022-12-15 MED ORDER — IOHEXOL 300 MG/ML  SOLN
75.0000 mL | Freq: Once | INTRAMUSCULAR | Status: AC | PRN
  Administered 2022-12-15: 75 mL via INTRAVENOUS

## 2022-12-15 MED ORDER — SODIUM CHLORIDE 0.9 % IV BOLUS
500.0000 mL | Freq: Once | INTRAVENOUS | Status: AC
  Administered 2022-12-15: 500 mL via INTRAVENOUS

## 2022-12-15 NOTE — Discharge Instructions (Addendum)
Get appointment follow back up with your primary care doctor.  CT scan here today should show some evidence of colitis.  But since symptoms just started today and there is no fever.  Will hold off on treating with antibiotics for now but if symptoms persist that may be appropriate.  This very well could be due to a virus.  Take the Imodium A-D as needed for the diarrhea.  And take the Zofran ODT as needed for nausea or vomiting.  Would recommend just clear liquids small amounts frequently at least for the next 24 hours and then advance to bland diet.  Return for any new or worse symptoms.

## 2022-12-15 NOTE — ED Triage Notes (Signed)
Pt with abd pain this morning. Pt with recent yeast infection and thinks she may have UTI. + N/V/D

## 2022-12-15 NOTE — ED Provider Notes (Addendum)
[name] Provider Note   CSN: [id number] Arrival date & time: 12/15/22  1437     History  Chief Complaint  Patient presents with   Abdominal Pain    [name] is a 67 y.o. female.  Patient was seen by primary care doctor on the 20th and had urinalysis done urine sent for culture which was negative.  Did have a wet prep that was positive for yeast.  Patient treated with Diflucan 150 mg every third day.  Patient did accidentally take that 2 days in a row but did not get sick at that time.  She woke this morning at around 9:00 with nausea vomiting and diarrhea.  Still very nauseated crampy abdominal pain mostly lower part of the abdomen.  No fevers.  No known sick contacts.  Patient denies any blood in the vomit or the diarrhea.  Past medical history sniffing for diabetes hypothyroidism hyperlipidemia asthma neuropathy glaucoma gastroesophageal reflux disease history of cervical cancer hypertension history of kidney stones.  Past surgical history sniffing for abdominal hysterectomy cardiac catheterization in 2006 that was negative.  Left shoulder surgery in 2011.  Appendectomy open reduction for left ankle fracture in 2020.  Patient is a former smoker quit in 2003.       Home Medications Prior to Admission medications   Medication Sig Start Date End Date Taking? Authorizing Provider  loperamide (IMODIUM A-D) 2 MG tablet Take 1 tablet (2 mg total) by mouth 4 (four) times daily as needed for diarrhea or loose stools. 12/15/22  Yes Vanetta Mulders, MD  ondansetron (ZOFRAN-ODT) 4 MG disintegrating tablet Take 1 tablet (4 mg total) by mouth every 8 (eight) hours as needed for nausea or vomiting. 12/15/22  Yes Vanetta Mulders, MD  acetaminophen (TYLENOL) 500 MG tablet Take 1,000 mg by mouth every 6 (six) hours as needed for moderate pain.    [provider]  albuterol (VENTOLIN HFA) 108 (90 Base) MCG/ACT inhaler Inhale 2 puffs into  the lungs every 6 (six) hours as needed for wheezi ng orshortness of breath. 07/21/19   Merlyn Albert, MD  bimatoprost (LUMIGAN) 0.01 % SOLN Place 1 drop into both eyes at bedtime.    [provider]  calcium carbonate (OSCAL) 1500 (600 Ca) MG TABS tablet Take by mouth 2 (two) times daily with a meal.    [provider]  cetirizine (ZYRTEC) 10 MG tablet 1 tablet Orally Once a day for 30 day(s) 03/17/20   [provider]  Cholecalciferol (VITAMIN D3) 50 MCG (2000 UT) capsule 1 tablet    [provider]  Continuous Blood Gluc Receiver (FREESTYLE LIBRE 2 READER) DEVI Use to test blood sugar continuously. DX: E11.65 04/01/21   Sonny Masters, FNP  Continuous Blood Gluc Sensor (FREESTYLE LIBRE 2 SENSOR) MISC USE TO TEST BLOOD SUGAR CONTINUOUSLY 03/21/22   Sonny Masters, FNP  denosumab (PROLIA) 60 MG/ML SOSY injection Inject 60 mg into the skin every 6 (six) months. 08/29/22   Sonny Masters, FNP  FARXIGA 10 MG TABS tablet TAKE ONE TABLET DAILY BEFORE BREAKFAST 10/03/22   Sonny Masters, FNP  fluconazole (DIFLUCAN) 150 MG tablet Take 1 tablet (150 mg total) by mouth every three (3) days as needed. 12/08/22   Junie Spencer, FNP  glipiZIDE (GLUCOTROL XL) 10 MG 24 hr tablet Take 2 tablets (20 mg total) by mouth daily with breakfast. 11/09/22 02/07/23  Sonny Masters, FNP  levothyroxine (SYNTHROID) 75 MCG  tablet TAKE ONE TABLET BY MOUTH DAILY 04/19/22   Sonny Masters, FNP  lisinopril (ZESTRIL) 5 MG tablet TAKE ONE TABLET DAILY FOR HIGH BLOOD PRESSURE 10/03/22   Rakes, Doralee Albino, FNP  LUMIGAN 0.01 % SOLN Place 1 drop into both eyes at bedtime.  06/16/16   [provider]  metFORMIN (GLUCOPHAGE) 1000 MG tablet TAKE ONE TABLET TWICE DAILY WITH MEAL(S) 10/03/22   Sonny Masters, FNP  montelukast (SINGULAIR) 10 MG tablet TAKE ONE TABLET ONCE DAILY 10/03/22   Sonny Masters, FNP  ondansetron (ZOFRAN) 4 MG tablet Take 1 tablet (4 mg total) by mouth every 8 (eight) hours as needed  for nausea or vomiting. 05/04/22   Sonny Masters, FNP  pantoprazole (PROTONIX) 40 MG tablet TAKE ONE TABLET ONCE DAILY 10/03/22   Sonny Masters, FNP  phenylephrine (SUDAFED PE) 10 MG TABS tablet Take 10 mg by mouth every 4 (four) hours as needed (sinus congestion).    [provider]  PROLIA 60 MG/ML SOSY injection Inject 60 mg into the skin every 6 (six) months. 02/23/22   Sonny Masters, FNP  rosuvastatin (CRESTOR) 20 MG tablet Take 1 tablet (20 mg total) by mouth daily. 11/09/22   Sonny Masters, FNP  tirzepatide [facility]) 5 MG/0.5ML Pen Inject 5 mg into the skin once a week. 12/04/22   Sonny Masters, FNP  TRELEGY ELLIPTA 200-62.5-25 MCG/ACT AEPB INHALE 1 PUFF ONCE DAILY 03/02/22   Sonny Masters, FNP  triamcinolone cream (KENALOG) 0.1 % Apply 1 application topically 2 (two) times daily as needed (dry skin). 08/21/19   Laroy Apple M, DO  vitamin B-12 (CYANOCOBALAMIN) 1000 MCG tablet Take 1,000 mcg by mouth daily.    [provider]      Allergies    Ozempic (0.25 or 0.5 mg-dose) [semaglutide(0.25 or 0.5mg -dos)]    Review of Systems   Review of Systems  Constitutional:  Negative for chills and fever.  HENT:  Negative for ear pain and sore throat.   Eyes:  Negative for pain and visual disturbance.  Respiratory:  Negative for cough and shortness of breath.   Cardiovascular:  Negative for chest pain and palpitations.  Gastrointestinal:  Positive for abdominal pain, diarrhea, nausea and vomiting.  Genitourinary:  Negative for dysuria and hematuria.  Musculoskeletal:  Negative for arthralgias and back pain.  Skin:  Negative for color change and rash.  Neurological:  Negative for seizures and syncope.  All other systems reviewed and are negative.   Physical Exam Updated Vital Signs BP 101/68 (BP Location: Right Arm)   Pulse 85   Temp 98.4 F (36.9 C) (Oral)   Resp 16   Ht 1.575 m (5\' 2" )   Wt 48.5 kg   SpO2 96%   BMI 19.57 kg/m  Physical Exam Vitals and nursing  note reviewed.  Constitutional:      General: She is not in acute distress.    Appearance: Normal appearance. She is well-developed.  HENT:     Head: Normocephalic and atraumatic.  Eyes:     Extraocular Movements: Extraocular movements intact.     Conjunctiva/sclera: Conjunctivae normal.     Pupils: Pupils are equal, round, and reactive to light.  Cardiovascular:     Rate and Rhythm: Normal rate and regular rhythm.     Heart sounds: No murmur heard. Pulmonary:     Effort: Pulmonary effort is normal. No respiratory distress.     Breath sounds: Normal breath sounds.  Abdominal:  General: There is no distension.     Palpations: Abdomen is soft.     Tenderness: There is no abdominal tenderness. There is no guarding.  Musculoskeletal:        General: No swelling.     Cervical back: Normal range of motion and neck supple.  Skin:    General: Skin is warm and dry.     Capillary Refill: Capillary refill takes less than 2 seconds.  Neurological:     General: No focal deficit present.     Mental Status: She is alert and oriented to person, place, and time.  Psychiatric:        Mood and Affect: Mood normal.     ED Results / Procedures / Treatments   Labs (all labs ordered are listed, but only abnormal results are displayed) Labs Reviewed  COMPREHENSIVE METABOLIC PANEL - Abnormal; Notable for the following components:      Result Value   Glucose, Bld 183 (*)    Calcium 8.8 (*)    Total Bilirubin 1.8 (*)    All other components within normal limits  CBC - Abnormal; Notable for the following components:   WBC 12.1 (*)    Hemoglobin 15.3 (*)    All other components within normal limits  URINALYSIS, ROUTINE W REFLEX MICROSCOPIC - Abnormal; Notable for the following components:   Specific Gravity, Urine 1.032 (*)    Glucose, UA >=500 (*)    Ketones, ur 20 (*)    All other components within normal limits  LIPASE, BLOOD    EKG None  Radiology CT ABDOMEN PELVIS W  CONTRAST  Result Date: 12/15/2022 CLINICAL DATA:  Abdominal pain.  Acute nonlocalized. EXAM: CT ABDOMEN AND PELVIS WITH CONTRAST TECHNIQUE: Multidetector CT imaging of the abdomen and pelvis was performed using the standard protocol following bolus administration of intravenous contrast. RADIATION DOSE REDUCTION: This exam was performed according to the departmental dose-optimization program which includes automated exposure control, adjustment of the mA and/or kV according to patient size and/or use of iterative reconstruction technique. CONTRAST:  75mL OMNIPAQUE IOHEXOL 300 MG/ML  SOLN COMPARISON:  None Available. FINDINGS: Lower chest: Lung bases are clear. Hepatobiliary: No focal hepatic lesion. Normal gallbladder. No biliary duct dilatation. Common bile duct is normal. Pancreas: Pancreas is normal. No ductal dilatation. No pancreatic inflammation. Spleen: Stomach, small bowel normal.  No biliary duct dilatation Adrenals/urinary tract: Adrenal glands and kidneys are normal. The ureters and bladder normal. Stomach/Bowel: Stomach, duodenum small bowel are normal. No biliary duct dilatation. Appendix not identified. No secondary signs appendicitis. Ascending colon is normal. There is submucosal thickening of the transverse colon and descending colon. Rectosigmoid colon relatively normal. No diverticulosis. Vascular/Lymphatic: Abdominal aorta is normal caliber with atherosclerotic calcification. There is no retroperitoneal or periportal lymphadenopathy. No pelvic lymphadenopathy. Reproductive: Post hysterectomy.  Adnexa unremarkable Other: No free fluid. Musculoskeletal: No aggressive osseous lesion. IMPRESSION: 1. Submucosal thickening of the transverse colon and descending suggest segmental colitis. Differential include inflammatory bowel disease, infectious colitis, drug induced colitis, or ischemic colitis. 2. Celiac trunk and SMA are widely patent. 3. No evidence urinary tract infection. Electronically Signed    By: Genevive Bi M.D.   On: 12/15/2022 18:48    Procedures Procedures    Medications Ordered in ED Medications  0.9 %  sodium chloride infusion (has no administration in time range)  sodium chloride 0.9 % bolus 500 mL (0 mLs Intravenous Stopped 12/15/22 1737)  ondansetron (ZOFRAN) injection 4 mg (4 mg Intravenous Given 12/15/22 1708)  iohexol (OMNIPAQUE) 300 MG/ML solution 75 mL (75 mLs Intravenous Contrast Given 12/15/22 1730)    ED Course/ Medical Decision Making/ A&P                                 Medical Decision Making Amount and/or Complexity of Data Reviewed Labs: ordered. Radiology: ordered.  Risk OTC drugs. Prescription drug management.   He had onset of lower quadrant abdominal pain associate with nausea vomiting and diarrhea.  Give patient IV fluids here including 500 cc bolus.  Will give Zofran for the nausea.  Urinalysis here negative.  Send no signs urinary tract infection plus patient's urine culture from September 20 had no growth.  Lipase 28 complete metabolic panel total bili up at 1.8 glucose here 183 known to have diabetes electrolytes are normal.  Renal function GFR is greater than 60 CBC white count 12,000 hemoglobin 15.3 platelets 228.  CT shows evidence of some colitis.  Patient's white blood cell count a little elevated at 12.  Patient not febrile.  Onset of symptoms just today.  Think of treat this as a viral gastroenteritis if it persists then consideration for treatment with antibiotics.  Patient can follow-up with her primary care doctor.  Will treat her with Imodium and Zofran ODT.  Final Clinical Impression(s) / ED Diagnoses Final diagnoses:  Lower abdominal pain  Nausea, vomiting and diarrhea  Colitis    Rx / DC Orders ED Discharge Orders          Ordered    loperamide (IMODIUM A-D) 2 MG tablet  4 times daily PRN        12/15/22 1908    ondansetron (ZOFRAN-ODT) 4 MG disintegrating tablet  Every 8 hours PRN        12/15/22 1908               Vanetta Mulders, MD 12/15/22 1649    Vanetta Mulders, MD 12/15/22 1918

## 2022-12-27 ENCOUNTER — Other Ambulatory Visit: Payer: Self-pay

## 2022-12-27 DIAGNOSIS — E1169 Type 2 diabetes mellitus with other specified complication: Secondary | ICD-10-CM

## 2022-12-28 ENCOUNTER — Encounter: Payer: Self-pay | Admitting: Family Medicine

## 2022-12-28 ENCOUNTER — Ambulatory Visit (INDEPENDENT_AMBULATORY_CARE_PROVIDER_SITE_OTHER): Payer: Medicare Other | Admitting: Family Medicine

## 2022-12-28 ENCOUNTER — Ambulatory Visit (HOSPITAL_COMMUNITY)
Admission: RE | Admit: 2022-12-28 | Discharge: 2022-12-28 | Disposition: A | Discharge status: Home / Self Care | Payer: Medicare Other | Source: Ambulatory Visit | Attending: Family Medicine | Admitting: Family Medicine

## 2022-12-28 VITALS — BP 115/72 | HR 91 | Temp 97.2°F | Ht 62.0 in | Wt 106.2 lb

## 2022-12-28 DIAGNOSIS — K5909 Other constipation: Secondary | ICD-10-CM | POA: Diagnosis not present

## 2022-12-28 DIAGNOSIS — R1084 Generalized abdominal pain: Secondary | ICD-10-CM | POA: Insufficient documentation

## 2022-12-28 DIAGNOSIS — B3731 Acute candidiasis of vulva and vagina: Secondary | ICD-10-CM

## 2022-12-28 DIAGNOSIS — K529 Noninfective gastroenteritis and colitis, unspecified: Secondary | ICD-10-CM | POA: Diagnosis not present

## 2022-12-28 DIAGNOSIS — K449 Diaphragmatic hernia without obstruction or gangrene: Secondary | ICD-10-CM | POA: Diagnosis not present

## 2022-12-28 DIAGNOSIS — R112 Nausea with vomiting, unspecified: Secondary | ICD-10-CM

## 2022-12-28 DIAGNOSIS — K5900 Constipation, unspecified: Secondary | ICD-10-CM | POA: Diagnosis not present

## 2022-12-28 LAB — WET PREP FOR TRICH, YEAST, CLUE
Clue Cell Exam: NEGATIVE
Trichomonas Exam: NEGATIVE
Yeast Exam: NEGATIVE

## 2022-12-28 MED ORDER — POLYETHYLENE GLYCOL 3350 17 GM/SCOOP PO POWD
17.0000 g | Freq: Every day | ORAL | 1 refills | Status: AC
Start: 2022-12-30 — End: ?

## 2022-12-28 MED ORDER — IOHEXOL 300 MG/ML  SOLN
100.0000 mL | Freq: Once | INTRAMUSCULAR | Status: AC | PRN
  Administered 2022-12-28: 80 mL via INTRAVENOUS

## 2022-12-28 MED ORDER — MAGNESIUM CITRATE PO SOLN
1.0000 | Freq: Once | ORAL | 0 refills | Status: AC
Start: 2022-12-28 — End: 2022-12-28

## 2022-12-28 NOTE — Addendum Note (Signed)
Addended by: Sonny Masters on: 12/28/2022 04:07 PM   Modules accepted: Orders

## 2022-12-28 NOTE — Progress Notes (Signed)
Subjective:  Patient ID: Maria [name], female    DOB: 1955/08/25, 67 y.o.   MRN: [id number]  Patient Care Team: Sonny Masters, FNP as PCP - General (Family Medicine) Danella Maiers, [facility] as Pharmacist (Family Medicine)   Chief Complaint:  Vaginitis (X 1 day ) and ER follow up  (12/15/2022 (4 hours)/[place] [facility] at [facility]- abd pain. Patient states she is still having on and off abd pain. )   HPI: Maria [name] is a 67 y.o. female presenting on 12/28/2022 for Vaginitis (X 1 day ) and ER follow up  (12/15/2022 (4 hours)/[place] [facility] at [facility]- abd pain. Patient states she is still having on and off abd pain. )   Discussed the use of AI scribe software for clinical note transcription with the patient, who gave verbal consent to proceed.  History of Present Illness   The patient presents with a recurrent yeast infection, the second episode since September. She also reports severe lower abdominal cramping that began on a Saturday following her last visit. The pain was so intense that it rendered her immobile and was accompanied by diarrhea and vomiting. Due to the severity of the symptoms, she sought emergency care where a CT scan was performed. The scan revealed inflammation and colitis in the large intestine.  While the pain has somewhat subsided, it still occurs in spells and is severe enough to induce nausea. The patient denies any blood in urine or diarrhea and has not experienced any fevers or chills. She has been managing the symptoms with Zofran and Imodium, taken only when necessary, about three to four times since the onset of symptoms.  The patient reports a change in bowel habits, with the last bowel movement occurring on the previous Saturday, suggesting possible constipation. She also reports episodes of vomiting and significant nausea, which have since subsided. However, the nausea is severe enough to require her  to lie down until it passes.  The patient also reports persistent itching and burning, which she initially attributed to a yeast infection.        Relevant past medical, surgical, family, and social history reviewed and updated as indicated.  Allergies and medications reviewed and updated. Data reviewed: Chart in Epic.   Past Medical History:  Diagnosis Date   Allergy    Asthma    Cancer (HCC)    cervical   Complication of anesthesia    " I woke and was short of breath."   CTS (carpal tunnel syndrome)    Diabetes mellitus without complication (HCC)    Early cataracts, bilateral    Fracture    trimalleolar ankle left   GERD (gastroesophageal reflux disease)    Glaucoma    Glaucoma    History of kidney stones    " I passed it"   Hyperlipidemia    Hypertension    Hypothyroidism    Neuropathy    Vitamin D deficiency    Wears glasses     Past Surgical History:  Procedure Laterality Date   ABDOMINAL HYSTERECTOMY     APPENDECTOMY     CARDIAC CATHETERIZATION  2006   negative   COLONOSCOPY  2009   negative   INNER EAR SURGERY Right 1981   left shoulder surgery Left 10/2009   ORIF ANKLE FRACTURE Left 09/05/2018   Procedure: OPEN REDUCTION INTERNAL FIXATION TRIMALLEOLAR FRACTURE POSSIBLE SYNDEMOSIS;  Surgeon: Terance Hart, MD;  Location: [facility] OR;  Service: Orthopedics;  Laterality: Left;   TONSILLECTOMY     TUBAL LIGATION      Social History   Socioeconomic History   Marital status: Married    Spouse name: Loraine Leriche   Number of children: 2   Years of education: Not on file   Highest education level: Not on file  Occupational History   Not on file  Tobacco Use   Smoking status: Former    Current packs/day: 0.00    Average packs/day: 1 [name]/day for 15.0 years (15.0 ttl pk-yrs)    Types: Cigarettes    Start date: 06/06/1986    Quit date: 06/05/2001    Years since quitting: 21.5   Smokeless tobacco: Never  Vaping Use   Vaping status: Never Used  Substance and  Sexual Activity   Alcohol use: No   Drug use: No   Sexual activity: Not Currently  Other Topics Concern   Not on file  Social History Narrative   2 sons.   3 grandchildren.   Social Determinants of Health   Financial Resource Strain: Low Risk  (02/20/2022)   Overall Financial Resource Strain (CARDIA)    Difficulty of Paying Living Expenses: Not hard at all  Food Insecurity: No Food Insecurity (02/20/2022)   Hunger Vital Sign    Worried About Running Out of Food in the Last Year: Never true    Ran Out of Food in the Last Year: Never true  Transportation Needs: No Transportation Needs (02/20/2022)   PRAPARE - Administrator, Civil Service (Medical): No    Lack of Transportation (Non-Medical): No  Physical Activity: Insufficiently Active (02/20/2022)   Exercise Vital Sign    Days of Exercise per Week: 3 days    Minutes of Exercise per Session: 30 min  Stress: No Stress Concern Present (02/20/2022)   [employer] of Occupational Health - Occupational Stress Questionnaire    Feeling of Stress : Not at all  Social Connections: Moderately Integrated (02/20/2022)   Social Connection and Isolation Panel [NHANES]    Frequency of Communication with Friends and Family: More than three times a week    Frequency of Social Gatherings with Friends and Family: More than three times a week    Attends Religious Services: 1 to 4 times per year    Active Member of [employer] or Organizations: No    Attends Banker Meetings: Never    Marital Status: Married  Catering manager Violence: Not At Risk (02/20/2022)   Humiliation, Afraid, Rape, and Kick questionnaire    Fear of Current or Ex-Partner: No    Emotionally Abused: No    Physically Abused: No    Sexually Abused: No    Outpatient Encounter Medications as of 12/28/2022  Medication Sig   acetaminophen (TYLENOL) 500 MG tablet Take 1,000 mg by mouth every 6 (six) hours as needed for moderate pain.   albuterol (VENTOLIN HFA)  108 (90 Base) MCG/ACT inhaler Inhale 2 puffs into the lungs every 6 (six) hours as needed for wheezi ng orshortness of breath.   bimatoprost (LUMIGAN) 0.01 % SOLN Place 1 drop into both eyes at bedtime.   calcium carbonate (OSCAL) 1500 (600 Ca) MG TABS tablet Take by mouth 2 (two) times daily with a meal.   cetirizine (ZYRTEC) 10 MG tablet 1 tablet Orally Once a day for 30 day(s)   Cholecalciferol (VITAMIN D3) 50 MCG (2000 UT) capsule 1 tablet   Continuous Blood Gluc Receiver (FREESTYLE LIBRE 2 READER) DEVI Use  to test blood sugar continuously. DX: E11.65   Continuous Blood Gluc Sensor (FREESTYLE LIBRE 2 SENSOR) MISC USE TO TEST BLOOD SUGAR CONTINUOUSLY   denosumab (PROLIA) 60 MG/ML SOSY injection Inject 60 mg into the skin every 6 (six) months.   FARXIGA 10 MG TABS tablet TAKE ONE TABLET DAILY BEFORE BREAKFAST   glipiZIDE (GLUCOTROL XL) 10 MG 24 hr tablet Take 2 tablets (20 mg total) by mouth daily with breakfast.   levothyroxine (SYNTHROID) 75 MCG tablet TAKE ONE TABLET BY MOUTH DAILY   lisinopril (ZESTRIL) 5 MG tablet TAKE ONE TABLET DAILY FOR HIGH BLOOD PRESSURE   loperamide (IMODIUM A-D) 2 MG tablet Take 1 tablet (2 mg total) by mouth 4 (four) times daily as needed for diarrhea or loose stools.   LUMIGAN 0.01 % SOLN Place 1 drop into both eyes at bedtime.    metFORMIN (GLUCOPHAGE) 1000 MG tablet TAKE ONE TABLET TWICE DAILY WITH MEAL(S)   montelukast (SINGULAIR) 10 MG tablet TAKE ONE TABLET ONCE DAILY   ondansetron (ZOFRAN) 4 MG tablet Take 1 tablet (4 mg total) by mouth every 8 (eight) hours as needed for nausea or vomiting.   ondansetron (ZOFRAN-ODT) 4 MG disintegrating tablet Take 1 tablet (4 mg total) by mouth every 8 (eight) hours as needed for nausea or vomiting.   pantoprazole (PROTONIX) 40 MG tablet TAKE ONE TABLET ONCE DAILY   phenylephrine (SUDAFED PE) 10 MG TABS tablet Take 10 mg by mouth every 4 (four) hours as needed (sinus congestion).   PROLIA 60 MG/ML SOSY injection Inject 60  mg into the skin every 6 (six) months.   rosuvastatin (CRESTOR) 20 MG tablet Take 1 tablet (20 mg total) by mouth daily.   tirzepatide [facility]) 5 MG/0.5ML Pen Inject 5 mg into the skin once a week.   TRELEGY ELLIPTA 200-62.5-25 MCG/ACT AEPB INHALE 1 PUFF ONCE DAILY   triamcinolone cream (KENALOG) 0.1 % Apply 1 application topically 2 (two) times daily as needed (dry skin).   vitamin B-12 (CYANOCOBALAMIN) 1000 MCG tablet Take 1,000 mcg by mouth daily.   [DISCONTINUED] fluconazole (DIFLUCAN) 150 MG tablet Take 1 tablet (150 mg total) by mouth every three (3) days as needed.   No facility-administered encounter medications on file as of 12/28/2022.    Allergies  Allergen Reactions   Ozempic (0.25 Or 0.5 Mg-Dose) [Semaglutide(0.25 Or 0.5mg -Dos)] Nausea And Vomiting    N/v, no energy    Review of Systems  Constitutional:  Positive for appetite change. Negative for activity change, chills, diaphoresis, fatigue, fever and unexpected weight change.  HENT: Negative.    Eyes: Negative.  Negative for photophobia and visual disturbance.  Respiratory:  Negative for cough, chest tightness and shortness of breath.   Cardiovascular:  Negative for chest pain, palpitations and leg swelling.  Gastrointestinal:  Positive for abdominal pain, constipation, diarrhea, nausea and vomiting. Negative for abdominal distention, anal bleeding, blood in stool and rectal pain.  Endocrine: Negative.  Negative for polydipsia, polyphagia and polyuria.  Genitourinary:  Negative for decreased urine volume, difficulty urinating, dysuria, frequency, hematuria and urgency.       Vaginal itching  Musculoskeletal:  Negative for arthralgias and myalgias.  Skin: Negative.   Allergic/Immunologic: Negative.   Neurological:  Negative for dizziness, tremors, seizures, syncope, facial asymmetry, speech difficulty, weakness, light-headedness, numbness and headaches.  Hematological: Negative.   Psychiatric/Behavioral:  Negative for  confusion, hallucinations, sleep disturbance and suicidal ideas.   All other systems reviewed and are negative.       Objective:  BP 115/72   Pulse 91   Temp (!) 97.2 F (36.2 C) (Temporal)   Ht 5\' 2"  (1.575 m)   Wt 106 lb 3.2 oz (48.2 kg)   SpO2 97%   BMI 19.42 kg/m    Wt Readings from Last 3 Encounters:  12/28/22 106 lb 3.2 oz (48.2 kg)  12/15/22 107 lb (48.5 kg)  12/08/22 107 lb 6.4 oz (48.7 kg)    Physical Exam Vitals and nursing note reviewed.  Constitutional:      General: She is not in acute distress.    Appearance: Normal appearance. She is well-developed, well-groomed and normal weight. She is not ill-appearing, toxic-appearing or diaphoretic.  HENT:     Head: Normocephalic and atraumatic.     Jaw: There is normal jaw occlusion.     Right Ear: Hearing normal.     Left Ear: Hearing normal.     Nose: Nose normal.     Mouth/Throat:     Lips: Pink.     Mouth: Mucous membranes are moist.     Pharynx: Oropharynx is clear. Uvula midline.  Eyes:     General: Lids are normal.     Extraocular Movements: Extraocular movements intact.     Conjunctiva/sclera: Conjunctivae normal.     Pupils: Pupils are equal, round, and reactive to light.  Neck:     Thyroid: No thyroid mass, thyromegaly or thyroid tenderness.     Vascular: No carotid bruit or JVD.     Trachea: Trachea and phonation normal.  Cardiovascular:     Rate and Rhythm: Normal rate and regular rhythm.     Chest Wall: PMI is not displaced.     Pulses: Normal pulses.     Heart sounds: Normal heart sounds. No murmur heard.    No friction rub. No gallop.  Pulmonary:     Effort: Pulmonary effort is normal. No respiratory distress.     Breath sounds: Normal breath sounds. No wheezing.  Abdominal:     General: Bowel sounds are normal. There is no distension or abdominal bruit.     Palpations: Abdomen is soft. There is no hepatomegaly or splenomegaly.     Tenderness: There is abdominal tenderness in the right  lower quadrant, periumbilical area, suprapubic area and left lower quadrant. There is no right CVA tenderness or left CVA tenderness.     Hernia: No hernia is present.  Musculoskeletal:        General: Normal range of motion.     Cervical back: Normal range of motion and neck supple.     Right lower leg: No edema.     Left lower leg: No edema.  Lymphadenopathy:     Cervical: No cervical adenopathy.  Skin:    General: Skin is warm and dry.     Capillary Refill: Capillary refill takes less than 2 seconds.     Coloration: Skin is not cyanotic, jaundiced or pale.     Findings: No rash.  Neurological:     General: No focal deficit present.     Mental Status: She is alert and oriented to person, place, and time.     Sensory: Sensation is intact.     Motor: Motor function is intact.     Coordination: Coordination is intact.     Gait: Gait is intact.     Deep Tendon Reflexes: Reflexes are normal and symmetric.  Psychiatric:        Attention and Perception: Attention and perception normal.  Mood and Affect: Mood and affect normal.        Speech: Speech normal.        Behavior: Behavior normal. Behavior is cooperative.        Thought Content: Thought content normal.        Cognition and Memory: Cognition and memory normal.        Judgment: Judgment normal.     Results for orders placed or performed during the hospital encounter of 12/15/22  Lipase, blood  Result Value Ref Range   Lipase 28 11 - 51 U/L  Comprehensive metabolic panel  Result Value Ref Range   Sodium 138 135 - 145 mmol/L   Potassium 3.8 3.5 - 5.1 mmol/L   Chloride 104 98 - 111 mmol/L   CO2 23 22 - 32 mmol/L   Glucose, Bld 183 (H) 70 - 99 mg/dL   BUN 14 8 - 23 mg/dL   Creatinine, Ser 1.30 0.44 - 1.00 mg/dL   Calcium 8.8 (L) 8.9 - 10.3 mg/dL   Total Protein 7.0 6.5 - 8.1 g/dL   Albumin 4.4 3.5 - 5.0 g/dL   AST 22 15 - 41 U/L   ALT 23 0 - 44 U/L   Alkaline Phosphatase 55 38 - 126 U/L   Total Bilirubin 1.8  (H) 0.3 - 1.2 mg/dL   GFR, Estimated >86 >57 mL/min   Anion gap 11 5 - 15  CBC  Result Value Ref Range   WBC 12.1 (H) 4.0 - 10.5 K/uL   RBC 5.03 3.87 - 5.11 MIL/uL   Hemoglobin 15.3 (H) 12.0 - 15.0 g/dL   HCT 84.6 96.2 - 95.2 %   MCV 90.3 80.0 - 100.0 fL   MCH 30.4 26.0 - 34.0 pg   MCHC 33.7 30.0 - 36.0 g/dL   RDW 84.1 32.4 - 40.1 %   Platelets 228 150 - 400 K/uL   nRBC 0.0 0.0 - 0.2 %  Urinalysis, Routine w reflex microscopic -Urine, Clean Catch  Result Value Ref Range   Color, Urine YELLOW YELLOW   APPearance CLEAR CLEAR   Specific Gravity, Urine 1.032 (H) 1.005 - 1.030   pH 5.0 5.0 - 8.0   Glucose, UA >=500 (A) NEGATIVE mg/dL   Hgb urine dipstick NEGATIVE NEGATIVE   Bilirubin Urine NEGATIVE NEGATIVE   Ketones, ur 20 (A) NEGATIVE mg/dL   Protein, ur NEGATIVE NEGATIVE mg/dL   Nitrite NEGATIVE NEGATIVE   Leukocytes,Ua NEGATIVE NEGATIVE       Pertinent labs & imaging results that were available during my care of the patient were reviewed by me and considered in my medical decision making.  Assessment & Plan:  Maria [name] was seen today for vaginitis and er follow up .  Assessment and Plan    Colitis Recent episode of severe abdominal pain, diarrhea, and vomiting. CT scan in ER showed inflammation and colitis. Symptoms have improved but still experiencing intermittent abdominal pain and nausea. No recent bowel movement since Saturday, raising concern for possible bowel obstruction. -Order stat CT scan of abdomen to evaluate for possible obstruction. -Continue Zofran and Imodium as needed.  Vulvar Itching/Burning Negative for yeast on wet prep. Possible postmenopausal dryness or lichen sclerosus. -Address abdominal issues first. -If symptoms persist, consider referral to gynecology.     Diagnoses and all orders for this visit:  Yeast infection of the vagina -     WET PREP FOR TRICH, YEAST, CLUE  Generalized abdominal pain Nausea and vomiting in adult patient Colitis -  CT ABDOMEN PELVIS W CONTRAST; Future     Continue all other maintenance medications.  Follow up plan: Return if symptoms worsen or fail to improve. Further treatment pending results.    Continue healthy lifestyle choices, including diet (rich in fruits, vegetables, and lean proteins, and low in salt and simple carbohydrates) and exercise (at least 30 minutes of moderate physical activity daily).    The above assessment and management plan was discussed with the patient. The patient verbalized understanding of and has agreed to the management plan. Patient is aware to call the clinic if they develop any new symptoms or if symptoms persist or worsen. Patient is aware when to return to the clinic for a follow-up visit. Patient educated on when it is appropriate to go to the emergency department.   Kari Baars, FNP-C [facility] [phone]

## 2023-01-01 ENCOUNTER — Other Ambulatory Visit: Payer: Self-pay | Admitting: Family Medicine

## 2023-01-01 DIAGNOSIS — J4540 Moderate persistent asthma, uncomplicated: Secondary | ICD-10-CM

## 2023-01-01 DIAGNOSIS — E1169 Type 2 diabetes mellitus with other specified complication: Secondary | ICD-10-CM

## 2023-01-01 DIAGNOSIS — K219 Gastro-esophageal reflux disease without esophagitis: Secondary | ICD-10-CM

## 2023-01-18 DIAGNOSIS — H524 Presbyopia: Secondary | ICD-10-CM | POA: Diagnosis not present

## 2023-01-18 DIAGNOSIS — E113211 Type 2 diabetes mellitus with mild nonproliferative diabetic retinopathy with macular edema, right eye: Secondary | ICD-10-CM | POA: Diagnosis not present

## 2023-01-18 DIAGNOSIS — E113312 Type 2 diabetes mellitus with moderate nonproliferative diabetic retinopathy with macular edema, left eye: Secondary | ICD-10-CM | POA: Diagnosis not present

## 2023-01-18 DIAGNOSIS — H26493 Other secondary cataract, bilateral: Secondary | ICD-10-CM | POA: Diagnosis not present

## 2023-01-18 DIAGNOSIS — H401132 Primary open-angle glaucoma, bilateral, moderate stage: Secondary | ICD-10-CM | POA: Diagnosis not present

## 2023-01-22 ENCOUNTER — Telehealth: Payer: Self-pay

## 2023-01-22 NOTE — Telephone Encounter (Signed)
Transition Care Management Follow-up Telephone Call Date of discharge and from where: Maria [name] 9/27 How have you been since you were released from the hospital? Patient has followed up with PCP and is getting better Any questions or concerns? No  Items Reviewed: Did the pt receive and understand the discharge instructions provided? Yes  Medications obtained and verified? No  Other? No  Any new allergies since your discharge? No  Dietary orders reviewed? No Do you have support at home? Yes     Follow up appointments reviewed:  PCP Hospital f/u appt confirmed? Yes  Scheduled to see  on  @ . Specialist Hospital f/u appt confirmed? No  Scheduled to see  on  @ . Are transportation arrangements needed? No  If their condition worsens, is the pt aware to call PCP or go to the Emergency Dept.? Yes Was the patient provided with contact information for the PCP's office or ED? Yes Was to pt encouraged to call back with questions or concerns? Yes

## 2023-02-09 ENCOUNTER — Encounter: Payer: Self-pay | Admitting: Family Medicine

## 2023-02-09 ENCOUNTER — Ambulatory Visit (INDEPENDENT_AMBULATORY_CARE_PROVIDER_SITE_OTHER): Payer: Medicare Other | Admitting: Family Medicine

## 2023-02-09 VITALS — BP 114/65 | HR 81 | Temp 97.1°F | Resp 20 | Ht 62.0 in | Wt 98.5 lb

## 2023-02-09 DIAGNOSIS — E1159 Type 2 diabetes mellitus with other circulatory complications: Secondary | ICD-10-CM | POA: Diagnosis not present

## 2023-02-09 DIAGNOSIS — E1169 Type 2 diabetes mellitus with other specified complication: Secondary | ICD-10-CM | POA: Diagnosis not present

## 2023-02-09 DIAGNOSIS — I152 Hypertension secondary to endocrine disorders: Secondary | ICD-10-CM

## 2023-02-09 DIAGNOSIS — E039 Hypothyroidism, unspecified: Secondary | ICD-10-CM | POA: Diagnosis not present

## 2023-02-09 DIAGNOSIS — R110 Nausea: Secondary | ICD-10-CM

## 2023-02-09 DIAGNOSIS — E785 Hyperlipidemia, unspecified: Secondary | ICD-10-CM | POA: Diagnosis not present

## 2023-02-09 DIAGNOSIS — Z794 Long term (current) use of insulin: Secondary | ICD-10-CM

## 2023-02-09 LAB — BAYER DCA HB A1C WAIVED: HB A1C (BAYER DCA - WAIVED): 5.4 % (ref 4.8–5.6)

## 2023-02-09 MED ORDER — ONDANSETRON HCL 4 MG PO TABS: 4.0000 mg | ORAL_TABLET | Freq: Three times a day (TID) | ORAL | 0 refills | Status: DC | PRN

## 2023-02-09 NOTE — Progress Notes (Signed)
Subjective:  Patient ID: Maria [name], female    DOB: 28-Apr-1955, 67 y.o.   MRN: [id number]  Patient Care Team: Sonny Masters, FNP as PCP - General (Family Medicine) Danella Maiers, [facility] as Pharmacist (Family Medicine) Mateo Flow, MD as Consulting Physician (Ophthalmology)   Chief Complaint:  Medical Management of Chronic Issues   HPI: Maria [name] is a 67 y.o. female presenting on 02/09/2023 for Medical Management of Chronic Issues   Discussed the use of AI scribe software for clinical note transcription with the patient, who gave verbal consent to proceed.  History of Present Illness   The patient, diagnosed with diabetes, recently experienced a head cold, which was managed with Doxil and Tylenol cold medicine. She reported no associated fevers. Her blood sugars have been low, but increased in the past week due to the illness. She has been on metformin and glipizide, but with the introduction of Medora, her blood sugar levels have been significantly lower.  The patient also reported intermittent nausea, occurring not every day, but persisting for a week at a time. She has been managing this with Zofran. She has not been monitoring her blood pressure at home, but is on Fybalis and Afrobrite for management. She denied experiencing headaches, chest pain, leg swelling, or shortness of breath.  The patient has been on Crestor 20 for cholesterol management, with no reported muscle aches or pains. She is also on Synthroid for thyroid management, and reported feeling cold frequently. She has intermittent diarrhea, but no worsening of this symptom. She denied any changes in hair, skin, or nails, or any significant weight changes not associated with Mounjaro.  Lastly, the patient reported persistent itching in the genital area, which has been managed with Bacillus and Lithostat. The itching is intermittent, with periods of three to four days of relief before returning.           Relevant past medical, surgical, family, and social history reviewed and updated as indicated.  Allergies and medications reviewed and updated. Data reviewed: Chart in Epic.   Past Medical History:  Diagnosis Date   Allergy    Asthma    Cancer (HCC)    cervical   Complication of anesthesia    " I woke and was short of breath."   CTS (carpal tunnel syndrome)    Diabetes mellitus without complication (HCC)    Early cataracts, bilateral    Fracture    trimalleolar ankle left   GERD (gastroesophageal reflux disease)    Glaucoma    Glaucoma    History of kidney stones    " I passed it"   Hyperlipidemia    Hypertension    Hypothyroidism    Neuropathy    Vitamin D deficiency    Wears glasses     Past Surgical History:  Procedure Laterality Date   ABDOMINAL HYSTERECTOMY     APPENDECTOMY     CARDIAC CATHETERIZATION  2006   negative   COLONOSCOPY  2009   negative   INNER EAR SURGERY Right 1981   left shoulder surgery Left 10/2009   ORIF ANKLE FRACTURE Left 09/05/2018   Procedure: OPEN REDUCTION INTERNAL FIXATION TRIMALLEOLAR FRACTURE POSSIBLE SYNDEMOSIS;  Surgeon: Terance Hart, MD;  Location: [facility] OR;  Service: Orthopedics;  Laterality: Left;   TONSILLECTOMY     TUBAL LIGATION      Social History   Socioeconomic History   Marital status: Married    Spouse name: Loraine Leriche   Number  of children: 2   Years of education: Not on file   Highest education level: Not on file  Occupational History   Not on file  Tobacco Use   Smoking status: Former    Current packs/day: 0.00    Average packs/day: 1 pack/day for 15.0 years (15.0 ttl pk-yrs)    Types: Cigarettes    Start date: 06/06/1986    Quit date: 06/05/2001    Years since quitting: 21.6   Smokeless tobacco: Never  Vaping Use   Vaping status: Never Used  Substance and Sexual Activity   Alcohol use: No   Drug use: No   Sexual activity: Not Currently  Other Topics Concern   Not on file  Social History  Narrative   2 sons.   3 grandchildren.   Social Determinants of Health   Financial Resource Strain: Low Risk  (02/20/2022)   Overall Financial Resource Strain (CARDIA)    Difficulty of Paying Living Expenses: Not hard at all  Food Insecurity: No Food Insecurity (02/20/2022)   Hunger Vital Sign    Worried About Running Out of Food in the Last Year: Never true    Ran Out of Food in the Last Year: Never true  Transportation Needs: No Transportation Needs (02/20/2022)   PRAPARE - Administrator, Civil Service (Medical): No    Lack of Transportation (Non-Medical): No  Physical Activity: Insufficiently Active (02/20/2022)   Exercise Vital Sign    Days of Exercise per Week: 3 days    Minutes of Exercise per Session: 30 min  Stress: No Stress Concern Present (02/20/2022)   [employer] of Occupational Health - Occupational Stress Questionnaire    Feeling of Stress : Not at all  Social Connections: Moderately Integrated (02/20/2022)   Social Connection and Isolation Panel [NHANES]    Frequency of Communication with Friends and Family: More than three times a week    Frequency of Social Gatherings with Friends and Family: More than three times a week    Attends Religious Services: 1 to 4 times per year    Active Member of [employer] or Organizations: No    Attends Banker Meetings: Never    Marital Status: Married  Catering manager Violence: Not At Risk (02/20/2022)   Humiliation, Afraid, Rape, and Kick questionnaire    Fear of Current or Ex-Partner: No    Emotionally Abused: No    Physically Abused: No    Sexually Abused: No    Outpatient Encounter Medications as of 02/09/2023  Medication Sig   acetaminophen (TYLENOL) 500 MG tablet Take 1,000 mg by mouth every 6 (six) hours as needed for moderate pain.   albuterol (VENTOLIN HFA) 108 (90 Base) MCG/ACT inhaler Inhale 2 puffs into the lungs every 6 (six) hours as needed for wheezi ng orshortness of breath.    bimatoprost (LUMIGAN) 0.01 % SOLN Place 1 drop into both eyes at bedtime.   calcium carbonate (OSCAL) 1500 (600 Ca) MG TABS tablet Take by mouth 2 (two) times daily with a meal.   cetirizine (ZYRTEC) 10 MG tablet 1 tablet Orally Once a day for 30 day(s)   Cholecalciferol (VITAMIN D3) 50 MCG (2000 UT) capsule 1 tablet   Continuous Blood Gluc Receiver (FREESTYLE LIBRE 2 READER) DEVI Use to test blood sugar continuously. DX: E11.65   Continuous Glucose Sensor (FREESTYLE LIBRE 2 SENSOR) MISC USE TO TEST BLOOD SUGAR CONTINUOUSLY   denosumab (PROLIA) 60 MG/ML SOSY injection Inject 60 mg into the skin every 6 (  six) months.   FARXIGA 10 MG TABS tablet TAKE ONE TABLET DAILY BEFORE BREAKFAST   levothyroxine (SYNTHROID) 75 MCG tablet TAKE ONE TABLET BY MOUTH DAILY   lisinopril (ZESTRIL) 5 MG tablet TAKE ONE TABLET DAILY FOR HIGH BLOOD PRESSURE   loperamide (IMODIUM A-D) 2 MG tablet Take 1 tablet (2 mg total) by mouth 4 (four) times daily as needed for diarrhea or loose stools.   LUMIGAN 0.01 % SOLN Place 1 drop into both eyes at bedtime.    montelukast (SINGULAIR) 10 MG tablet TAKE ONE TABLET ONCE DAILY   pantoprazole (PROTONIX) 40 MG tablet TAKE ONE TABLET ONCE DAILY   phenylephrine (SUDAFED PE) 10 MG TABS tablet Take 10 mg by mouth every 4 (four) hours as needed (sinus congestion).   polyethylene glycol powder (GLYCOLAX/MIRALAX) 17 GM/SCOOP powder Take 17 g by mouth daily.   PROLIA 60 MG/ML SOSY injection Inject 60 mg into the skin every 6 (six) months.   rosuvastatin (CRESTOR) 20 MG tablet Take 1 tablet (20 mg total) by mouth daily.   tirzepatide [facility]) 5 MG/0.5ML Pen Inject 5 mg into the skin once a week.   TRELEGY ELLIPTA 200-62.5-25 MCG/ACT AEPB INHALE 1 PUFF ONCE DAILY   triamcinolone cream (KENALOG) 0.1 % Apply 1 application topically 2 (two) times daily as needed (dry skin).   [DISCONTINUED] glipiZIDE (GLUCOTROL XL) 10 MG 24 hr tablet Take 2 tablets (20 mg total) by mouth daily with breakfast.    [DISCONTINUED] metFORMIN (GLUCOPHAGE) 1000 MG tablet TAKE ONE TABLET TWICE DAILY WITH MEAL(S)   [DISCONTINUED] ondansetron (ZOFRAN) 4 MG tablet Take 1 tablet (4 mg total) by mouth every 8 (eight) hours as needed for nausea or vomiting.   ondansetron (ZOFRAN) 4 MG tablet Take 1 tablet (4 mg total) by mouth every 8 (eight) hours as needed for nausea or vomiting.   vitamin B-12 (CYANOCOBALAMIN) 1000 MCG tablet Take 1,000 mcg by mouth daily.   [DISCONTINUED] ondansetron (ZOFRAN-ODT) 4 MG disintegrating tablet Take 1 tablet (4 mg total) by mouth every 8 (eight) hours as needed for nausea or vomiting.   No facility-administered encounter medications on file as of 02/09/2023.    Allergies  Allergen Reactions   Ozempic (0.25 Or 0.5 Mg-Dose) [Semaglutide(0.25 Or 0.5mg -Dos)] Nausea And Vomiting    N/v, no energy    Pertinent ROS per HPI, otherwise unremarkable      Objective:  BP 114/65   Pulse 81   Temp (!) 97.1 F (36.2 C) (Oral)   Resp 20   Ht 5\' 2"  (1.575 m)   Wt 98 lb 8 oz (44.7 kg)   SpO2 96%   BMI 18.02 kg/m    Wt Readings from Last 3 Encounters:  02/09/23 98 lb 8 oz (44.7 kg)  12/28/22 106 lb 3.2 oz (48.2 kg)  12/15/22 107 lb (48.5 kg)    Physical Exam Vitals and nursing note reviewed.  Constitutional:      General: She is not in acute distress.    Appearance: Normal appearance. She is well-developed, well-groomed and normal weight. She is not ill-appearing, toxic-appearing or diaphoretic.  HENT:     Head: Normocephalic and atraumatic.     Jaw: There is normal jaw occlusion.     Right Ear: Hearing normal.     Left Ear: Hearing normal.     Nose: Nose normal.     Mouth/Throat:     Lips: Pink.     Mouth: Mucous membranes are moist.     Pharynx: Oropharynx is clear. Uvula  midline.  Eyes:     General: Lids are normal.     Extraocular Movements: Extraocular movements intact.     Conjunctiva/sclera: Conjunctivae normal.     Pupils: Pupils are equal, round, and  reactive to light.  Neck:     Thyroid: No thyroid mass, thyromegaly or thyroid tenderness.     Vascular: No carotid bruit or JVD.     Trachea: Trachea and phonation normal.  Cardiovascular:     Rate and Rhythm: Normal rate and regular rhythm.     Chest Wall: PMI is not displaced.     Pulses: Normal pulses.     Heart sounds: Normal heart sounds. No murmur heard.    No friction rub. No gallop.  Pulmonary:     Effort: Pulmonary effort is normal. No respiratory distress.     Breath sounds: Normal breath sounds. No wheezing.  Abdominal:     General: Bowel sounds are normal. There is no distension or abdominal bruit.     Palpations: Abdomen is soft. There is no hepatomegaly or splenomegaly.     Tenderness: There is no abdominal tenderness. There is no right CVA tenderness or left CVA tenderness.     Hernia: No hernia is present.  Musculoskeletal:        General: Normal range of motion.     Cervical back: Normal range of motion and neck supple.     Right lower leg: No edema.     Left lower leg: No edema.  Lymphadenopathy:     Cervical: No cervical adenopathy.  Skin:    General: Skin is warm and dry.     Capillary Refill: Capillary refill takes less than 2 seconds.     Coloration: Skin is not cyanotic, jaundiced or pale.     Findings: No rash.  Neurological:     General: No focal deficit present.     Mental Status: She is alert and oriented to person, place, and time.     Sensory: Sensation is intact.     Motor: Motor function is intact.     Coordination: Coordination is intact.     Gait: Gait is intact.     Deep Tendon Reflexes: Reflexes are normal and symmetric.  Psychiatric:        Attention and Perception: Attention and perception normal.        Mood and Affect: Mood and affect normal.        Speech: Speech normal.        Behavior: Behavior normal. Behavior is cooperative.        Thought Content: Thought content normal.        Cognition and Memory: Cognition and memory  normal.        Judgment: Judgment normal.    Physical Exam   NECK: Thyroid gland without nodules or enlargement. EXTREMITIES: No leg swelling.        Results for orders placed or performed in visit on 12/28/22  WET PREP FOR TRICH, YEAST, CLUE   Specimen: Vaginal Swab   Vaginal Swab  Result Value Ref Range   Trichomonas Exam Negative Negative   Yeast Exam Negative Negative   Clue Cell Exam Negative Negative       Pertinent labs & imaging results that were available during my care of the patient were reviewed by me and considered in my medical decision making.  Assessment & Plan:  Aaryah was seen today for medical management of chronic issues.  Diagnoses and all orders for this visit:  Type 2 diabetes mellitus with other specified complication, with long-term current use of insulin (HCC) -     CBC with Differential/Platelet -     CMP14+EGFR -     Lipid panel -     Thyroid Panel With TSH -     Microalbumin / creatinine urine ratio -     Bayer DCA Hb A1c Waived  Hyperlipidemia associated with type 2 diabetes mellitus (HCC) -     CBC with Differential/Platelet -     CMP14+EGFR -     Lipid panel -     Thyroid Panel With TSH -     Microalbumin / creatinine urine ratio -     Bayer DCA Hb A1c Waived  Hypertension associated with type 2 diabetes mellitus (HCC) -     CBC with Differential/Platelet -     CMP14+EGFR -     Lipid panel -     Thyroid Panel With TSH -     Microalbumin / creatinine urine ratio -     Bayer DCA Hb A1c Waived  Acquired hypothyroidism -     CBC with Differential/Platelet -     CMP14+EGFR -     Lipid panel -     Thyroid Panel With TSH -     Microalbumin / creatinine urine ratio -     Bayer DCA Hb A1c Waived  Nausea in adult -     ondansetron (ZOFRAN) 4 MG tablet; Take 1 tablet (4 mg total) by mouth every 8 (eight) hours as needed for nausea or vomiting.     Assessment and Plan    Type 2 Diabetes Mellitus Blood sugars have been low, with  recent elevations due to illness. A1c is 5.4. Currently on [place]. Plan to discontinue metformin and glipizide due to well-controlled A1c and risk of hypoglycemia. Discussed the importance of avoiding hypoglycemia and the plan to monitor blood sugars closely. If A1c remains at goal, glipizide will be discontinued permanently. - Discontinue metformin - Discontinue glipizide - Continue Farxiga - Continue Mounjaro - Follow-up in three months to reassess blood sugar control and A1c  Nausea Intermittent nausea managed with Zofran. QT interval was normal on last EKG in February. Discussed the need for annual EKGs due to the risk of QT prolongation with Zofran. - Repeat EKG at next visit to monitor QT interval due to Zofran use  Hypertension No recent issues with blood pressure. No headaches, chest pain, leg swelling, or shortness of breath. Currently on Fybalis and Afrobrite. - Continue Fybalis and Afrobrite  Hyperlipidemia No muscle aches or pains reported with Crestor 20 mg. - Continue Crestor 20 mg  Hypothyroidism No significant thyroid-related symptoms reported. Currently on Synthroid. Reports feeling cold. - Continue Synthroid  Lichen Sclerosus Persistent itching in the genital area. Previous use of Vagisil provided temporary relief. Considering over-the-counter lubricants Lofena and Refresh. Discussed the potential need for hormone cream if symptoms persist. - Try Lofena or Refresh for itching - Consider hormone cream if symptoms persist - Reassess at next visit  General Health Maintenance Annual EKG due to Zofran use. Recent eye exam on Halloween was normal. No changes in bladder habits, hair, skin, or nails. No significant weight changes not associated with Mounjaro. - Order EKG at next visit - Review labs at next visit  Follow-up - Follow-up in three months - Monitor for any changes and report if necessary.          Continue all other maintenance  medications.  Follow  up plan: Return in about 3 months (around 05/12/2023), or if symptoms worsen or fail to improve, for DM.   Continue healthy lifestyle choices, including diet (rich in fruits, vegetables, and lean proteins, and low in salt and simple carbohydrates) and exercise (at least 30 minutes of moderate physical activity daily).  Educational handout given for DM  The above assessment and management plan was discussed with the patient. The patient verbalized understanding of and has agreed to the management plan. Patient is aware to call the clinic if they develop any new symptoms or if symptoms persist or worsen. Patient is aware when to return to the clinic for a follow-up visit. Patient educated on when it is appropriate to go to the emergency department.   Kari Baars, FNP-C [facility] [phone]

## 2023-02-09 NOTE — Patient Instructions (Addendum)
Maria [name] and Rephresh    Continue to monitor your blood sugars as we discussed and record them. Bring the log to your next appointment.  Take your medications as directed.    Goal Blood glucose:    Fasting (before meals) = 80 to 130   Within 2 hours of eating = less than 180   Understanding your Hemoglobin A1c: 5.4     Diabetes Mellitus and Nutrition    I think that you would greatly benefit from seeing a nutritionist. If this is something you are interested in, please call Dr Gerilyn Pilgrim at [phone] to schedule an appointment.   When you have diabetes (diabetes mellitus), it is very important to have healthy eating habits because your blood sugar (glucose) levels are greatly affected by what you eat and drink. Eating healthy foods in the appropriate amounts, at about the same times every day, can help you: Control your blood glucose. Lower your risk of heart disease. Improve your blood pressure. Reach or maintain a healthy weight.  Every person with diabetes is different, and each person has different needs for a meal plan. Your health care provider may recommend that you work with a diet and nutrition specialist (dietitian) to make a meal plan that is best for you. Your meal plan may vary depending on factors such as: The calories you need. The medicines you take. Your weight. Your blood glucose, blood pressure, and cholesterol levels. Your activity level. Other health conditions you have, such as heart or kidney disease.  How do carbohydrates affect me? Carbohydrates affect your blood glucose level more than any other type of food. Eating carbohydrates naturally increases the amount of glucose in your blood. Carbohydrate counting is a method for keeping track of how many carbohydrates you eat. Counting carbohydrates is important to keep your blood glucose at a healthy level, especially if you use insulin or take certain oral diabetes medicines. It is important to know how many  carbohydrates you can safely have in each meal. This is different for every person. Your dietitian can help you calculate how many carbohydrates you should have at each meal and for snack. Foods that contain carbohydrates include: Bread, cereal, rice, pasta, and crackers. Potatoes and corn. Peas, beans, and lentils. Milk and yogurt. Fruit and juice. Desserts, such as cakes, cookies, ice cream, and candy.  How does alcohol affect me? Alcohol can cause a sudden decrease in blood glucose (hypoglycemia), especially if you use insulin or take certain oral diabetes medicines. Hypoglycemia can be a life-threatening condition. Symptoms of hypoglycemia (sleepiness, dizziness, and confusion) are similar to symptoms of having too much alcohol. If your health care provider says that alcohol is safe for you, follow these guidelines: Limit alcohol intake to no more than 1 drink per day for nonpregnant women and 2 drinks per day for men. One drink equals 12 oz of beer, 5 oz of wine, or 1 oz of hard liquor. Do not drink on an empty stomach. Keep yourself hydrated with water, diet soda, or unsweetened iced tea. Keep in mind that regular soda, juice, and other mixers may contain a lot of sugar and must be counted as carbohydrates.  What are tips for following this plan?  Reading food labels Start by checking the serving size on the label. The amount of calories, carbohydrates, fats, and other nutrients listed on the label are based on one serving of the food. Many foods contain more than one serving per package. Check the total grams (g) of carbohydrates  in one serving. You can calculate the number of servings of carbohydrates in one serving by dividing the total carbohydrates by 15. For example, if a food has 30 g of total carbohydrates, it would be equal to 2 servings of carbohydrates. Check the number of grams (g) of saturated and trans fats in one serving. Choose foods that have low or no amount of these  fats. Check the number of milligrams (mg) of sodium in one serving. Most people should limit total sodium intake to less than 2,300 mg per day. Always check the nutrition information of foods labeled as "low-fat" or "nonfat". These foods may be higher in added sugar or refined carbohydrates and should be avoided. Talk to your dietitian to identify your daily goals for nutrients listed on the label.  Shopping Avoid buying canned, premade, or processed foods. These foods tend to be high in fat, sodium, and added sugar. Shop around the outside edge of the grocery store. This includes fresh fruits and vegetables, bulk grains, fresh meats, and fresh dairy.  Cooking Use low-heat cooking methods, such as baking, instead of high-heat cooking methods like deep frying. Cook using healthy oils, such as olive, canola, or sunflower oil. Avoid cooking with butter, cream, or high-fat meats.  Meal planning Eat meals and snacks regularly, preferably at the same times every day. Avoid going long periods of time without eating. Eat foods high in fiber, such as fresh fruits, vegetables, beans, and whole grains. Talk to your dietitian about how many servings of carbohydrates you can eat at each meal. Eat 4-6 ounces of lean protein each day, such as lean meat, chicken, fish, eggs, or tofu. 1 ounce is equal to 1 ounce of meat, chicken, or fish, 1 egg, or 1/4 cup of tofu. Eat some foods each day that contain healthy fats, such as avocado, nuts, seeds, and fish.  Lifestyle  Check your blood glucose regularly. Exercise at least 30 minutes 5 or more days each week, or as told by your health care provider. Take medicines as told by your health care provider. Do not use any products that contain nicotine or tobacco, such as cigarettes and e-cigarettes. If you need help quitting, ask your health care provider. Work with a Veterinary surgeon or diabetes educator to identify strategies to manage stress and any emotional and social  challenges.  What are some questions to ask my health care provider? Do I need to meet with a diabetes educator? Do I need to meet with a dietitian? What number can I call if I have questions? When are the best times to check my blood glucose?  Where to find more information: American Diabetes Association: diabetes.org/food-and-fitness/food Academy of Nutrition and Dietetics: https://www.vargas.com/ [employer] of Diabetes and Digestive and Kidney Diseases (NIH): FindJewelers.cz  Summary A healthy meal plan will help you control your blood glucose and maintain a healthy lifestyle. Working with a diet and nutrition specialist (dietitian) can help you make a meal plan that is best for you. Keep in mind that carbohydrates and alcohol have immediate effects on your blood glucose levels. It is important to count carbohydrates and to use alcohol carefully. This information is not intended to replace advice given to you by your health care provider. Make sure you discuss any questions you have with your health care provider. Document Released: 12/01/2004 Document Revised: 04/10/2016 Document Reviewed: 04/10/2016 Elsevier Interactive Patient Education  [employer].

## 2023-02-10 LAB — LIPID PANEL
Chol/HDL Ratio: 2.3 ratio (ref 0.0–4.4)
Cholesterol, Total: 104 mg/dL (ref 100–199)
HDL: 46 mg/dL (ref >39)
LDL Chol Calc (NIH): 35 mg/dL (ref 0–99)
Triglycerides: 131 mg/dL (ref 0–149)
VLDL Cholesterol Cal: 23 mg/dL (ref 5–40)

## 2023-02-10 LAB — CMP14+EGFR
ALT: 14 [IU]/L (ref 0–32)
AST: 17 IU/L (ref 0–40)
Albumin: 4.2 g/dL (ref 3.9–4.9)
Alkaline Phosphatase: 67 IU/L (ref 44–121)
BUN/Creatinine Ratio: 20 (ref 12–28)
BUN: 11 mg/dL (ref 8–27)
Bilirubin Total: 1.2 mg/dL (ref 0.0–1.2)
CO2: 22 mmol/L (ref 20–29)
Calcium: 8.9 mg/dL (ref 8.7–10.3)
Chloride: 104 mmol/L (ref 96–106)
Creatinine, Ser: 0.56 mg/dL — ABNORMAL LOW (ref 0.57–1.00)
Globulin, Total: 2 g/dL (ref 1.5–4.5)
Glucose: 130 mg/dL — ABNORMAL HIGH (ref 70–99)
Potassium: 3.6 mmol/L (ref 3.5–5.2)
Sodium: 140 mmol/L (ref 134–144)
Total Protein: 6.2 g/dL (ref 6.0–8.5)
eGFR: 101 mL/min/{1.73_m2} (ref >59)

## 2023-02-10 LAB — CBC WITH DIFFERENTIAL/PLATELET
Basophils Absolute: 0 10*3/uL (ref 0.0–0.2)
Basos: 1 %
EOS (ABSOLUTE): 0.1 10*3/uL (ref 0.0–0.4)
Eos: 2 %
Hematocrit: 42.4 % (ref 34.0–46.6)
Hemoglobin: 14.1 g/dL (ref 11.1–15.9)
Immature Grans (Abs): 0 10*3/uL (ref 0.0–0.1)
Immature Granulocytes: 0 %
Lymphocytes Absolute: 1.2 10*3/uL (ref 0.7–3.1)
Lymphs: 23 %
MCH: 30.9 pg (ref 26.6–33.0)
MCHC: 33.3 g/dL (ref 31.5–35.7)
MCV: 93 fL (ref 79–97)
Monocytes Absolute: 0.5 10*3/uL (ref 0.1–0.9)
Monocytes: 10 %
Neutrophils Absolute: 3.3 10*3/uL (ref 1.4–7.0)
Neutrophils: 64 %
Platelets: 214 10*3/uL (ref 150–450)
RBC: 4.57 x10E6/uL (ref 3.77–5.28)
RDW: 12.8 % (ref 11.7–15.4)
WBC: 5.1 10*3/uL (ref 3.4–10.8)

## 2023-02-10 LAB — THYROID PANEL WITH TSH
Free Thyroxine Index: 3.4 (ref 1.2–4.9)
T3 Uptake Ratio: 36 % (ref 24–39)
T4, Total: 9.5 ug/dL (ref 4.5–12.0)
TSH: 0.173 u[IU]/mL — ABNORMAL LOW (ref 0.450–4.500)

## 2023-02-10 LAB — MICROALBUMIN / CREATININE URINE RATIO
Creatinine, Urine: 65 mg/dL
Microalb/Creat Ratio: 12 mg/g{creat} (ref 0–29)
Microalbumin, Urine: 7.8 ug/mL

## 2023-02-12 MED ORDER — LEVOTHYROXINE SODIUM 50 MCG PO TABS
50.0000 ug | ORAL_TABLET | Freq: Every day | ORAL | 3 refills | Status: DC
Start: 2023-02-12 — End: 2023-12-24

## 2023-02-12 NOTE — Addendum Note (Signed)
Addended by: Sonny Masters on: 02/12/2023 09:15 PM   Modules accepted: Orders

## 2023-02-14 ENCOUNTER — Telehealth: Payer: Self-pay | Admitting: Family Medicine

## 2023-02-14 ENCOUNTER — Encounter: Payer: Self-pay | Admitting: Family Medicine

## 2023-02-14 MED ORDER — FLUCONAZOLE 150 MG PO TABS: ORAL_TABLET | ORAL | 0 refills | Status: DC

## 2023-02-14 NOTE — Telephone Encounter (Signed)
Copied from CRM [id number]. Topic: Clinical - Medication Question >> Feb 14, 2023  2:57 PM Amy B wrote: Reason for CRM: Patient states she has a yeast infection and would like to have a prescription faxed to her pharmacy.

## 2023-02-14 NOTE — Telephone Encounter (Signed)
Patient has vaginal discharge, discomfort and itching for the last couple of days.  She was seen last month for same and did OTC yeast remedies.  She said things improved but never completely went away but have come back much much worse in last two days.  She would like to know if you will send in Diflucan to [facility].  I explained to patient that I would pass information along to you but that because it is so late in the date and also a holiday that this may possibly not get done.  I told her to check with her pharmacy on Friday and if nothing was called in I recommended she go to urgent care for treatment.

## 2023-02-22 ENCOUNTER — Telehealth: Payer: Self-pay

## 2023-02-22 ENCOUNTER — Ambulatory Visit: Payer: Medicare Other

## 2023-02-22 VITALS — Ht 62.0 in | Wt 98.0 lb

## 2023-02-22 DIAGNOSIS — Z0000 Encounter for general adult medical examination without abnormal findings: Secondary | ICD-10-CM | POA: Diagnosis not present

## 2023-02-22 NOTE — Progress Notes (Signed)
Subjective:   Maria [name] is a 68 y.o. female who presents for Medicare Annual (Subsequent) preventive examination.  Visit Complete: Virtual I connected with  [name] on 02/22/23 by a audio enabled telemedicine application and verified that I am speaking with the correct person using two identifiers.  Patient Location: Home  Provider Location: Home Office  I discussed the limitations of evaluation and management by telemedicine. The patient expressed understanding and agreed to proceed.  Vital Signs: Because this visit was a virtual/telehealth visit, some criteria may be missing or patient reported. Any vitals not documented were not able to be obtained and vitals that have been documented are patient reported.  Cardiac Risk Factors include: advanced age (>78men, >68 women);diabetes mellitus;hypertension     Objective:    Today's Vitals   02/22/23 1354  Weight: 98 lb (44.5 kg)  Height: 5\' 2"  (1.575 m)   Body mass index is 17.92 kg/m.     02/20/2022    1:26 PM 02/16/2021    1:24 PM 09/04/2018   11:16 AM 08/22/2018    5:28 PM 04/08/2017    4:36 PM 06/22/2014   10:18 AM  Advanced Directives  Does Patient Have a Medical Advance Directive? No No No No No No  Would patient like information on creating a medical advance directive? No - Patient declined No - Patient declined No - Patient declined No - Patient declined  No - patient declined information    Current Medications (verified) Outpatient Encounter Medications as of 02/22/2023  Medication Sig   acetaminophen (TYLENOL) 500 MG tablet Take 1,000 mg by mouth every 6 (six) hours as needed for moderate pain.   albuterol (VENTOLIN HFA) 108 (90 Base) MCG/ACT inhaler Inhale 2 puffs into the lungs every 6 (six) hours as needed for wheezi ng orshortness of breath.   bimatoprost (LUMIGAN) 0.01 % SOLN Place 1 drop into both eyes at bedtime.   calcium carbonate (OSCAL) 1500 (600 Ca) MG TABS tablet Take by mouth 2 (two) times  daily with a meal.   cetirizine (ZYRTEC) 10 MG tablet 1 tablet Orally Once a day for 30 day(s)   Cholecalciferol (VITAMIN D3) 50 MCG (2000 UT) capsule 1 tablet   Continuous Blood Gluc Receiver (FREESTYLE LIBRE 2 READER) DEVI Use to test blood sugar continuously. DX: E11.65   Continuous Glucose Sensor (FREESTYLE LIBRE 2 SENSOR) MISC USE TO TEST BLOOD SUGAR CONTINUOUSLY   denosumab (PROLIA) 60 MG/ML SOSY injection Inject 60 mg into the skin every 6 (six) months.   FARXIGA 10 MG TABS tablet TAKE ONE TABLET DAILY BEFORE BREAKFAST   fluconazole (DIFLUCAN) 150 MG tablet 1 po q week x 4 weeks   levothyroxine (SYNTHROID) 50 MCG tablet Take 1 tablet (50 mcg total) by mouth daily.   lisinopril (ZESTRIL) 5 MG tablet TAKE ONE TABLET DAILY FOR HIGH BLOOD PRESSURE   loperamide (IMODIUM A-D) 2 MG tablet Take 1 tablet (2 mg total) by mouth 4 (four) times daily as needed for diarrhea or loose stools.   LUMIGAN 0.01 % SOLN Place 1 drop into both eyes at bedtime.    montelukast (SINGULAIR) 10 MG tablet TAKE ONE TABLET ONCE DAILY   ondansetron (ZOFRAN) 4 MG tablet Take 1 tablet (4 mg total) by mouth every 8 (eight) hours as needed for nausea or vomiting.   pantoprazole (PROTONIX) 40 MG tablet TAKE ONE TABLET ONCE DAILY   phenylephrine (SUDAFED PE) 10 MG TABS tablet Take 10 mg by mouth every 4 (four) hours as  needed (sinus congestion).   polyethylene glycol powder (GLYCOLAX/MIRALAX) 17 GM/SCOOP powder Take 17 g by mouth daily.   PROLIA 60 MG/ML SOSY injection Inject 60 mg into the skin every 6 (six) months.   rosuvastatin (CRESTOR) 20 MG tablet Take 1 tablet (20 mg total) by mouth daily.   tirzepatide [facility]) 5 MG/0.5ML Pen Inject 5 mg into the skin once a week.   TRELEGY ELLIPTA 200-62.5-25 MCG/ACT AEPB INHALE 1 PUFF ONCE DAILY   triamcinolone cream (KENALOG) 0.1 % Apply 1 application topically 2 (two) times daily as needed (dry skin).   vitamin B-12 (CYANOCOBALAMIN) 1000 MCG tablet Take 1,000 mcg by mouth  daily.   No facility-administered encounter medications on file as of 02/22/2023.    Allergies (verified) Ozempic (0.25 or 0.5 mg-dose) [semaglutide(0.25 or 0.5mg -dos)]   History: Past Medical History:  Diagnosis Date   Allergy    Asthma    Cancer (HCC)    cervical   Complication of anesthesia    " I woke and was short of breath."   CTS (carpal tunnel syndrome)    Diabetes mellitus without complication (HCC)    Early cataracts, bilateral    Fracture    trimalleolar ankle left   GERD (gastroesophageal reflux disease)    Glaucoma    Glaucoma    History of kidney stones    " I passed it"   Hyperlipidemia    Hypertension    Hypothyroidism    Neuropathy    Vitamin D deficiency    Wears glasses    Past Surgical History:  Procedure Laterality Date   ABDOMINAL HYSTERECTOMY     APPENDECTOMY     CARDIAC CATHETERIZATION  2006   negative   COLONOSCOPY  2009   negative   INNER EAR SURGERY Right 1981   left shoulder surgery Left 10/2009   ORIF ANKLE FRACTURE Left 09/05/2018   Procedure: OPEN REDUCTION INTERNAL FIXATION TRIMALLEOLAR FRACTURE POSSIBLE SYNDEMOSIS;  Surgeon: Terance Hart, MD;  Location: [facility] OR;  Service: Orthopedics;  Laterality: Left;   TONSILLECTOMY     TUBAL LIGATION     Family History  Problem Relation Age of Onset   Diabetes Mother    Prostate cancer Father    Heart disease Father    Heart attack Other    Breast cancer Neg Hx    Social History   Socioeconomic History   Marital status: Married    Spouse name: Loraine Leriche   Number of children: 2   Years of education: Not on file   Highest education level: Not on file  Occupational History   Not on file  Tobacco Use   Smoking status: Former    Current packs/day: 0.00    Average packs/day: 1 pack/day for 15.0 years (15.0 ttl pk-yrs)    Types: Cigarettes    Start date: 06/06/1986    Quit date: 06/05/2001    Years since quitting: 21.7   Smokeless tobacco: Never  Vaping Use   Vaping status: Never  Used  Substance and Sexual Activity   Alcohol use: No   Drug use: No   Sexual activity: Not Currently  Other Topics Concern   Not on file  Social History Narrative   2 sons.   3 grandchildren.   Social Determinants of Health   Financial Resource Strain: Low Risk  (02/22/2023)   Overall Financial Resource Strain (CARDIA)    Difficulty of Paying Living Expenses: Not hard at all  Food Insecurity: No Food Insecurity (02/22/2023)   Hunger  Vital Sign    Worried About Programme researcher, broadcasting/film/video in the Last Year: Never true    Ran Out of Food in the Last Year: Never true  Transportation Needs: No Transportation Needs (02/22/2023)   PRAPARE - Administrator, Civil Service (Medical): No    Lack of Transportation (Non-Medical): No  Physical Activity: Insufficiently Active (02/22/2023)   Exercise Vital Sign    Days of Exercise per Week: 3 days    Minutes of Exercise per Session: 30 min  Stress: No Stress Concern Present (02/22/2023)   [employer] of Occupational Health - Occupational Stress Questionnaire    Feeling of Stress : Not at all  Social Connections: Moderately Integrated (02/22/2023)   Social Connection and Isolation Panel [NHANES]    Frequency of Communication with Friends and Family: More than three times a week    Frequency of Social Gatherings with Friends and Family: Three times a week    Attends Religious Services: 1 to 4 times per year    Active Member of Clubs or Organizations: No    Attends Banker Meetings: Never    Marital Status: Married    Tobacco Counseling Counseling given: Not Answered   Clinical Intake:  Pre-visit preparation completed: Yes  Pain : No/denies pain     Diabetes: No  How often do you need to have someone help you when you read instructions, pamphlets, or other written materials from your doctor or pharmacy?: 1 - Never  Interpreter Needed?: No  Information entered by :: Kandis Fantasia LPN   Activities of Daily  Living    02/22/2023    1:58 PM  In your present state of health, do you have any difficulty performing the following activities:  Hearing? 0  Vision? 0  Difficulty concentrating or making decisions? 0  Walking or climbing stairs? 0  Dressing or bathing? 0  Doing errands, shopping? 0  Preparing Food and eating ? N  Using the Toilet? N  In the past six months, have you accidently leaked urine? N  Do you have problems with loss of bowel control? N  Managing your Medications? N  Managing your Finances? N  Housekeeping or managing your Housekeeping? N    Patient Care Team: Sonny Masters, FNP as PCP - General (Family Medicine) Danella Maiers, [facility] as Pharmacist (Family Medicine) Mateo Flow, MD as Consulting Physician (Ophthalmology)  Indicate any recent Medical Services you may have received from other than [facility] providers in the past year (date may be approximate).     Assessment:   This is a routine wellness examination for Maria [name].  Hearing/Vision screen Hearing Screening - Comments:: Denies hearing difficulties   Vision Screening - Comments:: Wears rx glasses - up to date with routine eye exams with [facility]     Goals Addressed   None   Depression Screen    02/22/2023    1:56 PM 02/09/2023    9:28 AM 11/09/2022    8:12 AM 08/09/2022    8:25 AM 05/04/2022    8:33 AM 02/20/2022    1:25 PM 02/01/2022    9:00 AM  PHQ 2/9 Scores  PHQ - 2 Score 0 0 0 0 0 0 0  PHQ- 9 Score 0 0 0 3 0 0 3    Fall Risk    02/22/2023    1:58 PM 02/09/2023    9:27 AM 11/09/2022    8:12 AM 08/09/2022    8:25 AM 05/04/2022  8:33 AM  Fall Risk   Falls in the past year? 0 0 0 0 0  Number falls in past yr: 0      Injury with Fall? 0      Risk for fall due to : No Fall Risks      Follow up Falls prevention discussed;Education provided;Falls evaluation completed Falls evaluation completed       MEDICARE RISK AT HOME: Medicare Risk at Home Any stairs in or around the home?: No If  so, are there any without handrails?: No Home free of loose throw rugs in walkways, pet beds, electrical cords, etc?: Yes Adequate lighting in your home to reduce risk of falls?: Yes Life alert?: No Use of a cane, walker or w/c?: No Grab bars in the bathroom?: Yes Shower chair or bench in shower?: No Elevated toilet seat or a handicapped toilet?: Yes  TIMED UP AND GO:  Was the test performed?  No    Cognitive Function:        02/22/2023    2:00 PM 02/20/2022    1:27 PM 02/16/2021    1:28 PM  6CIT Screen  What Year? 0 points 0 points 0 points  What month? 0 points 0 points 0 points  What time? 0 points 0 points 0 points  Count back from 20 0 points 0 points 0 points  Months in reverse 0 points 0 points 0 points  Repeat phrase 0 points 0 points 0 points  Total Score 0 points 0 points 0 points    Immunizations Immunization History  Administered Date(s) Administered   Fluad Quad(high Dose 65+) 12/12/2021   Fluad Trivalent(High Dose 65+) 12/07/2022   Influenza Split 12/12/2012   Influenza,inj,Quad PF,6+ Mos 01/07/2014, 01/04/2015, 01/13/2016, 12/03/2017, 12/14/2018, 03/02/2020   Influenza-Unspecified 12/18/2020   PNEUMOCOCCAL CONJUGATE-20 08/09/2022   Pneumococcal-Unspecified 01/12/2009, 12/18/2020   Tdap 06/25/2013   Zoster Recombinant(Shingrix) 06/29/2021, 10/12/2021    TDAP status: Up to date  Flu Vaccine status: Up to date  Pneumococcal vaccine status: Up to date  Covid-19 vaccine status: Information provided on how to obtain vaccines.   Qualifies for Shingles Vaccine? Yes   Zostavax completed No   Shingrix Completed?: Yes  Screening Tests Health Maintenance  Topic Date Due   COVID-19 Vaccine (1) Never done   Colonoscopy  10/07/2017   OPHTHALMOLOGY EXAM  12/16/2022   DTaP/Tdap/Td (2 - Td or Tdap) 06/26/2023   DEXA SCAN  06/30/2023   MAMMOGRAM  07/31/2023   HEMOGLOBIN A1C  08/09/2023   FOOT EXAM  11/09/2023   Diabetic kidney evaluation - eGFR  measurement  02/09/2024   Diabetic kidney evaluation - Urine ACR  02/09/2024   Medicare Annual Wellness (AWV)  02/22/2024   Pneumonia Vaccine 34+ Years old  Completed   INFLUENZA VACCINE  Completed   Hepatitis C Screening  Completed   Zoster Vaccines- Shingrix  Completed   HPV VACCINES  Aged Out    Health Maintenance  Health Maintenance Due  Topic Date Due   COVID-19 Vaccine (1) Never done   Colonoscopy  10/07/2017   OPHTHALMOLOGY EXAM  12/16/2022    Colorectal cancer screening:  Patient declines at this time   Mammogram status: Completed 07/31/22. Repeat every year  Bone Density status: Completed 06/29/21. Results reflect: Bone density results: OSTEOPOROSIS. Repeat every 2 years.  Lung Cancer Screening: (Low Dose CT Chest recommended if Age 23-80 years, 20 pack-year currently smoking OR have quit w/in 15years.) does not qualify.   Lung Cancer Screening Referral: n/a  Additional Screening:  Hepatitis C Screening: does qualify; Completed 12/01/20  Vision Screening: Recommended annual ophthalmology exams for early detection of glaucoma and other disorders of the eye. Is the patient up to date with their annual eye exam?  Yes  Who is the provider or what is the name of the office in which the patient attends annual eye exams? [facility] If pt is not established with a provider, would they like to be referred to a provider to establish care? No .   Dental Screening: Recommended annual dental exams for proper oral hygiene  Diabetic Foot Exam: Diabetic Foot Exam: Overdue, Pt has been advised about the importance in completing this exam. Pt is scheduled for diabetic foot exam on at next office visit .  Community Resource Referral / Chronic Care Management: CRR required this visit?  No   CCM required this visit?  No     Plan:     I have personally reviewed and noted the following in the patient's chart:   Medical and social history Use of alcohol, tobacco or illicit  drugs  Current medications and supplements including opioid prescriptions. Patient is not currently taking opioid prescriptions. Functional ability and status Nutritional status Physical activity Advanced directives List of other physicians Hospitalizations, surgeries, and ER visits in previous 12 months Vitals Screenings to include cognitive, depression, and falls Referrals and appointments  In addition, I have reviewed and discussed with patient certain preventive protocols, quality metrics, and best practice recommendations. A written personalized care plan for preventive services as well as general preventive health recommendations were provided to patient.     Kandis Fantasia [place], [place]   [id number]/[id number]   After Visit Summary: (Mail) Due to this being a telephonic visit, the after visit summary with patients personalized plan was offered to patient via mail   Nurse Notes: No concerns at this time

## 2023-02-22 NOTE — Telephone Encounter (Signed)
Patient seen for AWV and was asking about process for her next Prolia injection.  Please advise.

## 2023-02-22 NOTE — Progress Notes (Signed)
[facility] - Clinic Note  03/05/2023     CHIEF COMPLAINT Patient presents for Retina Follow Up    HISTORY OF PRESENT ILLNESS: Maria [name] is a 67 y.o. female who presents to the clinic today for:   HPI     Retina Follow Up   Patient presents with  Diabetic Retinopathy.  In both eyes.  Severity is moderate.  Duration of 4 months.  Since onset it is stable.  I, the attending physician,  performed the HPI with the patient and updated documentation appropriately.        Comments   4 Month Retina eval. Patient blood sugar 180 and A1c 5.7. Patient states no vision changes      Last edited by Rennis Chris, MD on 03/06/2023 11:14 PM.     Patient states she started [facility] and her A1c is now 5.2 in November, she was taken off metformin and glipizide  Referring physician: Mateo Flow, MD [address] [place], [place] [address]  HISTORICAL INFORMATION:   Selected notes from the MEDICAL RECORD NUMBER Referred by Dr. Elmer Picker LEE: 01/28/2020 BCVA: 20/20 OU NPDR OU, PCIOL OU, possible CME OS, POAG OU    CURRENT MEDICATIONS: Current Outpatient Medications (Ophthalmic Drugs)  Medication Sig   bimatoprost (LUMIGAN) 0.01 % SOLN Place 1 drop into both eyes at bedtime.   LUMIGAN 0.01 % SOLN Place 1 drop into both eyes at bedtime.    No current facility-administered medications for this visit. (Ophthalmic Drugs)   Current Outpatient Medications (Other)  Medication Sig   acetaminophen (TYLENOL) 500 MG tablet Take 1,000 mg by mouth every 6 (six) hours as needed for moderate pain.   albuterol (VENTOLIN HFA) 108 (90 Base) MCG/ACT inhaler Inhale 2 puffs into the lungs every 6 (six) hours as needed for wheezi ng orshortness of breath.   calcium carbonate (OSCAL) 1500 (600 Ca) MG TABS tablet Take by mouth 2 (two) times daily with a meal.   cetirizine (ZYRTEC) 10 MG tablet 1 tablet Orally Once a day for 30 day(s)   Cholecalciferol (VITAMIN D3) 50 MCG  (2000 UT) capsule 1 tablet   Continuous Blood Gluc Receiver (FREESTYLE LIBRE 2 READER) DEVI Use to test blood sugar continuously. DX: E11.65   Continuous Glucose Sensor (FREESTYLE LIBRE 2 SENSOR) MISC USE TO TEST BLOOD SUGAR CONTINUOUSLY   denosumab (PROLIA) 60 MG/ML SOSY injection Inject 60 mg into the skin every 6 (six) months.   FARXIGA 10 MG TABS tablet TAKE ONE TABLET DAILY BEFORE BREAKFAST   fluconazole (DIFLUCAN) 150 MG tablet 1 po q week x 4 weeks   levothyroxine (SYNTHROID) 50 MCG tablet Take 1 tablet (50 mcg total) by mouth daily.   lisinopril (ZESTRIL) 5 MG tablet TAKE ONE TABLET DAILY FOR HIGH BLOOD PRESSURE   loperamide (IMODIUM A-D) 2 MG tablet Take 1 tablet (2 mg total) by mouth 4 (four) times daily as needed for diarrhea or loose stools.   montelukast (SINGULAIR) 10 MG tablet TAKE ONE TABLET ONCE DAILY   ondansetron (ZOFRAN) 4 MG tablet Take 1 tablet (4 mg total) by mouth every 8 (eight) hours as needed for nausea or vomiting.   pantoprazole (PROTONIX) 40 MG tablet TAKE ONE TABLET ONCE DAILY   phenylephrine (SUDAFED PE) 10 MG TABS tablet Take 10 mg by mouth every 4 (four) hours as needed (sinus congestion).   polyethylene glycol powder (GLYCOLAX/MIRALAX) 17 GM/SCOOP powder Take 17 g by mouth daily.   PROLIA 60 MG/ML SOSY injection Inject  60 mg into the skin every 6 (six) months.   rosuvastatin (CRESTOR) 20 MG tablet Take 1 tablet (20 mg total) by mouth daily.   tirzepatide [facility]) 5 MG/0.5ML Pen Inject 5 mg into the skin once a week.   TRELEGY ELLIPTA 200-62.5-25 MCG/ACT AEPB INHALE 1 PUFF ONCE DAILY   triamcinolone cream (KENALOG) 0.1 % Apply 1 application topically 2 (two) times daily as needed (dry skin).   vitamin B-12 (CYANOCOBALAMIN) 1000 MCG tablet Take 1,000 mcg by mouth daily.   Current Facility-Administered Medications (Other)  Medication Route   [START ON 03/15/2023] denosumab (PROLIA) injection 60 mg Subcutaneous   REVIEW OF SYSTEMS: ROS   Positive for:  Endocrine, Eyes, Psychiatric Negative for: Constitutional, Gastrointestinal, Neurological, Skin, Genitourinary, Musculoskeletal, HENT, Cardiovascular, Respiratory, Allergic/Imm, Heme/Lymph Last edited by Lana Fish, COT on 03/05/2023  8:58 AM.      ALLERGIES Allergies  Allergen Reactions   Ozempic (0.25 Or 0.5 Mg-Dose) [Semaglutide(0.25 Or 0.5mg -Dos)] Nausea And Vomiting    N/v, no energy   PAST MEDICAL HISTORY Past Medical History:  Diagnosis Date   Allergy    Asthma    Cancer (HCC)    cervical   Complication of anesthesia    " I woke and was short of breath."   CTS (carpal tunnel syndrome)    Diabetes mellitus without complication (HCC)    Early cataracts, bilateral    Fracture    trimalleolar ankle left   GERD (gastroesophageal reflux disease)    Glaucoma    Glaucoma    History of kidney stones    " I passed it"   Hyperlipidemia    Hypertension    Hypothyroidism    Neuropathy    Vitamin D deficiency    Wears glasses    Past Surgical History:  Procedure Laterality Date   ABDOMINAL HYSTERECTOMY     APPENDECTOMY     CARDIAC CATHETERIZATION  2006   negative   COLONOSCOPY  2009   negative   INNER EAR SURGERY Right 1981   left shoulder surgery Left 10/2009   ORIF ANKLE FRACTURE Left 09/05/2018   Procedure: OPEN REDUCTION INTERNAL FIXATION TRIMALLEOLAR FRACTURE POSSIBLE SYNDEMOSIS;  Surgeon: Terance Hart, MD;  Location: [facility] OR;  Service: Orthopedics;  Laterality: Left;   TONSILLECTOMY     TUBAL LIGATION     FAMILY HISTORY Family History  Problem Relation Age of Onset   Diabetes Mother    Prostate cancer Father    Heart disease Father    Heart attack Other    Breast cancer Neg Hx    SOCIAL HISTORY Social History   Tobacco Use   Smoking status: Former    Current packs/day: 0.00    Average packs/day: 1 pack/day for 15.0 years (15.0 ttl pk-yrs)    Types: Cigarettes    Start date: 06/06/1986    Quit date: 06/05/2001    Years since quitting: 21.7    Smokeless tobacco: Never  Vaping Use   Vaping status: Never Used  Substance Use Topics   Alcohol use: No   Drug use: No       OPHTHALMIC EXAM:  Base Eye Exam     Visual Acuity (Snellen - Linear)       Right Left   Dist [place] 20/25 -2 20/30 +2   Dist ph [place] NI 20/20-2         Tonometry (Tonopen, 10:32 AM)       Right Left   Pressure 11 13  Visual Fields (Counting fingers)       Left Right    Full Full         Extraocular Movement       Right Left    Full, Ortho Full, Ortho         Neuro/Psych     Oriented x3: Yes   Mood/Affect: Normal         Dilation     Both eyes: 1.0% Mydriacyl, 2.5% Phenylephrine @ 10:32 AM           Slit Lamp and Fundus Exam     Slit Lamp Exam       Right Left   Lids/Lashes Dermatochalasis - upper lid, Meibomian gland dysfunction Dermatochalasis - upper lid, Meibomian gland dysfunction, mild Telangiectasia   Conjunctiva/Sclera White and quiet White and quiet   Cornea Trace Punctate epithelial erosions, arcus, well healed temporal cataract wounds, tear film debris Trace Punctate epithelial erosions, arcus, well healed temporal cataract wounds, tear film debris   Anterior Chamber deep and clear deep and clear   Iris Round and dilated, No NVI Round and dilated, No NVI   Lens PC IOL in good position, 1+ Posterior capsular opacification PC IOL in good position, trace Posterior capsular opacification   Anterior Vitreous mild syneresis mild syneresis         Fundus Exam       Right Left   Disc Mild Pallor, Sharp rim, +cupping Mild Pallor, Sharp rim, temporal PPA   C/D Ratio 0.7 0.5   Macula Flat, Blunted foveal reflex, mild RPE mottling and clumping, scattered MA's -- greatest superior mac, focal cystic changes nasal fovea -- stably improved Flat, Blunted foveal reflex, mild RPE mottling and clumping, scattered MA/DBH -- focal cluster ST to fovea, central cystic changes -- improved   Vessels attenuated, mild  tortuosity attenuated, mild tortuosity   Periphery Attached, rare MA Attached, rare MA/DBH greatest nasal to disc           IMAGING AND PROCEDURES  Imaging and Procedures for 03/05/2023  OCT, Retina - OU - Both Eyes       Right Eye Quality was good. Central Foveal Thickness: 300. Progression has been stable. Findings include no IRF, no SRF, abnormal foveal contour, intraretinal hyper-reflective material, vitreomacular adhesion (Blunted foveal contour, stable improvement in cystic changes).   Left Eye Quality was good. Central Foveal Thickness: 303. Progression has improved. Findings include no IRF, no SRF, abnormal foveal contour, retinal drusen (Blunted foveal contour, persistent central cystic changes -- resolved, rare drusen).   Notes *Images captured and stored on drive  Diagnosis / Impression:  OD: Blunted foveal contour, stable improvement in cystic changes OS: Blunted foveal contour, persistent central cystic changes -- resolved, rare drusen  Clinical management:  See below  Abbreviations: NFP - Normal foveal profile. CME - cystoid macular edema. PED - pigment epithelial detachment. IRF - intraretinal fluid. SRF - subretinal fluid. EZ - ellipsoid zone. ERM - epiretinal membrane. ORA - outer retinal atrophy. ORT - outer retinal tubulation. SRHM - subretinal hyper-reflective material. IRHM - intraretinal hyper-reflective material            ASSESSMENT/PLAN:    ICD-10-CM   1. Moderate nonproliferative diabetic retinopathy of both eyes without macular edema associated with type 2 diabetes mellitus (HCC)  E11.3393 OCT, Retina - OU - Both Eyes    2. Long term (current) use of oral hypoglycemic drugs  Z79.84     3. Long-term (current) use of injectable non-insulin  antidiabetic drugs  Z79.85     4. Essential hypertension  I10     5. Hypertensive retinopathy of both eyes  H35.033     6. Pseudophakia, both eyes  Z96.1     7. Dry eyes  H04.123      1-3. Moderate  nonproliferative diabetic retinopathy, OU - Most recent A1C 5.4 on 11.22.24  - pt delayed to follow up from 3 months to 12 months (12.08.21-12.19.22) - FA 12.8.21 shows leaking MA, no NV OU  - BCVA today 20/25 OD, 20/20 OS - exam w/ scattered MA/DBH OU - OCT shows OD: Blunted foveal contour, stable improvement in cystic changes; OS: Blunted foveal contour, persistent central cystic changes -- resolved, rare drusen - discussed findings, prognosis, potential treatment options and importance of f/u - no retinal or ophthalmic interventions indicated or recommended today - f/u in 6-9 months, sooner prn -- DFE/OCT  4,5. Hypertensive retinopathy OU - discussed importance of tight BP control  - continue to monitor  6. Pseudophakia OU  - s/p CE/IOL (Dr. Elmer Picker; OS: 08.21, OD: 10.21)  - IOL in good position, doing well  - continue to monitor  7. Dry eyes OU - recommend artificial tears and lubricating ointment as needed  Ophthalmic Meds Ordered this visit:  No orders of the defined types were placed in this encounter.    Return for f/u 6-9 months, NPDR OU, DFE, OCT.  There are no Patient Instructions on file for this visit.   Explained the diagnoses, plan, and follow up with the patient and they expressed understanding.  Patient expressed understanding of the importance of proper follow up care.   This document serves as a record of services personally performed by Karie Chimera, MD, PhD. It was created on their behalf by Glee Arvin. Manson Passey, OA an ophthalmic technician. The creation of this record is the provider's dictation and/or activities during the visit.    Electronically signed by: Glee Arvin. Manson Passey, OA 03/06/23 11:16 PM   Karie Chimera, M.D., Ph.D. Diseases & Surgery of the Retina and Vitreous [facility]  I have reviewed the above documentation for accuracy and completeness, and I agree with the above. Karie Chimera, M.D., Ph.D. 03/06/23 11:18  PM   Abbreviations: M myopia (nearsighted); A astigmatism; H hyperopia (farsighted); P presbyopia; Mrx spectacle prescription;  CTL contact lenses; OD right eye; OS left eye; OU both eyes  XT exotropia; ET esotropia; PEK punctate epithelial keratitis; PEE punctate epithelial erosions; DES dry eye syndrome; MGD meibomian gland dysfunction; ATs artificial tears; PFAT's preservative free artificial tears; NSC nuclear sclerotic cataract; PSC posterior subcapsular cataract; ERM epi-retinal membrane; PVD posterior vitreous detachment; RD retinal detachment; DM diabetes mellitus; DR diabetic retinopathy; NPDR non-proliferative diabetic retinopathy; PDR proliferative diabetic retinopathy; CSME clinically significant macular edema; DME diabetic macular edema; dbh dot blot hemorrhages; CWS cotton wool spot; POAG primary open angle glaucoma; C/D cup-to-disc ratio; HVF humphrey visual field; GVF goldmann visual field; OCT optical coherence tomography; IOP intraocular pressure; BRVO Branch retinal vein occlusion; CRVO central retinal vein occlusion; CRAO central retinal artery occlusion; BRAO branch retinal artery occlusion; RT retinal tear; SB scleral buckle; PPV pars plana vitrectomy; VH Vitreous hemorrhage; PRP panretinal laser photocoagulation; IVK intravitreal kenalog; VMT vitreomacular traction; MH Macular hole;  NVD neovascularization of the disc; NVE neovascularization elsewhere; AREDS age related eye disease study; ARMD age related macular degeneration; POAG primary open angle glaucoma; EBMD epithelial/anterior basement membrane dystrophy; ACIOL anterior chamber intraocular lens; IOL intraocular  lens; PCIOL posterior chamber intraocular lens; Phaco/IOL phacoemulsification with intraocular lens placement; PRK photorefractive keratectomy; LASIK laser assisted in situ keratomileusis; HTN hypertension; DM diabetes mellitus; COPD chronic obstructive pulmonary disease

## 2023-02-22 NOTE — Patient Instructions (Signed)
Maria [name] , Thank you for taking time to come for your Medicare Wellness Visit. I appreciate your ongoing commitment to your health goals. Please review the following plan we discussed and let me know if I can assist you in the future.   Referrals/Orders/Follow-Ups/Clinician Recommendations: Aim for 30 minutes of exercise or brisk walking, 6-8 glasses of water, and 5 servings of fruits and vegetables each day.  This is a list of the screening recommended for you and due dates:  Health Maintenance  Topic Date Due   COVID-19 Vaccine (1) Never done   Colon Cancer Screening  10/07/2017   Eye exam for diabetics  12/16/2022   DTaP/Tdap/Td vaccine (2 - Td or Tdap) 06/26/2023   DEXA scan (bone density measurement)  06/30/2023   Mammogram  07/31/2023   Hemoglobin A1C  08/09/2023   Complete foot exam   11/09/2023   Yearly kidney function blood test for diabetes  02/09/2024   Yearly kidney health urinalysis for diabetes  02/09/2024   Medicare Annual Wellness Visit  02/22/2024   Pneumonia Vaccine  Completed   Flu Shot  Completed   Hepatitis C Screening  Completed   Zoster (Shingles) Vaccine  Completed   HPV Vaccine  Aged Out    Advanced directives: (ACP Link)Information on Advanced Care Planning can be found at [facility] Advance Health Care Directives Advance Health Care Directives ([email or web address]   Next Medicare Annual Wellness Visit scheduled for next year: Yes

## 2023-03-01 ENCOUNTER — Telehealth: Payer: Self-pay

## 2023-03-01 DIAGNOSIS — M810 Age-related osteoporosis without current pathological fracture: Secondary | ICD-10-CM

## 2023-03-01 MED ORDER — DENOSUMAB 60 MG/ML ~~LOC~~ SOSY
60.0000 mg | PREFILLED_SYRINGE | Freq: Once | SUBCUTANEOUS | Status: AC
Start: 2023-03-15 — End: 2023-03-12
  Administered 2023-03-12: 60 mg via SUBCUTANEOUS

## 2023-03-01 NOTE — Telephone Encounter (Signed)
Prolia ordered for PA team to review verification benefits

## 2023-03-05 ENCOUNTER — Ambulatory Visit (INDEPENDENT_AMBULATORY_CARE_PROVIDER_SITE_OTHER): Payer: Medicare Other | Admitting: Ophthalmology

## 2023-03-05 ENCOUNTER — Encounter (INDEPENDENT_AMBULATORY_CARE_PROVIDER_SITE_OTHER): Payer: Self-pay | Admitting: Ophthalmology

## 2023-03-05 DIAGNOSIS — Z7985 Long-term (current) use of injectable non-insulin antidiabetic drugs: Secondary | ICD-10-CM | POA: Diagnosis not present

## 2023-03-05 DIAGNOSIS — I10 Essential (primary) hypertension: Secondary | ICD-10-CM | POA: Diagnosis not present

## 2023-03-05 DIAGNOSIS — H04123 Dry eye syndrome of bilateral lacrimal glands: Secondary | ICD-10-CM | POA: Diagnosis not present

## 2023-03-05 DIAGNOSIS — Z961 Presence of intraocular lens: Secondary | ICD-10-CM

## 2023-03-05 DIAGNOSIS — E113393 Type 2 diabetes mellitus with moderate nonproliferative diabetic retinopathy without macular edema, bilateral: Secondary | ICD-10-CM

## 2023-03-05 DIAGNOSIS — H35033 Hypertensive retinopathy, bilateral: Secondary | ICD-10-CM | POA: Diagnosis not present

## 2023-03-05 DIAGNOSIS — Z7984 Long term (current) use of oral hypoglycemic drugs: Secondary | ICD-10-CM

## 2023-03-05 LAB — HM DIABETES EYE EXAM

## 2023-03-06 ENCOUNTER — Encounter (INDEPENDENT_AMBULATORY_CARE_PROVIDER_SITE_OTHER): Payer: Self-pay | Admitting: Ophthalmology

## 2023-03-12 ENCOUNTER — Ambulatory Visit: Payer: Medicare Other

## 2023-03-12 DIAGNOSIS — M810 Age-related osteoporosis without current pathological fracture: Secondary | ICD-10-CM

## 2023-03-12 NOTE — Progress Notes (Signed)
Patient came in for Prolia injection - given subcutaneous in left upper arm - patient tolerated well

## 2023-04-02 ENCOUNTER — Other Ambulatory Visit: Payer: Self-pay | Admitting: Family Medicine

## 2023-04-02 DIAGNOSIS — E1169 Type 2 diabetes mellitus with other specified complication: Secondary | ICD-10-CM

## 2023-05-16 ENCOUNTER — Encounter: Payer: Self-pay | Admitting: Family Medicine

## 2023-05-16 ENCOUNTER — Ambulatory Visit (INDEPENDENT_AMBULATORY_CARE_PROVIDER_SITE_OTHER): Payer: Medicare Other | Admitting: Family Medicine

## 2023-05-16 VITALS — BP 107/67 | HR 89 | Temp 97.3°F | Ht 62.0 in | Wt 95.2 lb

## 2023-05-16 DIAGNOSIS — I152 Hypertension secondary to endocrine disorders: Secondary | ICD-10-CM | POA: Diagnosis not present

## 2023-05-16 DIAGNOSIS — K219 Gastro-esophageal reflux disease without esophagitis: Secondary | ICD-10-CM | POA: Diagnosis not present

## 2023-05-16 DIAGNOSIS — E1142 Type 2 diabetes mellitus with diabetic polyneuropathy: Secondary | ICD-10-CM | POA: Diagnosis not present

## 2023-05-16 DIAGNOSIS — E1159 Type 2 diabetes mellitus with other circulatory complications: Secondary | ICD-10-CM | POA: Diagnosis not present

## 2023-05-16 DIAGNOSIS — Z794 Long term (current) use of insulin: Secondary | ICD-10-CM | POA: Diagnosis not present

## 2023-05-16 DIAGNOSIS — M810 Age-related osteoporosis without current pathological fracture: Secondary | ICD-10-CM | POA: Diagnosis not present

## 2023-05-16 DIAGNOSIS — E113393 Type 2 diabetes mellitus with moderate nonproliferative diabetic retinopathy without macular edema, bilateral: Secondary | ICD-10-CM | POA: Diagnosis not present

## 2023-05-16 DIAGNOSIS — E785 Hyperlipidemia, unspecified: Secondary | ICD-10-CM

## 2023-05-16 DIAGNOSIS — E1169 Type 2 diabetes mellitus with other specified complication: Secondary | ICD-10-CM

## 2023-05-16 DIAGNOSIS — E039 Hypothyroidism, unspecified: Secondary | ICD-10-CM

## 2023-05-16 DIAGNOSIS — E1143 Type 2 diabetes mellitus with diabetic autonomic (poly)neuropathy: Secondary | ICD-10-CM

## 2023-05-16 DIAGNOSIS — K3184 Gastroparesis: Secondary | ICD-10-CM

## 2023-05-16 LAB — BAYER DCA HB A1C WAIVED: HB A1C (BAYER DCA - WAIVED): 7.2 % — ABNORMAL HIGH (ref 4.8–5.6)

## 2023-05-16 NOTE — Patient Instructions (Addendum)

## 2023-05-16 NOTE — Progress Notes (Signed)
 Subjective:  Patient ID: Maria [name], female    DOB: 1955/09/12, 68 y.o.   MRN: [id number]  Patient Care Team: Sonny Masters, FNP as PCP - General (Family Medicine) Danella Maiers, [facility] as Pharmacist (Family Medicine) Mateo Flow, MD as Consulting Physician (Ophthalmology)   Chief Complaint:  Diabetes (3 month follow up)   HPI: Maria [name] is a 68 y.o. female presenting on 05/16/2023 for Diabetes (3 month follow up)   Discussed the use of AI scribe software for clinical note transcription with the patient, who gave verbal consent to proceed.  History of Present Illness   Maria [name] is a 68 year old female with diabetes who presents for follow-up of blood sugar control and related conditions.  She experiences fluctuations in blood sugar levels, with readings typically in the high 200s and occasionally reaching 300. She has not made any dietary changes and is currently on Mounjaro 5 mg and Farxiga 10 mg. No increased hunger, thirst, or urination beyond what is typical with [place] use.  Her retinopathy is stable, with a good report from her last eye clinic visit in December, and she is scheduled for follow-up in September. She has not experienced any changes in vision.  No issues with her blood pressure medications and no chest pain, leg swelling, or shortness of breath. However, she experienced chest discomfort last night and today, described as a burning sensation in the esophagus, possibly related to reflux. She took Protonix for this.  She is on Crestor for cholesterol management and reports no muscle aches or pains.  She receives Prolia every six months for osteoporosis and denies any jaw pain, teeth looseness, or recent fractures.  She takes Zofran for gastroparesis associated with diabetes, using it about three times a week due to nausea, but overall, she does not take it frequently.  She experiences neuropathy symptoms, including numbness, tingling, and  cramping in her feet and legs. The cramps occur in spells, sometimes waking her up at night, but the frequency varies.  Her thyroid function was checked today, and she reports no changes in hair, skin, nails, or bowel habits. She is unsure about her sleep quality.          Relevant past medical, surgical, family, and social history reviewed and updated as indicated.  Allergies and medications reviewed and updated. Data reviewed: Chart in Epic.   Past Medical History:  Diagnosis Date   Allergy    Asthma    Cancer (HCC)    cervical   Complication of anesthesia    " I woke and was short of breath."   CTS (carpal tunnel syndrome)    Diabetes mellitus without complication (HCC)    Early cataracts, bilateral    Fracture    trimalleolar ankle left   GERD (gastroesophageal reflux disease)    Glaucoma    Glaucoma    History of kidney stones    " I passed it"   Hyperlipidemia    Hypertension    Hypothyroidism    Neuropathy    Vitamin D deficiency    Wears glasses     Past Surgical History:  Procedure Laterality Date   ABDOMINAL HYSTERECTOMY     APPENDECTOMY     CARDIAC CATHETERIZATION  2006   negative   COLONOSCOPY  2009   negative   INNER EAR SURGERY Right 1981   left shoulder surgery Left 10/2009   ORIF ANKLE FRACTURE Left 09/05/2018   Procedure: OPEN REDUCTION  INTERNAL FIXATION TRIMALLEOLAR FRACTURE POSSIBLE SYNDEMOSIS;  Surgeon: Terance Hart, MD;  Location: [facility] OR;  Service: Orthopedics;  Laterality: Left;   TONSILLECTOMY     TUBAL LIGATION      Social History   Socioeconomic History   Marital status: Married    Spouse name: Loraine Leriche   Number of children: 2   Years of education: Not on file   Highest education level: Not on file  Occupational History   Not on file  Tobacco Use   Smoking status: Former    Current packs/day: 0.00    Average packs/day: 1 pack/day for 15.0 years (15.0 ttl pk-yrs)    Types: Cigarettes    Start date: 06/06/1986    Quit date:  06/05/2001    Years since quitting: 21.9   Smokeless tobacco: Never  Vaping Use   Vaping status: Never Used  Substance and Sexual Activity   Alcohol use: No   Drug use: No   Sexual activity: Not Currently  Other Topics Concern   Not on file  Social History Narrative   2 sons.   3 grandchildren.   Social Drivers of Corporate investment banker Strain: Low Risk  (02/22/2023)   Overall Financial Resource Strain (CARDIA)    Difficulty of Paying Living Expenses: Not hard at all  Food Insecurity: No Food Insecurity (02/22/2023)   Hunger Vital Sign    Worried About Running Out of Food in the Last Year: Never true    Ran Out of Food in the Last Year: Never true  Transportation Needs: No Transportation Needs (02/22/2023)   PRAPARE - Administrator, Civil Service (Medical): No    Lack of Transportation (Non-Medical): No  Physical Activity: Insufficiently Active (02/22/2023)   Exercise Vital Sign    Days of Exercise per Week: 3 days    Minutes of Exercise per Session: 30 min  Stress: No Stress Concern Present (02/22/2023)   [employer] of Occupational Health - Occupational Stress Questionnaire    Feeling of Stress : Not at all  Social Connections: Moderately Integrated (02/22/2023)   Social Connection and Isolation Panel [NHANES]    Frequency of Communication with Friends and Family: More than three times a week    Frequency of Social Gatherings with Friends and Family: Three times a week    Attends Religious Services: 1 to 4 times per year    Active Member of Clubs or Organizations: No    Attends Banker Meetings: Never    Marital Status: Married  Catering manager Violence: Not At Risk (02/22/2023)   Humiliation, Afraid, Rape, and Kick questionnaire    Fear of Current or Ex-Partner: No    Emotionally Abused: No    Physically Abused: No    Sexually Abused: No    Outpatient Encounter Medications as of 05/16/2023  Medication Sig   acetaminophen (TYLENOL)  500 MG tablet Take 1,000 mg by mouth every 6 (six) hours as needed for moderate pain.   albuterol (VENTOLIN HFA) 108 (90 Base) MCG/ACT inhaler Inhale 2 puffs into the lungs every 6 (six) hours as needed for wheezi ng orshortness of breath.   bimatoprost (LUMIGAN) 0.01 % SOLN Place 1 drop into both eyes at bedtime.   calcium carbonate (OSCAL) 1500 (600 Ca) MG TABS tablet Take by mouth 2 (two) times daily with a meal.   cetirizine (ZYRTEC) 10 MG tablet 1 tablet Orally Once a day for 30 day(s)   Cholecalciferol (VITAMIN D3) 50 MCG (2000 UT)  capsule 1 tablet   Continuous Blood Gluc Receiver (FREESTYLE LIBRE 2 READER) DEVI Use to test blood sugar continuously. DX: E11.65   Continuous Glucose Sensor (FREESTYLE LIBRE 2 SENSOR) MISC USE TO TEST BLOOD SUGAR CONTINUOUSLY   denosumab (PROLIA) 60 MG/ML SOSY injection Inject 60 mg into the skin every 6 (six) months.   FARXIGA 10 MG TABS tablet TAKE ONE TABLET DAILY BEFORE BREAKFAST   levothyroxine (SYNTHROID) 50 MCG tablet Take 1 tablet (50 mcg total) by mouth daily.   lisinopril (ZESTRIL) 5 MG tablet TAKE ONE TABLET DAILY FOR HIGH BLOOD PRESSURE   loperamide (IMODIUM A-D) 2 MG tablet Take 1 tablet (2 mg total) by mouth 4 (four) times daily as needed for diarrhea or loose stools.   LUMIGAN 0.01 % SOLN Place 1 drop into both eyes at bedtime.    montelukast (SINGULAIR) 10 MG tablet TAKE ONE TABLET ONCE DAILY   ondansetron (ZOFRAN) 4 MG tablet Take 1 tablet (4 mg total) by mouth every 8 (eight) hours as needed for nausea or vomiting.   pantoprazole (PROTONIX) 40 MG tablet TAKE ONE TABLET ONCE DAILY   phenylephrine (SUDAFED PE) 10 MG TABS tablet Take 10 mg by mouth every 4 (four) hours as needed (sinus congestion).   polyethylene glycol powder (GLYCOLAX/MIRALAX) 17 GM/SCOOP powder Take 17 g by mouth daily.   PROLIA 60 MG/ML SOSY injection Inject 60 mg into the skin every 6 (six) months.   rosuvastatin (CRESTOR) 20 MG tablet TAKE ONE TABLET DAILY   tirzepatide  (MOUNJARO) 5 MG/0.5ML Pen INJECT FIVE MG ONCE WEEKLY   TRELEGY ELLIPTA 200-62.5-25 MCG/ACT AEPB INHALE 1 PUFF ONCE DAILY   triamcinolone cream (KENALOG) 0.1 % Apply 1 application topically 2 (two) times daily as needed (dry skin).   vitamin B-12 (CYANOCOBALAMIN) 1000 MCG tablet Take 1,000 mcg by mouth daily.   [DISCONTINUED] fluconazole (DIFLUCAN) 150 MG tablet 1 po q week x 4 weeks   No facility-administered encounter medications on file as of 05/16/2023.    Allergies  Allergen Reactions   Ozempic (0.25 Or 0.5 Mg-Dose) [Semaglutide(0.25 Or 0.5mg -Dos)] Nausea And Vomiting    N/v, no energy    Pertinent ROS per HPI, otherwise unremarkable      Objective:  BP 107/67   Pulse 89   Temp (!) 97.3 F (36.3 C)   Ht 5\' 2"  (1.575 m)   Wt 95 lb 3.2 oz (43.2 kg)   SpO2 98%   BMI 17.41 kg/m    Wt Readings from Last 3 Encounters:  05/16/23 95 lb 3.2 oz (43.2 kg)  02/22/23 98 lb (44.5 kg)  02/09/23 98 lb 8 oz (44.7 kg)    Physical Exam Vitals and nursing note reviewed.  Constitutional:      General: She is not in acute distress.    Appearance: Normal appearance. She is well-developed and well-groomed. She is not ill-appearing, toxic-appearing or diaphoretic.  HENT:     Head: Normocephalic and atraumatic.     Jaw: There is normal jaw occlusion.     Right Ear: Hearing, tympanic membrane, ear canal and external ear normal.     Left Ear: Hearing, tympanic membrane, ear canal and external ear normal.     Nose: Nose normal.     Mouth/Throat:     Lips: Pink.     Mouth: Mucous membranes are moist.     Pharynx: Oropharynx is clear. Uvula midline.  Eyes:     General: Lids are normal.     Extraocular Movements: Extraocular movements intact.  Conjunctiva/sclera: Conjunctivae normal.     Pupils: Pupils are equal, round, and reactive to light.  Neck:     Thyroid: No thyroid mass, thyromegaly or thyroid tenderness.     Vascular: No carotid bruit or JVD.     Trachea: Trachea and  phonation normal.  Cardiovascular:     Rate and Rhythm: Normal rate and regular rhythm.     Chest Wall: PMI is not displaced.     Pulses: Normal pulses.     Heart sounds: Normal heart sounds. No murmur heard.    No friction rub. No gallop.  Pulmonary:     Effort: Pulmonary effort is normal. No respiratory distress.     Breath sounds: Normal breath sounds. No wheezing.  Abdominal:     General: Bowel sounds are normal.     Palpations: Abdomen is soft.  Musculoskeletal:        General: Normal range of motion.     Cervical back: Normal range of motion and neck supple.     Right lower leg: No edema.     Left lower leg: No edema.  Lymphadenopathy:     Cervical: No cervical adenopathy.  Skin:    General: Skin is warm and dry.     Capillary Refill: Capillary refill takes less than 2 seconds.     Coloration: Skin is not cyanotic, jaundiced or pale.     Findings: No rash.  Neurological:     General: No focal deficit present.     Mental Status: She is alert and oriented to person, place, and time.     Sensory: Sensation is intact.     Motor: Motor function is intact.     Coordination: Coordination is intact.     Gait: Gait is intact.     Deep Tendon Reflexes: Reflexes are normal and symmetric.  Psychiatric:        Attention and Perception: Attention and perception normal.        Mood and Affect: Mood and affect normal.        Speech: Speech normal.        Behavior: Behavior normal. Behavior is cooperative.        Thought Content: Thought content normal.        Cognition and Memory: Cognition and memory normal.        Judgment: Judgment normal.     Results for orders placed or performed in visit on 02/09/23  Microalbumin / creatinine urine ratio   Collection Time: 02/09/23  9:16 AM  Result Value Ref Range   Creatinine, Urine 65.0 Not Estab. mg/dL   Microalbumin, Urine 7.8 Not Estab. ug/mL   Microalb/Creat Ratio 12 0 - 29 mg/g creat  Bayer DCA Hb A1c Waived   Collection Time:  02/09/23  9:16 AM  Result Value Ref Range   HB A1C (BAYER DCA - WAIVED) 5.4 4.8 - 5.6 %  CBC with Differential/Platelet   Collection Time: 02/09/23  9:18 AM  Result Value Ref Range   WBC 5.1 3.4 - 10.8 x10E3/uL   RBC 4.57 3.77 - 5.28 x10E6/uL   Hemoglobin 14.1 11.1 - 15.9 g/dL   Hematocrit 40.9 81.1 - 46.6 %   MCV 93 79 - 97 fL   MCH 30.9 26.6 - 33.0 pg   MCHC 33.3 31.5 - 35.7 g/dL   RDW 91.4 78.2 - 95.6 %   Platelets 214 150 - 450 x10E3/uL   Neutrophils 64 Not Estab. %   Lymphs 23 Not Estab. %  Monocytes 10 Not Estab. %   Eos 2 Not Estab. %   Basos 1 Not Estab. %   Neutrophils Absolute 3.3 1.4 - 7.0 x10E3/uL   Lymphocytes Absolute 1.2 0.7 - 3.1 x10E3/uL   Monocytes Absolute 0.5 0.1 - 0.9 x10E3/uL   EOS (ABSOLUTE) 0.1 0.0 - 0.4 x10E3/uL   Basophils Absolute 0.0 0.0 - 0.2 x10E3/uL   Immature Granulocytes 0 Not Estab. %   Immature Grans (Abs) 0.0 0.0 - 0.1 x10E3/uL  CMP14+EGFR   Collection Time: 02/09/23  9:18 AM  Result Value Ref Range   Glucose 130 (H) 70 - 99 mg/dL   BUN 11 8 - 27 mg/dL   Creatinine, Ser 9.14 (L) 0.57 - 1.00 mg/dL   eGFR 782 >95 AO/ZHY/8.65   BUN/Creatinine Ratio 20 12 - 28   Sodium 140 134 - 144 mmol/L   Potassium 3.6 3.5 - 5.2 mmol/L   Chloride 104 96 - 106 mmol/L   CO2 22 20 - 29 mmol/L   Calcium 8.9 8.7 - 10.3 mg/dL   Total Protein 6.2 6.0 - 8.5 g/dL   Albumin 4.2 3.9 - 4.9 g/dL   Globulin, Total 2.0 1.5 - 4.5 g/dL   Bilirubin Total 1.2 0.0 - 1.2 mg/dL   Alkaline Phosphatase 67 44 - 121 IU/L   AST 17 0 - 40 IU/L   ALT 14 0 - 32 IU/L  Lipid panel   Collection Time: 02/09/23  9:18 AM  Result Value Ref Range   Cholesterol, Total 104 100 - 199 mg/dL   Triglycerides 784 0 - 149 mg/dL   HDL 46 >69 mg/dL   VLDL Cholesterol Cal 23 5 - 40 mg/dL   LDL Chol Calc (NIH) 35 0 - 99 mg/dL   Chol/HDL Ratio 2.3 0.0 - 4.4 ratio  Thyroid Panel With TSH   Collection Time: 02/09/23  9:18 AM  Result Value Ref Range   TSH 0.173 (L) 0.450 - 4.500 uIU/mL   T4,  Total 9.5 4.5 - 12.0 ug/dL   T3 Uptake Ratio 36 24 - 39 %   Free Thyroxine Index 3.4 1.2 - 4.9       Pertinent labs & imaging results that were available during my care of the patient were reviewed by me and considered in my medical decision making.  Assessment & Plan:  Daphyne was seen today for diabetes.  Diagnoses and all orders for this visit:  Type 2 diabetes mellitus with other specified complication, with long-term current use of insulin (HCC) -     Thyroid Panel With TSH -     Bayer DCA Hb A1c Waived -     CMP14+EGFR  Hypertension associated with type 2 diabetes mellitus (HCC) -     Thyroid Panel With TSH -     Bayer DCA Hb A1c Waived -     CMP14+EGFR  Hyperlipidemia associated with type 2 diabetes mellitus (HCC) -     CMP14+EGFR  Diabetic polyneuropathy associated with type 2 diabetes mellitus (HCC) -     Bayer DCA Hb A1c Waived -     CMP14+EGFR  Diabetic gastroparesis associated with type 2 diabetes mellitus (HCC) -     Bayer DCA Hb A1c Waived -     CMP14+EGFR  Moderate nonproliferative diabetic retinopathy of both eyes without macular edema associated with type 2 diabetes mellitus (HCC) -     Bayer DCA Hb A1c Waived  Age-related osteoporosis without current pathological fracture -     CMP14+EGFR  Acquired hypothyroidism -  Thyroid Panel With TSH  Gastroesophageal reflux disease without esophagitis -     Bayer DCA Hb A1c Waived     Assessment and Plan    Type 2 Diabetes Mellitus Blood sugars fluctuating, readings in high 200s and occasional 300s. Current medications: Mounjaro 5 mg, Farxiga 10 mg. A1c is 7.2%, above target range of 6-7%. No significant side effects from medications. No dietary changes. Decision to avoid increasing Mounjaro due to weight loss concerns. Discussed monitoring dietary triggers and rechecking A1c in 3 months. If A1c remains above 7%, consider medication adjustment other than increasing Mounjaro. - Monitor blood sugars and  dietary triggers - Recheck A1c in 3 months - Consider medication adjustment if A1c remains above 7%  Diabetic Retinopathy Condition well-managed. Last eye exam in December was good. Next follow-up in September. - Continue regular eye exams every 6 months - Request records from the eye clinic  Diabetic Gastroparesis Takes Zofran as needed, approximately three times a week for nausea. Condition well-managed. - Continue Zofran as needed  Diabetic Neuropathy Intermittent numbness, tingling, and cramping in feet and legs. Symptoms sometimes wake her at night. - Monitor symptoms - Report any worsening of symptoms  Hypertension No issues with blood pressure medications. No chest pain, leg swelling, or shortness of breath, except for transient chest discomfort likely related to reflux. - Continue current blood pressure medications  Hyperlipidemia On Crestor with no reported muscle aches or pains. - Continue Crestor  Osteoporosis On Prolia every six months with no reported jaw pain or teeth issues. No recent fractures. - Continue Prolia every six months  Hypothyroidism Thyroid function checked today. No changes in hair, skin, nails, or bowel habits. - Monitor thyroid function  Gastroesophageal Reflux Disease (GERD) Intermittent chest pain described as burning in the esophagus, likely related to reflux. No associated symptoms like sweating, nausea, vomiting, or radiating pain. Taking Protonix. - Continue Protonix - Monitor for any worsening symptoms  Follow-up - Recheck A1c in 3 months - Eye exam follow-up in September - Routine follow-up in 3 months.          Continue all other maintenance medications.  Follow up plan: Return in about 3 months (around 08/13/2023) for DM.   Continue healthy lifestyle choices, including diet (rich in fruits, vegetables, and lean proteins, and low in salt and simple carbohydrates) and exercise (at least 30 minutes of moderate physical  activity daily).  Educational handout given for DM  The above assessment and management plan was discussed with the patient. The patient verbalized understanding of and has agreed to the management plan. Patient is aware to call the clinic if they develop any new symptoms or if symptoms persist or worsen. Patient is aware when to return to the clinic for a follow-up visit. Patient educated on when it is appropriate to go to the emergency department.   Kari Baars, FNP-C [facility] [phone]

## 2023-05-17 LAB — CMP14+EGFR
ALT: 13 IU/L (ref 0–32)
AST: 17 IU/L (ref 0–40)
Albumin: 4.4 g/dL (ref 3.9–4.9)
Alkaline Phosphatase: 90 IU/L (ref 44–121)
BUN/Creatinine Ratio: 21 (ref 12–28)
BUN: 14 mg/dL (ref 8–27)
Bilirubin Total: 1.2 mg/dL (ref 0.0–1.2)
CO2: 21 mmol/L (ref 20–29)
Calcium: 9 mg/dL (ref 8.7–10.3)
Chloride: 104 mmol/L (ref 96–106)
Creatinine, Ser: 0.67 mg/dL (ref 0.57–1.00)
Globulin, Total: 1.9 g/dL (ref 1.5–4.5)
Glucose: 215 mg/dL — ABNORMAL HIGH (ref 70–99)
Potassium: 4.2 mmol/L (ref 3.5–5.2)
Sodium: 139 mmol/L (ref 134–144)
Total Protein: 6.3 g/dL (ref 6.0–8.5)
eGFR: 96 mL/min/1.73 (ref >59)

## 2023-05-17 LAB — THYROID PANEL WITH TSH
Free Thyroxine Index: 2.2 (ref 1.2–4.9)
T3 Uptake Ratio: 29 % (ref 24–39)
T4, Total: 7.6 ug/dL (ref 4.5–12.0)
TSH: 6.94 u[IU]/mL — ABNORMAL HIGH (ref 0.450–4.500)

## 2023-05-18 ENCOUNTER — Ambulatory Visit: Payer: Self-pay | Admitting: Family Medicine

## 2023-05-18 NOTE — Telephone Encounter (Signed)
 Chief Complaint: Shortness of breath/COVID positive,  Symptoms: shortness of breath, congestion, tired Frequency: Wednesday Pertinent Negatives: Patient denies CP Disposition: [] ED /[] Urgent Care (no appt availability in office) / [] Appointment(In office/virtual)/ []  [facility]/ [] Home Care/ [x] Refused Recommended Disposition /[] [facility]/ []  Follow-up with PCP Additional Notes: patient called with concerns for testing positive for COVID and moderate shortness of breath. Patient also endorses congestion and being tired. Per protocol, patient is recommended to the ED based on symptoms. Patient refused recommendation. Patient asked if provider would be able to send medication in for her. Patient verbalized understanding and all questions answered. CAL notified of patient refusal of ED.     Copied from CRM [id number]. Topic: Clinical - Red Word Triage >> May 18, 2023  3:52 PM Maree Krabbe H wrote: Red Word that prompted transfer to Nurse Triage: Patient states that her son went to the doctors office on 05/17/2023 for couching and other things, and they did a covid test but he hasn't gotten the results, the patient has been feeling sick since Wednesday and she did a at home covid test and tested positive for covid, she has shortness of breath, having to use inhaler more, and wheezing. Patient states her chest was hurting the other day but no longer has any chest pain. Reason for Disposition  MODERATE difficulty breathing (e.g., speaks in phrases, SOB even at rest, pulse 100-120)  [1] MODERATE difficulty breathing (e.g., speaks in phrases, SOB even at rest, pulse 100-120) AND [2] NEW-onset or WORSE than normal  Answer Assessment - Initial Assessment Questions 1. RESPIRATORY STATUS: "Describe your breathing?" (e.g., wheezing, shortness of breath, unable to speak, severe coughing)      Short of breath 2. ONSET: "When did this breathing problem begin?"      Started Wednesday  night 3. PATTERN "Does the difficult breathing come and go, or has it been constant since it started?"      Comes and goes 4. SEVERITY: "How bad is your breathing?" (e.g., mild, moderate, severe)    - MILD: No SOB at rest, mild SOB with walking, speaks normally in sentences, can lie down, no retractions, pulse < 100.    - MODERATE: SOB at rest, SOB with minimal exertion and prefers to sit, cannot lie down flat, speaks in phrases, mild retractions, audible wheezing, pulse 100-120.    - SEVERE: Very SOB at rest, speaks in single words, struggling to breathe, sitting hunched forward, retractions, pulse > 120      Moderate 5. RECURRENT SYMPTOM: "Have you had difficulty breathing before?" If Yes, ask: "When was the last time?" and "What happened that time?"      No 6. CARDIAC HISTORY: "Do you have any history of heart disease?" (e.g., heart attack, angina, bypass surgery, angioplasty)      No 7. LUNG HISTORY: "Do you have any history of lung disease?"  (e.g., pulmonary embolus, asthma, emphysema)     asthma 8. CAUSE: "What do you think is causing the breathing problem?"      COVID positive 9. OTHER SYMPTOMS: "Do you have any other symptoms? (e.g., dizziness, runny nose, cough, chest pain, fever)     Head congestion, no energy and no appetite 10. O2 SATURATION MONITOR:  "Do you use an oxygen saturation monitor (pulse oximeter) at home?" If Yes, ask: "What is your reading (oxygen level) today?" "What is your usual oxygen saturation reading?" (e.g., 95%)       N/A 12. TRAVEL: "Have you traveled out of the  country in the last month?" (e.g., travel history, exposures)       No  Answer Assessment - Initial Assessment Questions 1. COVID-19 DIAGNOSIS: "How do you know that you have COVID?" (e.g., positive lab test or self-test, diagnosed by doctor or NP/PA, symptoms after exposure).     Positive home test 2. COVID-19 EXPOSURE: "Was there any known exposure to COVID before the symptoms began?" CDC  Definition of close contact: within 6 feet (2 meters) for a total of 15 minutes or more over a 24-hour period.      Son with similar symptoms and tested positive 3. ONSET: "When did the COVID-19 symptoms start?"      Wednesday night 4. WORST SYMPTOM: "What is your worst symptom?" (e.g., cough, fever, shortness of breath, muscle aches)     Breathing, tired 5. COUGH: "Do you have a cough?" If Yes, ask: "How bad is the cough?"       Yes-comes and goes 6. FEVER: "Do you have a fever?" If Yes, ask: "What is your temperature, how was it measured, and when did it start?"     No 7. RESPIRATORY STATUS: "Describe your breathing?" (e.g., normal; shortness of breath, wheezing, unable to speak)      Short of breath 8. BETTER-SAME-WORSE: "Are you getting better, staying the same or getting worse compared to yesterday?"  If getting worse, ask, "In what way?"     same 9. OTHER SYMPTOMS: "Do you have any other symptoms?"  (e.g., chills, fatigue, headache, loss of smell or taste, muscle pain, sore throat)     Head congestion 10. HIGH RISK DISEASE: "Do you have any chronic medical problems?" (e.g., asthma, heart or lung disease, weak immune system, obesity, etc.)       asthma 11. VACCINE: "Have you had the COVID-19 vaccine?" If Yes, ask: "Which one, how many shots, when did you get it?"       No 13. O2 SATURATION MONITOR:  "Do you use an oxygen saturation monitor (pulse oximeter) at home?" If Yes, ask "What is your reading (oxygen level) today?" "What is your usual oxygen saturation reading?" (e.g., 95%)       N/A  Protocols used: Breathing Difficulty-A-AH, Coronavirus (COVID-19) Diagnosed or Suspected-A-AH

## 2023-05-18 NOTE — Telephone Encounter (Signed)
 Patient does not want to go to ED or urgent care.  Financial constraint.

## 2023-05-21 ENCOUNTER — Encounter: Payer: Self-pay | Admitting: Family Medicine

## 2023-06-22 ENCOUNTER — Other Ambulatory Visit: Payer: Self-pay | Admitting: Family Medicine

## 2023-06-22 DIAGNOSIS — Z1231 Encounter for screening mammogram for malignant neoplasm of breast: Secondary | ICD-10-CM

## 2023-06-25 ENCOUNTER — Other Ambulatory Visit: Payer: Self-pay | Admitting: Family Medicine

## 2023-06-25 DIAGNOSIS — J4540 Moderate persistent asthma, uncomplicated: Secondary | ICD-10-CM

## 2023-06-25 DIAGNOSIS — K219 Gastro-esophageal reflux disease without esophagitis: Secondary | ICD-10-CM

## 2023-06-25 DIAGNOSIS — E1169 Type 2 diabetes mellitus with other specified complication: Secondary | ICD-10-CM

## 2023-07-02 ENCOUNTER — Other Ambulatory Visit: Payer: Self-pay | Admitting: Family Medicine

## 2023-07-02 DIAGNOSIS — E1169 Type 2 diabetes mellitus with other specified complication: Secondary | ICD-10-CM

## 2023-07-23 DIAGNOSIS — H401132 Primary open-angle glaucoma, bilateral, moderate stage: Secondary | ICD-10-CM | POA: Diagnosis not present

## 2023-07-27 ENCOUNTER — Other Ambulatory Visit: Payer: Self-pay | Admitting: Family Medicine

## 2023-07-27 DIAGNOSIS — E1169 Type 2 diabetes mellitus with other specified complication: Secondary | ICD-10-CM

## 2023-08-07 ENCOUNTER — Telehealth: Payer: Self-pay

## 2023-08-07 ENCOUNTER — Other Ambulatory Visit (HOSPITAL_COMMUNITY): Payer: Self-pay

## 2023-08-07 DIAGNOSIS — M810 Age-related osteoporosis without current pathological fracture: Secondary | ICD-10-CM

## 2023-08-07 MED ORDER — DENOSUMAB 60 MG/ML ~~LOC~~ SOSY
60.0000 mg | PREFILLED_SYRINGE | Freq: Once | SUBCUTANEOUS | Status: AC
Start: 2023-09-11 — End: 2023-09-11
  Administered 2023-09-11: 60 mg via SUBCUTANEOUS

## 2023-08-07 NOTE — Telephone Encounter (Signed)
 Prolia  VOB initiated via [email or web address]  Next Prolia  inj DUE: 09/10/23   PHARMACY BENEFIT: $0

## 2023-08-07 NOTE — Telephone Encounter (Signed)
 Prolia  sent for benefit verification

## 2023-08-08 ENCOUNTER — Other Ambulatory Visit (HOSPITAL_COMMUNITY): Payer: Self-pay

## 2023-08-08 NOTE — Telephone Encounter (Signed)
 Maria [name]

## 2023-08-08 NOTE — Telephone Encounter (Signed)
 Pt ready for scheduling for PROLIA  on or after : 09/10/23  Option# 1: Buy/Bill (Office supplied medication)  Out-of-pocket cost due at time of clinic visit: $362  Number of injection/visits approved: 2  Primary: [employer]-MEDICARE Prolia  co-insurance: 20% Admin fee co-insurance: $30  Secondary: --- Prolia  co-insurance:  Admin fee co-insurance:   Medical Benefit Details: Date Benefits were checked: 5/20/5 Deductible: NO/ Coinsurance: 20%/ Admin Fee: $30  Prior Auth: APPROVED PA# [id number] Expiration Date: 09/10/23-09/09/24  # of doses approved: 2 ----------------------------------------------------------------------- Option# 2- Med Obtained from pharmacy:  Pharmacy benefit: Copay $0 (Paid to pharmacy) Admin Fee: $30 (Pay at clinic)  Prior Auth: N/A PA# Expiration Date:   # of doses approved:   If patient wants fill through the pharmacy benefit please send prescription to: [facility], and include estimated need by date in rx notes. Pharmacy will ship medication directly to the office.  Patient NOT eligible for Prolia  Copay Card. Copay Card can make patient's cost as little as $25. Link to apply: [email or web address]  ** This summary of benefits is an estimation of the patient's out-of-pocket cost. Exact cost may very based on individual plan coverage.

## 2023-08-09 ENCOUNTER — Other Ambulatory Visit: Payer: Self-pay

## 2023-08-09 MED ORDER — DENOSUMAB 60 MG/ML ~~LOC~~ SOSY: 60.0000 mg | PREFILLED_SYRINGE | SUBCUTANEOUS | 1 refills | Status: DC

## 2023-08-15 ENCOUNTER — Ambulatory Visit
Admission: RE | Admit: 2023-08-15 | Discharge: 2023-08-15 | Disposition: A | Discharge status: Home / Self Care | Source: Ambulatory Visit | Attending: Family Medicine | Admitting: Family Medicine

## 2023-08-15 ENCOUNTER — Other Ambulatory Visit: Payer: Self-pay

## 2023-08-15 DIAGNOSIS — Z1231 Encounter for screening mammogram for malignant neoplasm of breast: Secondary | ICD-10-CM

## 2023-08-17 ENCOUNTER — Ambulatory Visit: Payer: Medicare Other | Admitting: Family Medicine

## 2023-08-21 ENCOUNTER — Ambulatory Visit: Payer: Self-pay | Admitting: Family Medicine

## 2023-08-22 ENCOUNTER — Ambulatory Visit: Payer: Self-pay | Admitting: Family Medicine

## 2023-08-22 ENCOUNTER — Encounter: Payer: Self-pay | Admitting: Family Medicine

## 2023-08-22 ENCOUNTER — Ambulatory Visit: Admitting: Family Medicine

## 2023-08-22 VITALS — BP 97/58 | HR 93 | Temp 97.3°F | Ht 62.0 in | Wt 92.2 lb

## 2023-08-22 DIAGNOSIS — K219 Gastro-esophageal reflux disease without esophagitis: Secondary | ICD-10-CM

## 2023-08-22 DIAGNOSIS — E1159 Type 2 diabetes mellitus with other circulatory complications: Secondary | ICD-10-CM

## 2023-08-22 DIAGNOSIS — E113311 Type 2 diabetes mellitus with moderate nonproliferative diabetic retinopathy with macular edema, right eye: Secondary | ICD-10-CM

## 2023-08-22 DIAGNOSIS — E1169 Type 2 diabetes mellitus with other specified complication: Secondary | ICD-10-CM | POA: Diagnosis not present

## 2023-08-22 DIAGNOSIS — E785 Hyperlipidemia, unspecified: Secondary | ICD-10-CM

## 2023-08-22 DIAGNOSIS — K5900 Constipation, unspecified: Secondary | ICD-10-CM | POA: Diagnosis not present

## 2023-08-22 DIAGNOSIS — E039 Hypothyroidism, unspecified: Secondary | ICD-10-CM | POA: Diagnosis not present

## 2023-08-22 DIAGNOSIS — I152 Hypertension secondary to endocrine disorders: Secondary | ICD-10-CM

## 2023-08-22 DIAGNOSIS — J4540 Moderate persistent asthma, uncomplicated: Secondary | ICD-10-CM

## 2023-08-22 DIAGNOSIS — Z794 Long term (current) use of insulin: Secondary | ICD-10-CM

## 2023-08-22 DIAGNOSIS — M810 Age-related osteoporosis without current pathological fracture: Secondary | ICD-10-CM

## 2023-08-22 LAB — BAYER DCA HB A1C WAIVED: HB A1C (BAYER DCA - WAIVED): 6.9 % — ABNORMAL HIGH (ref 4.8–5.6)

## 2023-08-22 MED ORDER — PANTOPRAZOLE SODIUM 40 MG PO TBEC: 40.0000 mg | DELAYED_RELEASE_TABLET | Freq: Every day | ORAL | 1 refills | Status: DC

## 2023-08-22 MED ORDER — LISINOPRIL 5 MG PO TABS: 5.0000 mg | ORAL_TABLET | Freq: Every day | ORAL | 1 refills | Status: DC

## 2023-08-22 MED ORDER — MONTELUKAST SODIUM 10 MG PO TABS: 10.0000 mg | ORAL_TABLET | Freq: Every day | ORAL | 1 refills | Status: DC

## 2023-08-22 MED ORDER — DAPAGLIFLOZIN PROPANEDIOL 10 MG PO TABS: 10.0000 mg | ORAL_TABLET | Freq: Every day | ORAL | 1 refills | Status: DC

## 2023-08-22 NOTE — Progress Notes (Signed)
 Subjective:  Patient ID: Maria [name], female    DOB: Feb 05, 1956, 69 y.o.   MRN: [id number]  Patient Care Team: Galvin Jules, FNP as PCP - General (Family Medicine) Delilah Fend, [facility] as Pharmacist (Family Medicine) Amedeo Jupiter, MD as Consulting Physician (Ophthalmology)   Chief Complaint:  Diabetes (3 month follow up )   HPI: Maria [name] is a 68 y.o. female presenting on 08/22/2023 for Diabetes (3 month follow up )  Maria [name] is a 68 year old female with diabetes who presents for diabetes management and medication review.  She has been experiencing consistently high blood sugar levels, typically ranging from 200 to 250 mg/dL, despite regular monitoring. Her current diabetes medications include Farxiga  10 mg and Mounjaro  5 mg. She is concerned about the affordability of Mounjaro  due to recent changes in her medication assistance program, which may affect her ability to continue this medication after her current supply runs out in two weeks.  Her dietary habits include a late breakfast, supper, and occasional snacks a couple of hours before bed, often consisting of chips and a little pop. She rarely consumes sweets, preferring salty snacks like peanut butter crackers.  She is currently taking Singulair  for asthma and allergies, which she finds effective. She experiences normal allergy symptoms but no significant asthma exacerbations. She does not use her Trelegy maintenance medication regularly due to cost concerns.  She continues to take lisinopril  for blood pressure management without any reported side effects such as cough, chest pain, swelling, or headaches.  Regarding her gastrointestinal health, she takes pantoprazole  for reflux and reports occasional rectal aching and pressure lasting 10-15 minutes, which she describes as similar to a toothache. No dark tarry stools, bright red blood, or changes in stool consistency. She uses Miralax  two to three times a week to  manage her lifelong irregular bowel movements.  She has regular eye exams for diabetic retinopathy, with her next appointment scheduled for September. No changes in her vision.          Relevant past medical, surgical, family, and social history reviewed and updated as indicated.  Allergies and medications reviewed and updated. Data reviewed: Chart in Epic.   Past Medical History:  Diagnosis Date   Allergy    Asthma    Cancer (HCC)    cervical   Complication of anesthesia    " I woke and was short of breath."   CTS (carpal tunnel syndrome)    Diabetes mellitus without complication (HCC)    Early cataracts, bilateral    Fracture    trimalleolar ankle left   GERD (gastroesophageal reflux disease)    Glaucoma    Glaucoma    History of kidney stones    " I passed it"   Hyperlipidemia    Hypertension    Hypothyroidism    Neuropathy    Vitamin D  deficiency    Wears glasses     Past Surgical History:  Procedure Laterality Date   ABDOMINAL HYSTERECTOMY     APPENDECTOMY     CARDIAC CATHETERIZATION  2006   negative   COLONOSCOPY  2009   negative   INNER EAR SURGERY Right 1981   left shoulder surgery Left 10/2009   ORIF ANKLE FRACTURE Left 09/05/2018   Procedure: OPEN REDUCTION INTERNAL FIXATION TRIMALLEOLAR FRACTURE POSSIBLE SYNDEMOSIS;  Surgeon: Donnamarie Gables, MD;  Location: [facility] OR;  Service: Orthopedics;  Laterality: Left;   TONSILLECTOMY     TUBAL LIGATION  Social History   Socioeconomic History   Marital status: Married    Spouse name: Lavonia Powers   Number of children: 2   Years of education: Not on file   Highest education level: Not on file  Occupational History   Not on file  Tobacco Use   Smoking status: Former    Current packs/day: 0.00    Average packs/day: 1 pack/day for 15.0 years (15.0 ttl pk-yrs)    Types: Cigarettes    Start date: 06/06/1986    Quit date: 06/05/2001    Years since quitting: 22.2   Smokeless tobacco: Never  Vaping Use    Vaping status: Never Used  Substance and Sexual Activity   Alcohol use: No   Drug use: No   Sexual activity: Not Currently  Other Topics Concern   Not on file  Social History Narrative   2 sons.   3 grandchildren.   Social Drivers of Corporate investment banker Strain: Low Risk  (02/22/2023)   Overall Financial Resource Strain (CARDIA)    Difficulty of Paying Living Expenses: Not hard at all  Food Insecurity: No Food Insecurity (02/22/2023)   Hunger Vital Sign    Worried About Running Out of Food in the Last Year: Never true    Ran Out of Food in the Last Year: Never true  Transportation Needs: No Transportation Needs (02/22/2023)   PRAPARE - Administrator, Civil Service (Medical): No    Lack of Transportation (Non-Medical): No  Physical Activity: Insufficiently Active (02/22/2023)   Exercise Vital Sign    Days of Exercise per Week: 3 days    Minutes of Exercise per Session: 30 min  Stress: No Stress Concern Present (02/22/2023)   [employer] of Occupational Health - Occupational Stress Questionnaire    Feeling of Stress : Not at all  Social Connections: Moderately Integrated (02/22/2023)   Social Connection and Isolation Panel [NHANES]    Frequency of Communication with Friends and Family: More than three times a week    Frequency of Social Gatherings with Friends and Family: Three times a week    Attends Religious Services: 1 to 4 times per year    Active Member of Clubs or Organizations: No    Attends Banker Meetings: Never    Marital Status: Married  Catering manager Violence: Not At Risk (02/22/2023)   Humiliation, Afraid, Rape, and Kick questionnaire    Fear of Current or Ex-Partner: No    Emotionally Abused: No    Physically Abused: No    Sexually Abused: No    Outpatient Encounter Medications as of 08/22/2023  Medication Sig   acetaminophen  (TYLENOL ) 500 MG tablet Take 1,000 mg by mouth every 6 (six) hours as needed for moderate pain.    albuterol  (VENTOLIN  HFA) 108 (90 Base) MCG/ACT inhaler Inhale 2 puffs into the lungs every 6 (six) hours as needed for wheezi ng orshortness of breath.   bimatoprost (LUMIGAN) 0.01 % SOLN Place 1 drop into both eyes at bedtime.   calcium  carbonate (OSCAL) 1500 (600 Ca) MG TABS tablet Take by mouth 2 (two) times daily with a meal.   cetirizine (ZYRTEC) 10 MG tablet 1 tablet Orally Once a day for 30 day(s)   Cholecalciferol (VITAMIN D3) 50 MCG (2000 UT) capsule 1 tablet   Continuous Blood Gluc Receiver (FREESTYLE LIBRE 2 READER) DEVI Use to test blood sugar continuously. DX: E11.65   Continuous Glucose Sensor (FREESTYLE LIBRE 2 SENSOR) MISC USE TO TEST BLOOD  SUGAR CONTINUOUSLY   denosumab  (PROLIA ) 60 MG/ML SOSY injection Inject 60 mg into the skin every 6 (six) months.   levothyroxine  (SYNTHROID ) 50 MCG tablet Take 1 tablet (50 mcg total) by mouth daily.   loperamide  (IMODIUM  A-D) 2 MG tablet Take 1 tablet (2 mg total) by mouth 4 (four) times daily as needed for diarrhea or loose stools.   LUMIGAN 0.01 % SOLN Place 1 drop into both eyes at bedtime.    MOUNJARO  5 MG/0.5ML Pen INJECT FIVE MG ONCE WEEKLY   ondansetron  (ZOFRAN ) 4 MG tablet Take 1 tablet (4 mg total) by mouth every 8 (eight) hours as needed for nausea or vomiting.   phenylephrine  (SUDAFED PE) 10 MG TABS tablet Take 10 mg by mouth every 4 (four) hours as needed (sinus congestion).   polyethylene glycol powder (GLYCOLAX /MIRALAX ) 17 GM/SCOOP powder Take 17 g by mouth daily.   PROLIA  60 MG/ML SOSY injection Inject 60 mg into the skin every 6 (six) months.   rosuvastatin  (CRESTOR ) 20 MG tablet TAKE ONE TABLET DAILY   TRELEGY ELLIPTA 200-62.5-25 MCG/ACT AEPB INHALE 1 PUFF ONCE DAILY   triamcinolone  cream (KENALOG ) 0.1 % Apply 1 application topically 2 (two) times daily as needed (dry skin).   vitamin B-12 (CYANOCOBALAMIN ) 1000 MCG tablet Take 1,000 mcg by mouth daily.   dapagliflozin  propanediol (FARXIGA ) 10 MG TABS tablet Take 1 tablet (10  mg total) by mouth daily.   lisinopril  (ZESTRIL ) 5 MG tablet Take 1 tablet (5 mg total) by mouth daily. for high blood pressure   montelukast  (SINGULAIR ) 10 MG tablet Take 1 tablet (10 mg total) by mouth daily.   pantoprazole  (PROTONIX ) 40 MG tablet Take 1 tablet (40 mg total) by mouth daily.   [DISCONTINUED] FARXIGA  10 MG TABS tablet TAKE ONE TABLET DAILY BEFORE BREAKFAST   [DISCONTINUED] lisinopril  (ZESTRIL ) 5 MG tablet TAKE ONE TABLET DAILY FOR HIGH BLOOD PRESSURE   [DISCONTINUED] montelukast  (SINGULAIR ) 10 MG tablet TAKE ONE TABLET ONCE DAILY   [DISCONTINUED] pantoprazole  (PROTONIX ) 40 MG tablet TAKE ONE TABLET ONCE DAILY   Facility-Administered Encounter Medications as of 08/22/2023  Medication   [START ON 09/11/2023] denosumab  (PROLIA ) injection 60 mg    Allergies  Allergen Reactions   Ozempic  (0.25 Or 0.5 Mg-Dose) [Semaglutide (0.25 Or 0.5mg -Dos)] Nausea And Vomiting    N/v, no energy    Pertinent ROS per HPI, otherwise unremarkable      Objective:  BP (!) 97/58   Pulse 93   Temp (!) 97.3 F (36.3 C)   Ht 5\' 2"  (1.575 m)   Wt 92 lb 3.2 oz (41.8 kg)   SpO2 99%   BMI 16.86 kg/m    Wt Readings from Last 3 Encounters:  08/22/23 92 lb 3.2 oz (41.8 kg)  05/16/23 95 lb 3.2 oz (43.2 kg)  02/22/23 98 lb (44.5 kg)    Physical Exam Vitals and nursing note reviewed.  Constitutional:      General: She is not in acute distress.    Appearance: Normal appearance. She is normal weight. She is not ill-appearing, toxic-appearing or diaphoretic.  HENT:     Head: Normocephalic and atraumatic.     Nose: Nose normal.     Mouth/Throat:     Mouth: Mucous membranes are moist.     Pharynx: Oropharynx is clear.  Eyes:     Pupils: Pupils are equal, round, and reactive to light.  Cardiovascular:     Rate and Rhythm: Normal rate and regular rhythm.     Heart sounds: Normal  heart sounds.  Pulmonary:     Effort: Pulmonary effort is normal.     Breath sounds: Normal breath sounds.   Musculoskeletal:     Cervical back: Neck supple.     Right lower leg: No edema.     Left lower leg: No edema.  Skin:    General: Skin is warm and dry.     Capillary Refill: Capillary refill takes less than 2 seconds.  Neurological:     General: No focal deficit present.     Mental Status: She is alert and oriented to person, place, and time.     Cranial Nerves: No cranial nerve deficit.     Motor: No weakness.     Gait: Gait normal.  Psychiatric:        Mood and Affect: Mood normal.        Behavior: Behavior normal.        Thought Content: Thought content normal.        Judgment: Judgment normal.      Results for orders placed or performed in visit on 08/22/23  Bayer DCA Hb A1c Waived   Collection Time: 08/22/23  8:29 AM  Result Value Ref Range   HB A1C (BAYER DCA - WAIVED) 6.9 (H) 4.8 - 5.6 %       Pertinent labs & imaging results that were available during my care of the patient were reviewed by me and considered in my medical decision making.  Assessment & Plan:  Charmane was seen today for diabetes.  Diagnoses and all orders for this visit:  Type 2 diabetes mellitus with other specified complication, with long-term current use of insulin  (HCC) -     Bayer DCA Hb A1c Waived -     Thyroid  Panel With TSH -     CMP14+EGFR -     dapagliflozin  propanediol (FARXIGA ) 10 MG TABS tablet; Take 1 tablet (10 mg total) by mouth daily.  Moderate persistent chronic asthma without complication -     montelukast  (SINGULAIR ) 10 MG tablet; Take 1 tablet (10 mg total) by mouth daily.  Gastroesophageal reflux disease without esophagitis -     pantoprazole  (PROTONIX ) 40 MG tablet; Take 1 tablet (40 mg total) by mouth daily.  Acquired hypothyroidism -     Thyroid  Panel With TSH  Hypertension associated with type 2 diabetes mellitus (HCC) -     Bayer DCA Hb A1c Waived -     Thyroid  Panel With TSH -     CMP14+EGFR -     lisinopril  (ZESTRIL ) 5 MG tablet; Take 1 tablet (5 mg total) by  mouth daily. for high blood pressure  Hyperlipidemia associated with type 2 diabetes mellitus (HCC) -     Bayer DCA Hb A1c Waived -     CMP14+EGFR  Age-related osteoporosis without current pathological fracture -     CMP14+EGFR -     DG [facility] DEXA; Future  Moderate nonproliferative diabetic retinopathy of right eye with macular edema associated with type 2 diabetes mellitus (HCC) -     Bayer DCA Hb A1c Waived  Infrequent bowel movements -     Ambulatory referral to Gastroenterology       Type 2 Diabetes Mellitus Blood glucose levels are elevated, typically 200-250 mg/dL, despite current medication regimen. A1c is at goal at 6.9%, indicating adequate baseline glucose control, but postprandial spikes may contribute to elevated readings. Currently on Farxiga  10 mg and Mounjaro  5 mg. Concern about affordability of Mounjaro  due to changes in  medication assistance program, which may affect ability to continue this medication. If Mounjaro  becomes unaffordable, alternative diabetes medications will be considered. - Check A1c today - Advise monitoring blood glucose 3-4 hours postprandially - Discuss alternative diabetes medications if Mounjaro  becomes unaffordable - Schedule follow-up in 3 months for diabetes management  Asthma Reports well-controlled asthma symptoms with no recent exacerbations. On Singulair  for asthma and allergies, which is effective. Not consistently using Trelegy inhaler, intended as a maintenance medication to prevent exacerbations and hospitalizations. Cost is a barrier to consistent use of Trelegy and albuterol . - Educate on importance of daily use of Trelegy as a maintenance medication - Discuss cost concerns with pharmacy to explore options for affordable asthma medications  Hypertension On lisinopril  with no issues such as cough, chest pain, swelling, or headaches, indicating well-controlled blood pressure.  Gastroesophageal Reflux Disease (GERD) On pantoprazole   (Protonix ) for GERD. Reports occasional rectal aching and pressure, possibly related to bowel habits rather than GERD. No melena or significant changes in bowel habits reported. - Refer to GI for evaluation of rectal symptoms and discussion of colonoscopy due to overdue status  Constipation Experiences lifelong irregular bowel movements and uses Miralax  2-3 times a week, which helps. Reports occasional rectal aching and pressure, possibly related to constipation. - Advise considering daily use of Miralax  to improve bowel regularity  General Health Maintenance Due for a DEXA scan today. Regular ophthalmology visits for diabetic retinopathy, with the next appointment scheduled for September. - Perform DEXA scan today           Continue all other maintenance medications.  Follow up plan: Return in about 3 months (around 11/22/2023), or if symptoms worsen or fail to improve, for DM.   Continue healthy lifestyle choices, including diet (rich in fruits, vegetables, and lean proteins, and low in salt and simple carbohydrates) and exercise (at least 30 minutes of moderate physical activity daily).  Educational handout given for DM  The above assessment and management plan was discussed with the patient. The patient verbalized understanding of and has agreed to the management plan. Patient is aware to call the clinic if they develop any new symptoms or if symptoms persist or worsen. Patient is aware when to return to the clinic for a follow-up visit. Patient educated on when it is appropriate to go to the emergency department.   Kattie Parrot, FNP-C [facility] [phone]

## 2023-08-22 NOTE — Patient Instructions (Addendum)

## 2023-08-23 ENCOUNTER — Telehealth: Payer: Self-pay

## 2023-08-23 LAB — THYROID PANEL WITH TSH
Free Thyroxine Index: 2.3 (ref 1.2–4.9)
T3 Uptake Ratio: 29 % (ref 24–39)
T4, Total: 7.9 ug/dL (ref 4.5–12.0)
TSH: 6.28 u[IU]/mL — ABNORMAL HIGH (ref 0.450–4.500)

## 2023-08-23 LAB — CMP14+EGFR
ALT: 18 IU/L (ref 0–32)
AST: 22 IU/L (ref 0–40)
Albumin: 4.3 g/dL (ref 3.9–4.9)
Alkaline Phosphatase: 108 IU/L (ref 44–121)
BUN/Creatinine Ratio: 15 (ref 12–28)
BUN: 12 mg/dL (ref 8–27)
Bilirubin Total: 1.2 mg/dL (ref 0.0–1.2)
CO2: 22 mmol/L (ref 20–29)
Calcium: 9.3 mg/dL (ref 8.7–10.3)
Chloride: 102 mmol/L (ref 96–106)
Creatinine, Ser: 0.79 mg/dL (ref 0.57–1.00)
Globulin, Total: 2.1 g/dL (ref 1.5–4.5)
Glucose: 189 mg/dL — ABNORMAL HIGH (ref 70–99)
Potassium: 4.3 mmol/L (ref 3.5–5.2)
Sodium: 138 mmol/L (ref 134–144)
Total Protein: 6.4 g/dL (ref 6.0–8.5)
eGFR: 82 mL/min/{1.73_m2} (ref >59)

## 2023-08-23 NOTE — Telephone Encounter (Signed)
 Copied from CRM [id number]. Topic: Clinical - Request for Lab/Test Order >> Aug 23, 2023  9:22 AM Sasha H wrote: Reason for CRM: Pt is still waiting to be scheduled for Bone Density test

## 2023-08-23 NOTE — Telephone Encounter (Signed)
 Called and schedule appt for 08/28/2023 for 930am

## 2023-08-28 ENCOUNTER — Other Ambulatory Visit

## 2023-09-03 ENCOUNTER — Ambulatory Visit (INDEPENDENT_AMBULATORY_CARE_PROVIDER_SITE_OTHER)

## 2023-09-03 DIAGNOSIS — M810 Age-related osteoporosis without current pathological fracture: Secondary | ICD-10-CM | POA: Diagnosis not present

## 2023-09-03 DIAGNOSIS — Z780 Asymptomatic menopausal state: Secondary | ICD-10-CM | POA: Diagnosis not present

## 2023-09-07 ENCOUNTER — Telehealth: Payer: Self-pay

## 2023-09-07 NOTE — Telephone Encounter (Signed)
 Copied from CRM [id number]. Topic: Appointments - Scheduling Inquiry for Clinic >> Sep 07, 2023 12:59 PM Sophia H wrote: Reason for CRM: Patient states she got a call from the pharmacy that her Prolia  shot will be in on Monday 06/23, she is wanting to know if she can stop by the office and have the shot administered. Please advise, # [phone]

## 2023-09-07 NOTE — Telephone Encounter (Signed)
 Called and made appt for 09/11/23 - Tuesday

## 2023-09-11 ENCOUNTER — Ambulatory Visit (INDEPENDENT_AMBULATORY_CARE_PROVIDER_SITE_OTHER)

## 2023-09-11 DIAGNOSIS — M810 Age-related osteoporosis without current pathological fracture: Secondary | ICD-10-CM

## 2023-09-11 NOTE — Progress Notes (Signed)
 Patient is in office today for a nurse visit for  Prolia injection . Patient Injection was given in the  Left arm. Patient tolerated injection well.

## 2023-09-25 ENCOUNTER — Other Ambulatory Visit: Payer: Self-pay | Admitting: Family Medicine

## 2023-09-25 DIAGNOSIS — E1169 Type 2 diabetes mellitus with other specified complication: Secondary | ICD-10-CM

## 2023-11-15 NOTE — Progress Notes (Signed)
 [facility] - Clinic Note  11/27/2023     CHIEF COMPLAINT Patient presents for Retina Follow Up    HISTORY OF PRESENT ILLNESS: Maria [name] is a 68 y.o. female who presents to the clinic today for:   HPI     Retina Follow Up   Patient presents with  Wet AMD.  In left eye.  This started 9 months ago.  Duration of 9 months.  Since onset it is stable.  I, the attending physician,  performed the HPI with the patient and updated documentation appropriately.        Comments   9 month retina follow up NPDR pt is reporting no vision changes noticed she denies any flashes or floaters her last reading 190 few days ago 7 A1C       Last edited by Valdemar Rogue, MD on 12/03/2023  1:37 AM.    Patient states vision is stable, has not had any issues.   Referring physician: Lamarr Burkitt, MD [address] [place], [place] [address]  HISTORICAL INFORMATION:   Selected notes from the MEDICAL RECORD NUMBER Referred by Dr. Burkitt LEE: 01/28/2020 BCVA: 20/20 OU NPDR OU, PCIOL OU, possible CME OS, POAG OU    CURRENT MEDICATIONS: Current Outpatient Medications (Ophthalmic Drugs)  Medication Sig   bimatoprost (LUMIGAN) 0.01 % SOLN Place 1 drop into both eyes at bedtime.   LUMIGAN 0.01 % SOLN Place 1 drop into both eyes at bedtime.    No current facility-administered medications for this visit. (Ophthalmic Drugs)   Current Outpatient Medications (Other)  Medication Sig   acetaminophen  (TYLENOL ) 500 MG tablet Take 1,000 mg by mouth every 6 (six) hours as needed for moderate pain.   albuterol  (VENTOLIN  HFA) 108 (90 Base) MCG/ACT inhaler Inhale 2 puffs into the lungs every 6 (six) hours as needed for wheezi ng orshortness of breath.   calcium  carbonate (OSCAL) 1500 (600 Ca) MG TABS tablet Take by mouth 2 (two) times daily with a meal.   cetirizine (ZYRTEC) 10 MG tablet 1 tablet Orally Once a day for 30 day(s)   Cholecalciferol (VITAMIN D3) 50 MCG (2000 UT)  capsule 1 tablet   Continuous Blood Gluc Receiver (FREESTYLE LIBRE 2 READER) DEVI Use to test blood sugar continuously. DX: E11.65   Continuous Glucose Sensor (FREESTYLE LIBRE 2 SENSOR) MISC USE TO TEST BLOOD SUGAR CONTINUOUSLY   dapagliflozin  propanediol (FARXIGA ) 10 MG TABS tablet Take 1 tablet (10 mg total) by mouth daily.   denosumab  (PROLIA ) 60 MG/ML SOSY injection Inject 60 mg into the skin every 6 (six) months.   levothyroxine  (SYNTHROID ) 50 MCG tablet Take 1 tablet (50 mcg total) by mouth daily.   lisinopril  (ZESTRIL ) 5 MG tablet Take 1 tablet (5 mg total) by mouth daily. for high blood pressure   loperamide  (IMODIUM  A-D) 2 MG tablet Take 1 tablet (2 mg total) by mouth 4 (four) times daily as needed for diarrhea or loose stools.   montelukast  (SINGULAIR ) 10 MG tablet Take 1 tablet (10 mg total) by mouth daily.   pantoprazole  (PROTONIX ) 40 MG tablet Take 1 tablet (40 mg total) by mouth daily.   phenylephrine  (SUDAFED PE) 10 MG TABS tablet Take 10 mg by mouth every 4 (four) hours as needed (sinus congestion).   polyethylene glycol powder (GLYCOLAX /MIRALAX ) 17 GM/SCOOP powder Take 17 g by mouth daily.   PROLIA  60 MG/ML SOSY injection Inject 60 mg into the skin every 6 (six) months.   TRELEGY ELLIPTA 200-62.5-25 MCG/ACT  AEPB INHALE 1 PUFF ONCE DAILY   triamcinolone  cream (KENALOG ) 0.1 % Apply 1 application topically 2 (two) times daily as needed (dry skin).   vitamin B-12 (CYANOCOBALAMIN ) 1000 MCG tablet Take 1,000 mcg by mouth daily.   ondansetron  (ZOFRAN ) 4 MG tablet Take 1 tablet (4 mg total) by mouth every 8 (eight) hours as needed for nausea or vomiting.   rosuvastatin  (CRESTOR ) 20 MG tablet Take 1 tablet (20 mg total) by mouth daily.   tirzepatide  (MOUNJARO ) 5 MG/0.5ML Pen Inject 5 mg into the skin once a week.   No current facility-administered medications for this visit. (Other)   REVIEW OF SYSTEMS: ROS   Positive for: Endocrine, Eyes, Psychiatric Negative for: Constitutional,  Gastrointestinal, Neurological, Skin, Genitourinary, Musculoskeletal, HENT, Cardiovascular, Respiratory, Allergic/Imm, Heme/Lymph Last edited by Resa Delon ORN, COT on 11/27/2023  8:33 AM.       ALLERGIES Allergies  Allergen Reactions   Ozempic  (0.25 Or 0.5 Mg-Dose) [Semaglutide (0.25 Or 0.5mg -Dos)] Nausea And Vomiting    N/v, no energy   PAST MEDICAL HISTORY Past Medical History:  Diagnosis Date   Allergy    Asthma    Cancer (HCC)    cervical   Complication of anesthesia     I woke and was short of breath.   CTS (carpal tunnel syndrome)    Diabetes mellitus without complication (HCC)    Early cataracts, bilateral    Fracture    trimalleolar ankle left   GERD (gastroesophageal reflux disease)    Glaucoma    Glaucoma    History of kidney stones     I passed it   Hyperlipidemia    Hypertension    Hypothyroidism    Neuropathy    Vitamin D  deficiency    Wears glasses    Past Surgical History:  Procedure Laterality Date   ABDOMINAL HYSTERECTOMY     APPENDECTOMY     CARDIAC CATHETERIZATION  2006   negative   COLONOSCOPY  2009   negative   INNER EAR SURGERY Right 1981   left shoulder surgery Left 10/2009   ORIF ANKLE FRACTURE Left 09/05/2018   Procedure: OPEN REDUCTION INTERNAL FIXATION TRIMALLEOLAR FRACTURE POSSIBLE SYNDEMOSIS;  Surgeon: Elsa Lonni SAUNDERS, MD;  Location: [facility] OR;  Service: Orthopedics;  Laterality: Left;   TONSILLECTOMY     TUBAL LIGATION     FAMILY HISTORY Family History  Problem Relation Age of Onset   Diabetes Mother    Prostate cancer Father    Heart disease Father    Heart attack Other    Breast cancer Neg Hx    SOCIAL HISTORY Social History   Tobacco Use   Smoking status: Former    Current packs/day: 0.00    Average packs/day: 1 pack/day for 15.0 years (15.0 ttl pk-yrs)    Types: Cigarettes    Start date: 06/06/1986    Quit date: 06/05/2001    Years since quitting: 22.5   Smokeless tobacco: Never  Vaping Use   Vaping  status: Never Used  Substance Use Topics   Alcohol use: No   Drug use: No       OPHTHALMIC EXAM:  Base Eye Exam     Visual Acuity (Snellen - Linear)       Right Left   Dist [place] 20/25 20/25 -2   Dist ph [place] NI NI         Tonometry (Tonopen, 8:46 AM)       Right Left   Pressure 13 13  Pupils       Pupils Dark Light Shape React APD   Right PERRL 4 3 Round Brisk None   Left PERRL 4 3 Round Brisk None         Visual Fields       Left Right    Full Full         Extraocular Movement       Right Left    Full, Ortho Full, Ortho         Neuro/Psych     Oriented x3: Yes   Mood/Affect: Normal         Dilation     Both eyes: 2.5% Phenylephrine  @ 8:46 AM           Slit Lamp and Fundus Exam     Slit Lamp Exam       Right Left   Lids/Lashes Dermatochalasis - upper lid, Meibomian gland dysfunction Dermatochalasis - upper lid, Meibomian gland dysfunction   Conjunctiva/Sclera White and quiet White and quiet   Cornea arcus, well healed temporal cataract wounds, tear film debris, trace PEE arcus, well healed temporal cataract wounds, tear film debris, 1-2+ inferior PEE   Anterior Chamber deep and clear deep and clear   Iris Round and dilated, No NVI Round and dilated, No NVI   Lens PC IOL in good position, 1+ Posterior capsular opacification PC IOL in good position, trace Posterior capsular opacification   Anterior Vitreous mild syneresis mild syneresis         Fundus Exam       Right Left   Disc Mild Pallor, Sharp rim, +cupping Mild Pallor, Sharp rim, temporal PPA   C/D Ratio 0.7 0.5   Macula Flat, Blunted foveal reflex, mild RPE mottling and clumping, scattered MA's-greatest centrally, focal cystic changes nasal fovea -- stably improved, no edema Flat, Blunted foveal reflex, mild RPE mottling and clumping, scattered MA/DBH -- focal cluster ST to fovea, central cystic changes -- stably improved, no edema   Vessels attenuated, mild  tortuosity attenuated, mild tortuosity   Periphery Attached, rare MA Attached, rare MA/DBH greatest nasal to disc           Refraction     Wearing Rx       Sphere Cylinder Axis Add   Right -0.25 +1.00 016 +2.75   Left -1.00 Sphere  +2.75           IMAGING AND PROCEDURES  Imaging and Procedures for 11/27/2023  OCT, Retina - OU - Both Eyes       Right Eye Quality was good. Central Foveal Thickness: 303. Progression has been stable. Findings include no IRF, no SRF, abnormal foveal contour, intraretinal hyper-reflective material, vitreomacular adhesion (Blunted foveal contour, no edema/cystic changes).   Left Eye Quality was good. Central Foveal Thickness: 307. Progression has been stable. Findings include no IRF, no SRF, abnormal foveal contour, retinal drusen (Blunted foveal contour, stable resolution of central cystic changes, rare drusen).   Notes *Images captured and stored on drive  Diagnosis / Impression:  OD: Blunted foveal contour, no edema/cystic changes OS: Blunted foveal contour, stable resolution of central cystic changes, rare drusen  Clinical management:  See below  Abbreviations: NFP - Normal foveal profile. CME - cystoid macular edema. PED - pigment epithelial detachment. IRF - intraretinal fluid. SRF - subretinal fluid. EZ - ellipsoid zone. ERM - epiretinal membrane. ORA - outer retinal atrophy. ORT - outer retinal tubulation. SRHM - subretinal hyper-reflective material. IRHM - intraretinal hyper-reflective material  ASSESSMENT/PLAN:    ICD-10-CM   1. Moderate nonproliferative diabetic retinopathy of both eyes without macular edema associated with type 2 diabetes mellitus (HCC)  E11.3393 OCT, Retina - OU - Both Eyes    2. Long term (current) use of oral hypoglycemic drugs  Z79.84     3. Long-term (current) use of injectable non-insulin  antidiabetic drugs  Z79.85     4. Essential hypertension  I10     5. Hypertensive retinopathy of  both eyes  H35.033     6. Pseudophakia, both eyes  Z96.1     7. Dry eyes  H04.123       1-3. Moderate nonproliferative diabetic retinopathy, OU - Most recent A1C 6.9 on 6.4.25, 5.4 on 11.22.24  - pt delayed to follow up from 3 months to 12 months (12.08.21-12.19.22) - FA 12.8.21 shows leaking MA, no NV OU  - BCVA today 20/25 OD, 20/25 OS - exam w/ scattered MA/DBH OU - OCT shows OD: Blunted foveal contour, no edema/cystic changes, OS: Blunted foveal contour, stable resolution of central cystic changes, rare drusen - discussed findings, prognosis, potential treatment options and importance of f/u - no retinal or ophthalmic interventions indicated or recommended today - f/u in 9 months, sooner prn -- DFE/OCT  4,5. Hypertensive retinopathy OU - discussed importance of tight BP control  - continue to monitor  6. Pseudophakia OU  - s/p CE/IOL (Dr. Cleatus; OS: 08.21, OD: 10.21)  - IOL in good position, doing well  - continue to monitor  7. Dry eyes OU - recommend artificial tears and lubricating ointment as needed  Ophthalmic Meds Ordered this visit:  No orders of the defined types were placed in this encounter.    Return in about 9 months (around 08/26/2024) for NPDR OU, DFE, OCT.  There are no Patient Instructions on file for this visit.   Explained the diagnoses, plan, and follow up with the patient and they expressed understanding.  Patient expressed understanding of the importance of proper follow up care.   This document serves as a record of services personally performed by Redell JUDITHANN Hans, MD, PhD. It was created on their behalf by Wanda GEANNIE Keens, COT an ophthalmic technician. The creation of this record is the provider's dictation and/or activities during the visit.    Electronically signed by:  Wanda GEANNIE Keens, COT  12/03/23 2:05 AM  Redell JUDITHANN Hans, M.D., Ph.D. Diseases & Surgery of the Retina and Vitreous [facility]  I have  reviewed the above documentation for accuracy and completeness, and I agree with the above. Redell JUDITHANN Hans, M.D., Ph.D. 12/03/23 2:05 AM   Abbreviations: M myopia (nearsighted); A astigmatism; H hyperopia (farsighted); P presbyopia; Mrx spectacle prescription;  CTL contact lenses; OD right eye; OS left eye; OU both eyes  XT exotropia; ET esotropia; PEK punctate epithelial keratitis; PEE punctate epithelial erosions; DES dry eye syndrome; MGD meibomian gland dysfunction; ATs artificial tears; PFAT's preservative free artificial tears; NSC nuclear sclerotic cataract; PSC posterior subcapsular cataract; ERM epi-retinal membrane; PVD posterior vitreous detachment; RD retinal detachment; DM diabetes mellitus; DR diabetic retinopathy; NPDR non-proliferative diabetic retinopathy; PDR proliferative diabetic retinopathy; CSME clinically significant macular edema; DME diabetic macular edema; dbh dot blot hemorrhages; CWS cotton wool spot; POAG primary open angle glaucoma; C/D cup-to-disc ratio; HVF humphrey visual field; GVF goldmann visual field; OCT optical coherence tomography; IOP intraocular pressure; BRVO Branch retinal vein occlusion; CRVO central retinal vein occlusion; CRAO central retinal artery occlusion; BRAO branch retinal  artery occlusion; RT retinal tear; SB scleral buckle; PPV pars plana vitrectomy; VH Vitreous hemorrhage; PRP panretinal laser photocoagulation; IVK intravitreal kenalog ; VMT vitreomacular traction; MH Macular hole;  NVD neovascularization of the disc; NVE neovascularization elsewhere; AREDS age related eye disease study; ARMD age related macular degeneration; POAG primary open angle glaucoma; EBMD epithelial/anterior basement membrane dystrophy; ACIOL anterior chamber intraocular lens; IOL intraocular lens; PCIOL posterior chamber intraocular lens; Phaco/IOL phacoemulsification with intraocular lens placement; PRK photorefractive keratectomy; LASIK laser assisted in situ keratomileusis; HTN  hypertension; DM diabetes mellitus; COPD chronic obstructive pulmonary disease

## 2023-11-27 ENCOUNTER — Ambulatory Visit (INDEPENDENT_AMBULATORY_CARE_PROVIDER_SITE_OTHER): Admitting: Ophthalmology

## 2023-11-27 ENCOUNTER — Encounter (INDEPENDENT_AMBULATORY_CARE_PROVIDER_SITE_OTHER): Payer: Self-pay | Admitting: Ophthalmology

## 2023-11-27 DIAGNOSIS — Z961 Presence of intraocular lens: Secondary | ICD-10-CM

## 2023-11-27 DIAGNOSIS — Z7984 Long term (current) use of oral hypoglycemic drugs: Secondary | ICD-10-CM

## 2023-11-27 DIAGNOSIS — Z7985 Long-term (current) use of injectable non-insulin antidiabetic drugs: Secondary | ICD-10-CM

## 2023-11-27 DIAGNOSIS — E113393 Type 2 diabetes mellitus with moderate nonproliferative diabetic retinopathy without macular edema, bilateral: Secondary | ICD-10-CM | POA: Diagnosis not present

## 2023-11-27 DIAGNOSIS — H35033 Hypertensive retinopathy, bilateral: Secondary | ICD-10-CM

## 2023-11-27 DIAGNOSIS — I10 Essential (primary) hypertension: Secondary | ICD-10-CM

## 2023-11-27 DIAGNOSIS — H04123 Dry eye syndrome of bilateral lacrimal glands: Secondary | ICD-10-CM | POA: Diagnosis not present

## 2023-11-28 ENCOUNTER — Other Ambulatory Visit (HOSPITAL_COMMUNITY): Payer: Self-pay

## 2023-11-28 ENCOUNTER — Telehealth: Payer: Self-pay

## 2023-11-28 NOTE — Telephone Encounter (Signed)
 Pharmacy Patient Advocate Encounter   Received notification from Onbase that prior authorization for FreeStyle Libre 2 Plus Sensor  is required/requested.   Insurance verification completed.   The patient is insured through [employer] .   Per test claim: PA required; PA submitted to above mentioned insurance via Latent Key/confirmation #/EOC [id number] Status is pending

## 2023-11-30 ENCOUNTER — Encounter: Payer: Self-pay | Admitting: Family Medicine

## 2023-11-30 ENCOUNTER — Ambulatory Visit (INDEPENDENT_AMBULATORY_CARE_PROVIDER_SITE_OTHER): Admitting: Family Medicine

## 2023-11-30 VITALS — BP 135/72 | HR 89 | Temp 97.8°F | Ht 62.0 in | Wt 95.8 lb

## 2023-11-30 DIAGNOSIS — E039 Hypothyroidism, unspecified: Secondary | ICD-10-CM | POA: Diagnosis not present

## 2023-11-30 DIAGNOSIS — E785 Hyperlipidemia, unspecified: Secondary | ICD-10-CM

## 2023-11-30 DIAGNOSIS — I152 Hypertension secondary to endocrine disorders: Secondary | ICD-10-CM

## 2023-11-30 DIAGNOSIS — E1169 Type 2 diabetes mellitus with other specified complication: Secondary | ICD-10-CM | POA: Diagnosis not present

## 2023-11-30 DIAGNOSIS — E559 Vitamin D deficiency, unspecified: Secondary | ICD-10-CM | POA: Diagnosis not present

## 2023-11-30 DIAGNOSIS — K3184 Gastroparesis: Secondary | ICD-10-CM | POA: Diagnosis not present

## 2023-11-30 DIAGNOSIS — E1143 Type 2 diabetes mellitus with diabetic autonomic (poly)neuropathy: Secondary | ICD-10-CM

## 2023-11-30 DIAGNOSIS — E1159 Type 2 diabetes mellitus with other circulatory complications: Secondary | ICD-10-CM

## 2023-11-30 DIAGNOSIS — E1142 Type 2 diabetes mellitus with diabetic polyneuropathy: Secondary | ICD-10-CM

## 2023-11-30 DIAGNOSIS — M810 Age-related osteoporosis without current pathological fracture: Secondary | ICD-10-CM

## 2023-11-30 DIAGNOSIS — E113311 Type 2 diabetes mellitus with moderate nonproliferative diabetic retinopathy with macular edema, right eye: Secondary | ICD-10-CM | POA: Diagnosis not present

## 2023-11-30 LAB — BAYER DCA HB A1C WAIVED: HB A1C (BAYER DCA - WAIVED): 7 % — ABNORMAL HIGH (ref 4.8–5.6)

## 2023-11-30 MED ORDER — ONDANSETRON HCL 4 MG PO TABS: 4.0000 mg | ORAL_TABLET | Freq: Three times a day (TID) | ORAL | 0 refills | Status: AC | PRN

## 2023-11-30 MED ORDER — MOUNJARO 5 MG/0.5ML ~~LOC~~ SOAJ: 5.0000 mg | SUBCUTANEOUS | 1 refills | Status: DC

## 2023-11-30 MED ORDER — ROSUVASTATIN CALCIUM 20 MG PO TABS
20.0000 mg | ORAL_TABLET | Freq: Every day | ORAL | 1 refills | Status: AC
Start: 2023-11-30 — End: ?

## 2023-11-30 NOTE — Progress Notes (Addendum)
 "    Subjective:  Patient ID: Maria [name], female    DOB: [birth year], 68 y.o.   MRN: [id number]  Patient Care Team: Severa Rock HERO, FNP as PCP - General (Family Medicine) Billee Mliss BIRCH, [facility] as Pharmacist (Family Medicine) Cleatus Collar, MD as Consulting Physician (Ophthalmology)   Chief Complaint:  Diabetes (3 month follow up )   HPI: Maria [name] is a 68 y.o. female presenting on 11/30/2023 for Diabetes (3 month follow up )   [name] is a 68 year old female with diabetes who presents for management of chronic medical conditions.  Blood sugar levels are consistently in the 200s daily after eating, with a drop after eating. She is unsure how long her blood sugar remains elevated postprandially. She has had 2-3 readings below 54 over the last several weeks. She is able to bring these up with a snack. She is currently on Mounjaro  5 mg weekly and Farxiga  10 mg daily. Her last A1c was 6.9%.  She is receiving Prolia  injections for osteoporosis and has encountered insurance issues regarding coverage for Prolia  and her [place] device. She mentions the possibility of returning to blood sticks if the Herlene is not covered.  She recently saw her eye doctor for diabetic retinopathy and reports that everything is stable with no changes. Her next appointment is in nine months.  She reports having neuropathy and is not currently using any specific treatments like Nervive.  She experiences episodes of nausea and vomiting, which can occur daily for a week or not at all for several weeks. She uses Zofran  for relief, which she finds effective.  No significant changes in her thyroid  condition, including no hair, skin, or nail changes, and no significant fatigue or weight loss. She continues to take calcium  and vitamin D  supplements.  She reports recent headaches, which she attributes to weather changes or allergies, as she has been sneezing frequently. No chest pain or leg swelling.           Relevant past medical, surgical, family, and social history reviewed and updated as indicated.  Allergies and medications reviewed and updated. Data reviewed: Chart in Epic.   Past Medical History:  Diagnosis Date   Allergy    Asthma    Cancer (HCC)    cervical   Complication of anesthesia     I woke and was short of breath.   CTS (carpal tunnel syndrome)    Diabetes mellitus without complication (HCC)    Early cataracts, bilateral    Fracture    trimalleolar ankle left   GERD (gastroesophageal reflux disease)    Glaucoma    Glaucoma    History of kidney stones     I passed it   Hyperlipidemia    Hypertension    Hypothyroidism    Neuropathy    Vitamin D  deficiency    Wears glasses     Past Surgical History:  Procedure Laterality Date   ABDOMINAL HYSTERECTOMY     APPENDECTOMY     CARDIAC CATHETERIZATION  2006   negative   COLONOSCOPY  2009   negative   INNER EAR SURGERY Right 1981   left shoulder surgery Left 10/2009   ORIF ANKLE FRACTURE Left 09/05/2018   Procedure: OPEN REDUCTION INTERNAL FIXATION TRIMALLEOLAR FRACTURE POSSIBLE SYNDEMOSIS;  Surgeon: Elsa Lonni SAUNDERS, MD;  Location: [facility];  Service: Orthopedics;  Laterality: Left;   TONSILLECTOMY     TUBAL LIGATION      Social History  Socioeconomic History   Marital status: Married    Spouse name: Oneil   Number of children: 2   Years of education: Not on file   Highest education level: Not on file  Occupational History   Not on file  Tobacco Use   Smoking status: Former    Current packs/day: 0.00    Average packs/day: 1 pack/day for 15.0 years (15.0 ttl pk-yrs)    Types: Cigarettes    Start date: 06/06/1986    Quit date: 06/05/2001    Years since quitting: 22.5   Smokeless tobacco: Never  Vaping Use   Vaping status: Never Used  Substance and Sexual Activity   Alcohol use: No   Drug use: No   Sexual activity: Not Currently  Other Topics Concern   Not on file  Social History  Narrative   2 sons.   3 grandchildren.   Social Drivers of Corporate Investment Banker Strain: Low Risk  (02/22/2023)   Overall Financial Resource Strain (CARDIA)    Difficulty of Paying Living Expenses: Not hard at all  Food Insecurity: No Food Insecurity (02/22/2023)   Hunger Vital Sign    Worried About Running Out of Food in the Last Year: Never true    Ran Out of Food in the Last Year: Never true  Transportation Needs: No Transportation Needs (02/22/2023)   PRAPARE - Administrator, Civil Service (Medical): No    Lack of Transportation (Non-Medical): No  Physical Activity: Insufficiently Active (02/22/2023)   Exercise Vital Sign    Days of Exercise per Week: 3 days    Minutes of Exercise per Session: 30 min  Stress: No Stress Concern Present (02/22/2023)   [employer] of Occupational Health - Occupational Stress Questionnaire    Feeling of Stress : Not at all  Social Connections: Moderately Integrated (02/22/2023)   Social Connection and Isolation Panel    Frequency of Communication with Friends and Family: More than three times a week    Frequency of Social Gatherings with Friends and Family: Three times a week    Attends Religious Services: 1 to 4 times per year    Active Member of Clubs or Organizations: No    Attends Banker Meetings: Never    Marital Status: Married  Catering Manager Violence: Not At Risk (02/22/2023)   Humiliation, Afraid, Rape, and Kick questionnaire    Fear of Current or Ex-Partner: No    Emotionally Abused: No    Physically Abused: No    Sexually Abused: No    Outpatient Encounter Medications as of 11/30/2023  Medication Sig   acetaminophen  (TYLENOL ) 500 MG tablet Take 1,000 mg by mouth every 6 (six) hours as needed for moderate pain.   albuterol  (VENTOLIN  HFA) 108 (90 Base) MCG/ACT inhaler Inhale 2 puffs into the lungs every 6 (six) hours as needed for wheezi ng orshortness of breath.   bimatoprost (LUMIGAN) 0.01 % SOLN  Place 1 drop into both eyes at bedtime.   calcium  carbonate (OSCAL) 1500 (600 Ca) MG TABS tablet Take by mouth 2 (two) times daily with a meal.   cetirizine (ZYRTEC) 10 MG tablet 1 tablet Orally Once a day for 30 day(s)   Cholecalciferol (VITAMIN D3) 50 MCG (2000 UT) capsule 1 tablet   Continuous Blood Gluc Receiver (FREESTYLE LIBRE 2 READER) DEVI Use to test blood sugar continuously. DX: E11.65   Continuous Glucose Sensor (FREESTYLE LIBRE 2 SENSOR) MISC USE TO TEST BLOOD SUGAR CONTINUOUSLY   dapagliflozin   propanediol (FARXIGA ) 10 MG TABS tablet Take 1 tablet (10 mg total) by mouth daily.   denosumab  (PROLIA ) 60 MG/ML SOSY injection Inject 60 mg into the skin every 6 (six) months.   levothyroxine  (SYNTHROID ) 50 MCG tablet Take 1 tablet (50 mcg total) by mouth daily.   lisinopril  (ZESTRIL ) 5 MG tablet Take 1 tablet (5 mg total) by mouth daily. for high blood pressure   loperamide  (IMODIUM  A-D) 2 MG tablet Take 1 tablet (2 mg total) by mouth 4 (four) times daily as needed for diarrhea or loose stools.   LUMIGAN 0.01 % SOLN Place 1 drop into both eyes at bedtime.    montelukast  (SINGULAIR ) 10 MG tablet Take 1 tablet (10 mg total) by mouth daily.   pantoprazole  (PROTONIX ) 40 MG tablet Take 1 tablet (40 mg total) by mouth daily.   phenylephrine  (SUDAFED PE) 10 MG TABS tablet Take 10 mg by mouth every 4 (four) hours as needed (sinus congestion).   polyethylene glycol powder (GLYCOLAX /MIRALAX ) 17 GM/SCOOP powder Take 17 g by mouth daily.   PROLIA  60 MG/ML SOSY injection Inject 60 mg into the skin every 6 (six) months.   TRELEGY ELLIPTA 200-62.5-25 MCG/ACT AEPB INHALE 1 PUFF ONCE DAILY   triamcinolone  cream (KENALOG ) 0.1 % Apply 1 application topically 2 (two) times daily as needed (dry skin).   vitamin B-12 (CYANOCOBALAMIN ) 1000 MCG tablet Take 1,000 mcg by mouth daily.   [DISCONTINUED] ondansetron  (ZOFRAN ) 4 MG tablet Take 1 tablet (4 mg total) by mouth every 8 (eight) hours as needed for nausea or  vomiting.   ondansetron  (ZOFRAN ) 4 MG tablet Take 1 tablet (4 mg total) by mouth every 8 (eight) hours as needed for nausea or vomiting.   rosuvastatin  (CRESTOR ) 20 MG tablet Take 1 tablet (20 mg total) by mouth daily.   tirzepatide  (MOUNJARO ) 5 MG/0.5ML Pen Inject 5 mg into the skin once a week.   [DISCONTINUED] MOUNJARO  5 MG/0.5ML Pen INJECT FIVE MG ONCE WEEKLY   [DISCONTINUED] rosuvastatin  (CRESTOR ) 20 MG tablet TAKE ONE TABLET DAILY   No facility-administered encounter medications on file as of 11/30/2023.    Allergies  Allergen Reactions   Ozempic  (0.25 Or 0.5 Mg-Dose) [Semaglutide (0.25 Or 0.5mg -Dos)] Nausea And Vomiting    N/v, no energy    Pertinent ROS per HPI, otherwise unremarkable      Objective:  BP 135/72   Pulse 89   Temp 97.8 F (36.6 C)   Ht 5' 2 (1.575 m)   Wt 95 lb 12.8 oz (43.5 kg)   SpO2 97%   BMI 17.52 kg/m    Wt Readings from Last 3 Encounters:  11/30/23 95 lb 12.8 oz (43.5 kg)  08/22/23 92 lb 3.2 oz (41.8 kg)  05/16/23 95 lb 3.2 oz (43.2 kg)    Physical Exam Vitals and nursing note reviewed.  Constitutional:      General: She is not in acute distress.    Appearance: Normal appearance. She is normal weight. She is not ill-appearing, toxic-appearing or diaphoretic.  HENT:     Head: Normocephalic and atraumatic.     Nose: Nose normal.     Mouth/Throat:     Mouth: Mucous membranes are moist.  Eyes:     Pupils: Pupils are equal, round, and reactive to light.  Cardiovascular:     Rate and Rhythm: Normal rate and regular rhythm.     Heart sounds: Normal heart sounds.  Pulmonary:     Effort: Pulmonary effort is normal.     Breath  sounds: Normal breath sounds.  Musculoskeletal:     Cervical back: Neck supple.     Right lower leg: No edema.     Left lower leg: No edema.  Skin:    General: Skin is warm and dry.     Capillary Refill: Capillary refill takes less than 2 seconds.  Neurological:     General: No focal deficit present.     Mental  Status: She is alert and oriented to person, place, and time.  Psychiatric:        Mood and Affect: Mood normal.        Behavior: Behavior normal.        Thought Content: Thought content normal.        Judgment: Judgment normal.      Results for orders placed or performed in visit on 08/22/23  Bayer DCA Hb A1c Waived   Collection Time: 08/22/23  8:29 AM  Result Value Ref Range   HB A1C (BAYER DCA - WAIVED) 6.9 (H) 4.8 - 5.6 %  Thyroid  Panel With TSH   Collection Time: 08/22/23  8:33 AM  Result Value Ref Range   TSH 6.280 (H) 0.450 - 4.500 uIU/mL   T4, Total 7.9 4.5 - 12.0 ug/dL   T3 Uptake Ratio 29 24 - 39 %   Free Thyroxine Index 2.3 1.2 - 4.9  CMP14+EGFR   Collection Time: 08/22/23  8:33 AM  Result Value Ref Range   Glucose 189 (H) 70 - 99 mg/dL   BUN 12 8 - 27 mg/dL   Creatinine, Ser 9.20 0.57 - 1.00 mg/dL   eGFR 82 >40 fO/fpw/8.26   BUN/Creatinine Ratio 15 12 - 28   Sodium 138 134 - 144 mmol/L   Potassium 4.3 3.5 - 5.2 mmol/L   Chloride 102 96 - 106 mmol/L   CO2 22 20 - 29 mmol/L   Calcium  9.3 8.7 - 10.3 mg/dL   Total Protein 6.4 6.0 - 8.5 g/dL   Albumin 4.3 3.9 - 4.9 g/dL   Globulin, Total 2.1 1.5 - 4.5 g/dL   Bilirubin Total 1.2 0.0 - 1.2 mg/dL   Alkaline Phosphatase 108 44 - 121 IU/L   AST 22 0 - 40 IU/L   ALT 18 0 - 32 IU/L       Pertinent labs & imaging results that were available during my care of the patient were reviewed by me and considered in my medical decision making.  Assessment & Plan:  Zarielle was seen today for diabetes.  Diagnoses and all orders for this visit:  Type 2 diabetes mellitus with other specified complication, without long-term current use of insulin  (HCC) -     Bayer DCA Hb A1c Waived -     Thyroid  Panel With TSH -     CMP14+EGFR -     tirzepatide  (MOUNJARO ) 5 MG/0.5ML Pen; Inject 5 mg into the skin once a week. -     Lipid panel  Hyperlipidemia associated with type 2 diabetes mellitus (HCC) -     Bayer DCA Hb A1c Waived -      CMP14+EGFR -     rosuvastatin  (CRESTOR ) 20 MG tablet; Take 1 tablet (20 mg total) by mouth daily. -     Lipid panel  Diabetic gastroparesis associated with type 2 diabetes mellitus (HCC) -     Bayer DCA Hb A1c Waived -     CMP14+EGFR -     ondansetron  (ZOFRAN ) 4 MG tablet; Take 1 tablet (4 mg total) by  mouth every 8 (eight) hours as needed for nausea or vomiting.  Hypertension associated with type 2 diabetes mellitus (HCC) -     Bayer DCA Hb A1c Waived -     Thyroid  Panel With TSH -     CMP14+EGFR -     Lipid panel  Diabetic polyneuropathy associated with type 2 diabetes mellitus (HCC) -     Bayer DCA Hb A1c Waived -     CMP14+EGFR  Moderate nonproliferative diabetic retinopathy of right eye with macular edema associated with type 2 diabetes mellitus (HCC) -     Bayer DCA Hb A1c Waived -     CMP14+EGFR  Acquired hypothyroidism -     Thyroid  Panel With TSH  Age-related osteoporosis without current pathological fracture -     CMP14+EGFR  Vitamin D  deficiency -     CMP14+EGFR -     VITAMIN D  25 Hydroxy (Vit-D Deficiency, Fractures)     Type 2 diabetes mellitus with diabetic neuropathy and diabetic retinopathy Blood glucose levels are consistently in the 200s postprandially but normalize within two hours. Low BS is managed with a snack. No significant hyperglycemia or hypoglycemic symptoms.  Last hemoglobin A1c is 6.9, within the target range of 6-7. Diabetic retinopathy is well-managed with regular ophthalmology follow-ups every nine months. Neuropathy is present, but no additional treatments are currently used. - Order hemoglobin A1c test - Continue Mounjaro  5 mg weekly and Farxiga  10 mg daily - Consider increasing Mounjaro  dose if A1c is elevated - Recommend Nervive for neuropathy - Ensure prior authorization for Libre and Prolia   Osteoporosis Receiving Prolia  injections for osteoporosis management. Insurance issues regarding Prolia  coverage are being addressed. - Continue  Prolia  injections - Ensure prior authorization for Prolia   Hypothyroidism Thyroid  function is stable with no significant symptoms such as hair, skin, or nail changes, fatigue, or weight loss.  Nausea and vomiting Intermittent episodes of nausea and vomiting lasting up to a week. Zofran  is effective in managing symptoms. - Refill Zofran  prescription          Continue all other maintenance medications.  Follow up plan: Return in about 3 months (around 02/29/2024) for DM.   Continue healthy lifestyle choices, including diet (rich in fruits, vegetables, and lean proteins, and low in salt and simple carbohydrates) and exercise (at least 30 minutes of moderate physical activity daily).  Educational handout given for DM  The above assessment and management plan was discussed with the patient. The patient verbalized understanding of and has agreed to the management plan. Patient is aware to call the clinic if they develop any new symptoms or if symptoms persist or worsen. Patient is aware when to return to the clinic for a follow-up visit. Patient educated on when it is appropriate to go to the emergency department.   Rosaline Bruns, FNP-C [facility] [phone]  "

## 2023-11-30 NOTE — Telephone Encounter (Signed)
Additional information has been requested from the patient's insurance in order to proceed with the prior authorization request. Requested information has been sent, or form has been filled out and faxed back to [phone]

## 2023-11-30 NOTE — Patient Instructions (Signed)

## 2023-12-01 ENCOUNTER — Ambulatory Visit: Payer: Self-pay | Admitting: Family Medicine

## 2023-12-01 LAB — CMP14+EGFR
ALT: 18 IU/L (ref 0–32)
AST: 20 IU/L (ref 0–40)
Albumin: 4.5 g/dL (ref 3.9–4.9)
Alkaline Phosphatase: 90 IU/L (ref 44–121)
BUN/Creatinine Ratio: 23 (ref 12–28)
BUN: 15 mg/dL (ref 8–27)
Bilirubin Total: 1.2 mg/dL (ref 0.0–1.2)
CO2: 22 mmol/L (ref 20–29)
Calcium: 9.1 mg/dL (ref 8.7–10.3)
Chloride: 103 mmol/L (ref 96–106)
Creatinine, Ser: 0.65 mg/dL (ref 0.57–1.00)
Globulin, Total: 2 g/dL (ref 1.5–4.5)
Glucose: 185 mg/dL — ABNORMAL HIGH (ref 70–99)
Potassium: 4.3 mmol/L (ref 3.5–5.2)
Sodium: 138 mmol/L (ref 134–144)
Total Protein: 6.5 g/dL (ref 6.0–8.5)
eGFR: 96 mL/min/1.73 (ref >59)

## 2023-12-01 LAB — THYROID PANEL WITH TSH
Free Thyroxine Index: 2.4 (ref 1.2–4.9)
T3 Uptake Ratio: 28 % (ref 24–39)
T4, Total: 8.7 ug/dL (ref 4.5–12.0)
TSH: 5.76 u[IU]/mL — ABNORMAL HIGH (ref 0.450–4.500)

## 2023-12-01 LAB — LIPID PANEL
Chol/HDL Ratio: 2.8 ratio (ref 0.0–4.4)
Cholesterol, Total: 155 mg/dL (ref 100–199)
HDL: 56 mg/dL (ref >39)
LDL Chol Calc (NIH): 79 mg/dL (ref 0–99)
Triglycerides: 113 mg/dL (ref 0–149)
VLDL Cholesterol Cal: 20 mg/dL (ref 5–40)

## 2023-12-01 LAB — VITAMIN D 25 HYDROXY (VIT D DEFICIENCY, FRACTURES): Vit D, 25-Hydroxy: 51.7 ng/mL (ref 30.0–100.0)

## 2023-12-03 ENCOUNTER — Encounter (INDEPENDENT_AMBULATORY_CARE_PROVIDER_SITE_OTHER): Payer: Self-pay | Admitting: Ophthalmology

## 2023-12-03 ENCOUNTER — Other Ambulatory Visit (HOSPITAL_COMMUNITY): Payer: Self-pay

## 2023-12-03 ENCOUNTER — Encounter (INDEPENDENT_AMBULATORY_CARE_PROVIDER_SITE_OTHER): Payer: Medicare Other | Admitting: Ophthalmology

## 2023-12-03 NOTE — Telephone Encounter (Signed)
 Pharmacy Patient Advocate Encounter  Received notification from [facility] that Prior Authorization for FreeStyle Libre 2 Plus Sensor  has been APPROVED from 12/01/23 to 03/19/24. Ran test claim, Copay is $0. This test claim was processed through [facility]- copay amounts may vary at other pharmacies due to pharmacy/plan contracts, or as the patient moves through the different stages of their insurance plan.   PA #/Case ID/Reference #: [id number]

## 2023-12-05 ENCOUNTER — Telehealth: Payer: Self-pay

## 2023-12-05 NOTE — Telephone Encounter (Signed)
 Copied from CRM [id number]. Topic: Clinical - Medical Advice >> Dec 05, 2023 10:55 AM Myrick T wrote: Reason for CRM: patient called stated she had her first shingles shot on 06/29/21 at [facility] and when she went to get her flu shot at [facility] they do not have record of her 2nd shot. Please f/u with patient to let her know when she got her 2nd shot

## 2023-12-05 NOTE — Telephone Encounter (Signed)
 Second shingrix  shot date given to patient .  Voiced understanding.

## 2023-12-24 ENCOUNTER — Other Ambulatory Visit: Payer: Self-pay | Admitting: Family Medicine

## 2023-12-24 DIAGNOSIS — E039 Hypothyroidism, unspecified: Secondary | ICD-10-CM

## 2024-01-08 NOTE — Progress Notes (Signed)
 Maria [name]                                          MRN: [id number]   01/08/2024   The VBCI Quality Team Specialist reviewed this patient medical record for the purposes of chart review for care gap closure. The following were reviewed: chart review for care gap closure-kidney health evaluation for diabetes:eGFR  and uACR.    VBCI Quality Team

## 2024-01-14 ENCOUNTER — Telehealth: Payer: Self-pay | Admitting: Family Medicine

## 2024-01-14 DIAGNOSIS — E1142 Type 2 diabetes mellitus with diabetic polyneuropathy: Secondary | ICD-10-CM

## 2024-01-14 DIAGNOSIS — E1169 Type 2 diabetes mellitus with other specified complication: Secondary | ICD-10-CM

## 2024-01-14 NOTE — Telephone Encounter (Signed)
 Copied from CRM [id number]. Topic: Clinical - Medication Question >> Jan 14, 2024 10:49 AM Wess RAMAN wrote: Reason for CRM: Patient would like to know if Severa Handing, FNP could find her different medications due to next year the cost rising for the following medications:   tirzepatide  (MOUNJARO ) 5 MG/0.5ML Pen  dapagliflozin  propanediol (FARXIGA ) 10 MG TABS tablet  Callback #: [id number]

## 2024-01-15 NOTE — Telephone Encounter (Signed)
 Please let patient know: There will be patient assistance for Farxiga  for 2026 No assistance for Mounjaro   Best case on insurance-->$175//month for the year for Mounjaro  (medicare payment plan, would have to call her insurance)  Refer to pharmacy if assistance is needed [id number]  Thanks!

## 2024-01-15 NOTE — Addendum Note (Signed)
 Addended by: SEVERA ROCK HERO on: 01/15/2024 04:10 PM   Modules accepted: Orders

## 2024-01-15 NOTE — Telephone Encounter (Signed)
 Patient aware and states she can not afford $175 a month.  Would like to know what you advise her to be changed to?

## 2024-01-16 ENCOUNTER — Telehealth: Payer: Self-pay | Admitting: Pharmacist

## 2024-01-16 DIAGNOSIS — E1169 Type 2 diabetes mellitus with other specified complication: Secondary | ICD-10-CM

## 2024-01-16 NOTE — Telephone Encounter (Signed)
 [id number] placed to pharmacy to discuss options for T2DM in 2026 due to insurance changes

## 2024-01-18 ENCOUNTER — Telehealth: Payer: Self-pay

## 2024-01-18 NOTE — Progress Notes (Signed)
 Care Guide Pharmacy Note  01/18/2024 Name: Maria [name] MRN: [id number] DOB: [birth year]  Referred By: Severa Rock HERO, FNP Reason for referral: Complex Care Management (Outreach to schedule with Pharm d )   Maria [name] is a 68 y.o. year old female who is a primary care patient of Severa Rock HERO, FNP.  [name] was referred to the pharmacist for assistance related to: DMII  Successful contact was made with the patient to discuss pharmacy services including being ready for the pharmacist to call at least 5 minutes before the scheduled appointment time and to have medication bottles and any blood pressure readings ready for review. The patient agreed to meet with the pharmacist via telephone visit on (date/time). 01/31/2024 Jeoffrey Buffalo , RMA     [place]  [facility], [facility] Guide  Direct Dial: [phone]  Website: [email or web address]

## 2024-01-30 NOTE — Progress Notes (Signed)
 01/31/2024 Name: Maria [name] MRN: [id number] DOB: November 23, 1955  Chief Complaint  Patient presents with   Diabetes    Maria [name] is a 68 y.o. year old female who presented for a telephone visit.   They were referred to the pharmacist by their PCP for assistance in managing diabetes.    Subjective:  Care Team: Primary Care Provider: Severa Rock HERO, FNP ; Next Scheduled Visit: 02/29/2024  Medication Access/Adherence  Current Pharmacy:  [facility] [place], [place] - [address] [place] [address] Phone: [phone] Fax: [phone]  [facility] - [place], [place] - [address] [place] [address] [place] [address] Phone: [phone] Fax: [phone]   Patient reports affordability concerns with their medications: Yes  Patient reports access/transportation concerns to their pharmacy: No  Patient reports adherence concerns with their medications:  Yes  Was told in January her cost would be $647 for her medications   Diabetes: History of diabetes > 10 years. Highest A1C 9.1% in 04/2022 when taking Farxiga , Glipizide , and Metformin  and started on Mounjaro  10/2022.  Current medications:  - Mounjaro  5 mg weekly - Farxiga  10 mg daily - Freestyle Libre, uses reader device  Medications tried in the past: Glipizide , Lantus , Metformin   Current glucose readings: A1C 7% on 11/30/23 - 138 mg/dL with some juice about 30 minutes prior  Date of Download: 01/31/2024 (read to me) Average Glucose: 143 mg/dL  Observed patterns:  Patient reports hypoglycemic episodes of 68-70 with libre but denies s/sx including dizziness, shakiness, sweating. Doesn't take anything to help, usually goes back to sleep.   Patient denies hyperglycemic symptoms including polyuria, polydipsia, polyphagia, nocturia, neuropathy, blurred vision. Always tries to keep sugar < 250.  Macrovascular and  Microvascular Risk Reduction:  Statin? yes (Rosuvastatin  20 mg daily); ACEi/ARB? yes (Lisinopril  5 mg daily) Last urinary albumin/creatinine ratio:  Lab Results  Component Value Date   MICRALBCREAT 12 02/09/2023   MICRALBCREAT 28 02/01/2022   MICRALBCREAT 63 (H) 01/26/2021   MICRALBCREAT 26 02/03/2019   MICRALBCREAT 16.3 03/04/2018   MICRALBCREAT 28.4 01/31/2017   Last eye exam:  Lab Results  Component Value Date   HMDIABEYEEXA Retinopathy (A) 03/05/2023   Last foot exam: 11/30/2023 Tobacco Use:  Tobacco Use: Medium Risk (12/03/2023)   Patient History    Smoking Tobacco Use: Former    Smokeless Tobacco Use: Never    Passive Exposure: Not on file    Objective:  Lab Results  Component Value Date   HGBA1C 7.0 (H) 11/30/2023    Lab Results  Component Value Date   CREATININE 0.65 11/30/2023   BUN 15 11/30/2023   NA 138 11/30/2023   K 4.3 11/30/2023   CL 103 11/30/2023   CO2 22 11/30/2023    Lab Results  Component Value Date   CHOL 155 11/30/2023   HDL 56 11/30/2023   LDLCALC 79 11/30/2023   TRIG 113 11/30/2023   CHOLHDL 2.8 11/30/2023    Medications Reviewed Today     Reviewed by Gaile Powell HERO, RPH (Pharmacist) on 01/31/24 at 0850  Med List Status: <None>   Medication Order Taking? Sig Documenting Provider Last Dose Status Informant  acetaminophen  (TYLENOL ) 500 MG tablet [id number]  Take 1,000 mg by mouth every 6 (six) hours as needed for moderate pain. [provider]  Active Self  albuterol  (VENTOLIN  HFA) 108 (90 Base) MCG/ACT inhaler [id number]  Inhale 2 puffs  into the lungs every 6 (six) hours as needed for wheezi ng orshortness of breath. Alphonsa Elsie RAMAN, MD  Active   bimatoprost (LUMIGAN) 0.01 % SOLN [id number]  Place 1 drop into both eyes at bedtime. [provider]  Active   calcium  carbonate (OSCAL) 1500 (600 Ca) MG TABS tablet [id number]  Take by mouth 2 (two) times daily with a meal. [provider]  Active   cetirizine  (ZYRTEC) 10 MG tablet [id number]  1 tablet Orally Once a day for 30 day(s) [provider]  Active   Cholecalciferol (VITAMIN D3) 50 MCG (2000 UT) capsule [id number]  1 tablet [provider]  Active   Continuous Blood Gluc Receiver (FREESTYLE LIBRE 2 READER) DEVI [id number]  Use to test blood sugar continuously. DX: E11.65 Severa Rock HERO, FNP  Active   Continuous Glucose Sensor (FREESTYLE LIBRE 2 SENSOR) [place] [id number]  USE TO TEST BLOOD SUGAR CONTINUOUSLY Rakes, Rock HERO, FNP  Active   dapagliflozin  propanediol (FARXIGA ) 10 MG TABS tablet [id number]  Take 1 tablet (10 mg total) by mouth daily. Severa Rock HERO, FNP  Active   denosumab  (PROLIA ) 60 MG/ML SOSY injection [id number]  Inject 60 mg into the skin every 6 (six) months. Severa Rock HERO, FNP  Active   levothyroxine  (SYNTHROID ) 50 MCG tablet [id number]  TAKE ONE TABLET DAILY Severa Rock HERO, FNP  Active   lisinopril  (ZESTRIL ) 5 MG tablet [id number]  Take 1 tablet (5 mg total) by mouth daily. for high blood pressure Rakes, Rock HERO, FNP  Active   loperamide  (IMODIUM  A-D) 2 MG tablet [id number]  Take 1 tablet (2 mg total) by mouth 4 (four) times daily as needed for diarrhea or loose stools. Zackowski, Scott, MD  Active   LUMIGAN 0.01 % SOLN [id number]  Place 1 drop into both eyes at bedtime.  [provider]  Active Self  montelukast  (SINGULAIR ) 10 MG tablet [id number]  Take 1 tablet (10 mg total) by mouth daily. Severa Rock HERO, FNP  Active   ondansetron  (ZOFRAN ) 4 MG tablet [id number]  Take 1 tablet (4 mg total) by mouth every 8 (eight) hours as needed for nausea or vomiting. Severa Rock HERO, FNP  Active   pantoprazole  (PROTONIX ) 40 MG tablet [id number]  Take 1 tablet (40 mg total) by mouth daily. Severa Rock HERO, FNP  Active     Discontinued 01/31/24 0848 (Change in therapy) polyethylene glycol powder (GLYCOLAX /MIRALAX ) 17 GM/SCOOP powder [id number]  Take 17 g by mouth daily. Severa Rock HERO, FNP  Active   PROLIA  60 MG/ML SOSY injection  [id number]  Inject 60 mg into the skin every 6 (six) months. Severa Rock HERO, FNP  Active   rosuvastatin  (CRESTOR ) 20 MG tablet [id number]  Take 1 tablet (20 mg total) by mouth daily. Severa Rock HERO, FNP  Active   tirzepatide  (MOUNJARO ) 5 MG/0.5ML Pen [id number]  Inject 5 mg into the skin once a week. Severa Rock HERO, FNP  Active   [facility] ELLIPTA 200-62.5-25 MCG/ACT AEPB [id number]  INHALE 1 PUFF ONCE DAILY Rakes, Rock HERO, FNP  Active            Med Note [place], POWELL HERO   Thu Jan 31, 2024  8:49 AM) Only uses seasonally or when sick, doesn't use regularly  triamcinolone  cream (KENALOG ) 0.1 % [id number]  Apply 1 application topically 2 (two) times daily as needed (dry skin). Waddell, Malena M, DO  Active   vitamin B-12 (CYANOCOBALAMIN ) 1000 MCG tablet [id number]  Take 1,000 mcg by  mouth daily. [provider]  Active               Assessment/Plan:   Diabetes: - Currently controlled; goal A1c <7%. Cardiorenal risk reduction is optimized.. Blood pressure not able to assess. LDL is not at goal.  - Reviewed long term cardiovascular and renal outcomes of uncontrolled blood sugar., Reviewed goal A1c, goal fasting, and goal 2 hour post prandial glucose. Recommended to check glucose two times daily, and Reviewed signs and symptoms of hypoglycemia. - Recommend to start Freestyle Libre 3 sensors ., Recommend to continue Mounjaro  5 mg weekly, Farxiga  10 mg daily. - Recommended to have something on hand for lows overnight, despite not dropping lower than 68 - Recommended to enroll in Medicare Prescription Payment Plan (M3P) with insurance for new year - Recommended to receive home delivery of Libre 3 Sensors, Mounjaro , and Farxiga  by end of year - Enroll in Az&Me and assist with Extra Help/Low Income Subsidy enrollment  Prolia : - Prolia  due in December, assessing cost; routed separate encounter to Prolia  CPhT team  Follow Up Plan: 1 month with Rock Bruns, beginning of year with pharmacy to  assess affordability of medications.   Powell Gallus, PharmD, MPH Pharmacy Resident  Mliss Tarry Griffin, PharmD, BCACP, CPP Clinical Pharmacist, [facility], [facility]

## 2024-01-31 ENCOUNTER — Other Ambulatory Visit (HOSPITAL_COMMUNITY): Payer: Self-pay

## 2024-01-31 ENCOUNTER — Telehealth: Payer: Self-pay | Admitting: Pharmacist

## 2024-01-31 ENCOUNTER — Other Ambulatory Visit (INDEPENDENT_AMBULATORY_CARE_PROVIDER_SITE_OTHER)

## 2024-01-31 ENCOUNTER — Telehealth: Payer: Self-pay | Admitting: Family Medicine

## 2024-01-31 ENCOUNTER — Telehealth: Payer: Self-pay

## 2024-01-31 DIAGNOSIS — M810 Age-related osteoporosis without current pathological fracture: Secondary | ICD-10-CM

## 2024-01-31 DIAGNOSIS — E1169 Type 2 diabetes mellitus with other specified complication: Secondary | ICD-10-CM

## 2024-01-31 MED ORDER — DAPAGLIFLOZIN PROPANEDIOL 10 MG PO TABS
10.0000 mg | ORAL_TABLET | Freq: Every day | ORAL | 1 refills | Status: DC
  Filled 2024-01-31: qty 90, 90d supply, fill #0

## 2024-01-31 MED ORDER — MOUNJARO 5 MG/0.5ML ~~LOC~~ SOAJ
5.0000 mg | SUBCUTANEOUS | 1 refills | Status: AC
  Filled 2024-01-31: qty 6, 84d supply, fill #0

## 2024-01-31 MED ORDER — FREESTYLE LIBRE 3 PLUS SENSOR MISC
3 refills | Status: AC
  Filled 2024-01-31: qty 6, 90d supply, fill #0

## 2024-01-31 MED ORDER — DENOSUMAB 60 MG/ML ~~LOC~~ SOSY: 60.0000 mg | PREFILLED_SYRINGE | SUBCUTANEOUS | Status: DC

## 2024-01-31 NOTE — Telephone Encounter (Signed)
 Pt ready for scheduling for PROLIA  on or after : 03/12/24  Option# 1: Buy/Bill (Office supplied medication)  Out-of-pocket cost due at time of clinic visit: $362  Number of injection/visits approved: 2  Primary: UHC-MEDICARE Prolia  co-insurance: 20% Admin fee co-insurance: $30  Secondary: --- Prolia  co-insurance:  Admin fee co-insurance:   Medical Benefit Details: Date Benefits were checked: 01/31/24 Deductible: NO/ Coinsurance: 20%/ Admin Fee: $30  Prior Auth: APPROVED PA# [id number] Expiration Date: 09/10/23-09/09/24  # of doses approved: 2 ----------------------------------------------------------------------- Option# 2- Med Obtained from pharmacy: Prolia  is no longer preferred for pharmacy benefit. Jubbonti is now preferred. PRICING IS FOR JUBBONTI  Pharmacy benefit: Copay $0 (Paid to pharmacy) Admin Fee: $30 (Pay at clinic)  Prior Auth: N/A PA# Expiration Date:   # of doses approved:   If patient wants fill through the pharmacy benefit please send prescription to: [facility], and include estimated need by date in rx notes. Pharmacy will ship medication directly to the office.  Patient NOT eligible for Prolia  Copay Card. Copay Card can make patient's cost as little as $25. Link to apply: [email or web address]://[email or web address].[email or web address]/copay  ** This summary of benefits is an estimation of the patient's out-of-pocket cost. Exact cost may very based on individual plan coverage.

## 2024-01-31 NOTE — Patient Instructions (Signed)
 Next Steps after Today's Visit: - Call insurance plan and enroll in Medicare Prescription Payment Plan (M3P) - Complete application for Farxiga  assistance through Az&Me (signed at office) - Complete application for Extra Help/Low Income Subsidy: pharmacy advocate will reach out to assist you - Receive home delivery of 3 months of Mounjaro  from [facility] before end of year - Receive home delivery of 3 months of Farxiga  from [facility] before end of year

## 2024-01-31 NOTE — Telephone Encounter (Signed)
 Prolia BIV in separate encounter. Will route encounter back once benefit verification is complete.

## 2024-01-31 NOTE — Telephone Encounter (Signed)
 Maria [name]

## 2024-01-31 NOTE — Telephone Encounter (Signed)
 Spoke with pt to find out her questions. Pt states when she came in she had to fill out paperwork with the front desk. Informed pt I do not know what paper work she had to fill out and ill have to have the front call her back. Pt very argumentative with me stating I was the one that told her to sign the paperwork. Reiterated multiple times to patient I am one of the nurses and not the front desk staff and that I do not know about the paperwork she signed at the front. Pt then hung up

## 2024-01-31 NOTE — Telephone Encounter (Signed)
 Patient has already been on Prolia  for years.  Can we run part B vs D to see if it would be cheaper giving the injection at our practice? T Score -3.3 Please route back to me when benefits determined Thank you!!

## 2024-01-31 NOTE — Telephone Encounter (Signed)
 Prolia  VOB initiated via [email or web address]  Next Prolia  inj DUE: 03/12/24

## 2024-01-31 NOTE — Telephone Encounter (Signed)
 Copied from CRM [id number]. Topic: General - Call Back - No Documentation >> Jan 31, 2024 12:09 PM Geneva B wrote: Reason for CRM: patient has questions about her appt from 01/31/2024 please call pt back [phone] (H) [phone] (M)

## 2024-02-06 ENCOUNTER — Telehealth: Payer: Self-pay

## 2024-02-06 NOTE — Telephone Encounter (Signed)
-----   Message from Powell Maria [name] sent at 01/31/2024 10:49 AM EST ----- Regarding: LIS/Extra Help Enrollment Good morning! This patient should be eligible for Extra Help/LIS. We discussed her also enrolling in M3P. Also initiated an Az&Me application, I think she will be here at [facility] to sign today.

## 2024-02-06 NOTE — Telephone Encounter (Signed)
 PAP: Application for Farxiga has been submitted to [employer] (AZ&Me), via fax  NEW

## 2024-02-06 NOTE — Telephone Encounter (Signed)
 Left message requesting call back regarding LIS/ Extra Help assistance - call back [phone]

## 2024-02-06 NOTE — Telephone Encounter (Signed)
 Patient returned call.   Says she checked into LIS/ Extra Help and she doesn't quailfy. She is over the limit by a few dollars.   She is aware I'm submitting her AZ&ME application today for her Farxiga  assistance (separate encounter created).

## 2024-02-22 ENCOUNTER — Telehealth: Payer: Self-pay

## 2024-02-22 DIAGNOSIS — M810 Age-related osteoporosis without current pathological fracture: Secondary | ICD-10-CM

## 2024-02-22 MED ORDER — DENOSUMAB 60 MG/ML ~~LOC~~ SOSY: 60.0000 mg | PREFILLED_SYRINGE | Freq: Once | SUBCUTANEOUS | Status: DC

## 2024-02-22 NOTE — Telephone Encounter (Signed)
 Prolia  sent for benefit verification

## 2024-02-25 ENCOUNTER — Other Ambulatory Visit (HOSPITAL_COMMUNITY): Payer: Self-pay

## 2024-02-27 ENCOUNTER — Ambulatory Visit: Payer: Self-pay

## 2024-02-27 VITALS — BP 135/72 | HR 89 | Ht 62.0 in | Wt 95.0 lb

## 2024-02-27 DIAGNOSIS — Z0000 Encounter for general adult medical examination without abnormal findings: Secondary | ICD-10-CM | POA: Diagnosis not present

## 2024-02-27 NOTE — Progress Notes (Signed)
 Chief Complaint  Patient presents with   Medicare Wellness     Subjective:   Maria [name] is a 68 y.o. female who presents for a Medicare Annual Wellness Visit.  Visit info / Clinical Intake: Medicare Wellness Visit Type:: Subsequent Annual Wellness Visit Persons participating in visit and providing information:: patient Medicare Wellness Visit Mode:: Telephone If telephone:: video declined Since this visit was completed virtually, some vitals may be partially provided or unavailable. Missing vitals are due to the limitations of the virtual format.: Documented vitals are patient reported If Telephone or Video please confirm:: I connected with patient using audio/video enable telemedicine. I verified patient identity with two identifiers, discussed telehealth limitations, and patient agreed to proceed. Patient Location:: home Provider Location:: home office Interpreter Needed?: No Pre-visit prep was completed: yes AWV questionnaire completed by patient prior to visit?: no Living arrangements:: lives with spouse/significant other Patient's Overall Health Status Rating: very good Typical amount of pain: none Does pain affect daily life?: no Are you currently prescribed opioids?: no  Dietary Habits and Nutritional Risks How many meals a day?: 2 Eats fruit and vegetables daily?: yes Most meals are obtained by: preparing own meals In the last 2 weeks, have you had any of the following?: (!) nausea, vomiting, diarrhea Diabetic:: (!) yes Any non-healing wounds?: no How often do you check your BS?: continuous glucose monitor Would you like to be referred to a Nutritionist or for Diabetic Management? : no  Functional Status Activities of Daily Living (to include ambulation/medication): Independent Ambulation: Independent Medication Administration: Independent Home Management (perform basic housework or laundry): Independent Manage your own finances?: yes Primary transportation  is: driving Concerns about vision?: no *vision screening is required for [facility]* (last ov w/2025/Dr. Cleatus and Dr. Elvia) Concerns about hearing?: (!) yes Uses hearing aids?: no  Fall Screening Falls in the past year?: 0 Number of falls in past year: 0 Was there an injury with Fall?: 0 Fall Risk Category Calculator: 0 Patient Fall Risk Level: Low Fall Risk  Fall Risk Patient at Risk for Falls Due to: No Fall Risks Fall risk Follow up: Falls evaluation completed; Education provided  Home and Transportation Safety: All rugs have non-skid backing?: yes All stairs or steps have railings?: yes Grab bars in the bathtub or shower?: yes Have non-skid surface in bathtub or shower?: yes Good home lighting?: yes Regular seat belt use?: yes Hospital stays in the last year:: no  Cognitive Assessment Difficulty concentrating, remembering, or making decisions? : no Will 6CIT or Mini Cog be Completed: yes What year is it?: 0 points Give patient an address phrase to remember (5 components): [address] [place] About what time is it?: 0 points Count backwards from 20 to 1: 0 points Say the months of the year in reverse: 0 points  Advance Directives (For Healthcare) Does Patient Have a Medical Advance Directive?: No Would patient like information on creating a medical advance directive?: No - Patient declined  Reviewed/Updated  Reviewed/Updated: Reviewed All (Medical, Surgical, Family, Medications, Allergies, Care Teams, Patient Goals); Medical History; Surgical History; Family History; Medications; Allergies; Care Teams; Patient Goals    Allergies (verified) Ozempic  (0.25 or 0.5 mg-dose) [semaglutide (0.25 or 0.5mg -dos)]   Current Medications (verified) Outpatient Encounter Medications as of 02/27/2024  Medication Sig   acetaminophen  (TYLENOL ) 500 MG tablet Take 1,000 mg by mouth every 6 (six) hours as needed for moderate pain.   albuterol  (VENTOLIN  HFA) 108 (90 Base)  MCG/ACT inhaler Inhale 2 puffs into  the lungs every 6 (six) hours as needed for wheezi ng orshortness of breath.   bimatoprost (LUMIGAN) 0.01 % SOLN Place 1 drop into both eyes at bedtime.   calcium  carbonate (OSCAL) 1500 (600 Ca) MG TABS tablet Take by mouth 2 (two) times daily with a meal.   cetirizine (ZYRTEC) 10 MG tablet 1 tablet Orally Once a day for 30 day(s)   Cholecalciferol (VITAMIN D3) 50 MCG (2000 UT) capsule 1 tablet   Continuous Blood Gluc Receiver (FREESTYLE LIBRE 2 READER) DEVI Use to test blood sugar continuously. DX: E11.65   Continuous Glucose Sensor (FREESTYLE LIBRE 3 PLUS SENSOR) MISC Change sensor every 15 days. DX:E11.65   dapagliflozin  propanediol (FARXIGA ) 10 MG TABS tablet Take 1 tablet (10 mg total) by mouth daily.   levothyroxine  (SYNTHROID ) 50 MCG tablet TAKE ONE TABLET DAILY   lisinopril  (ZESTRIL ) 5 MG tablet Take 1 tablet (5 mg total) by mouth daily. for high blood pressure   loperamide  (IMODIUM  A-D) 2 MG tablet Take 1 tablet (2 mg total) by mouth 4 (four) times daily as needed for diarrhea or loose stools.   LUMIGAN 0.01 % SOLN Place 1 drop into both eyes at bedtime.    montelukast  (SINGULAIR ) 10 MG tablet Take 1 tablet (10 mg total) by mouth daily.   ondansetron  (ZOFRAN ) 4 MG tablet Take 1 tablet (4 mg total) by mouth every 8 (eight) hours as needed for nausea or vomiting.   pantoprazole  (PROTONIX ) 40 MG tablet Take 1 tablet (40 mg total) by mouth daily.   polyethylene glycol powder (GLYCOLAX /MIRALAX ) 17 GM/SCOOP powder Take 17 g by mouth daily.   rosuvastatin  (CRESTOR ) 20 MG tablet Take 1 tablet (20 mg total) by mouth daily.   tirzepatide  (MOUNJARO ) 5 MG/0.5ML Pen Inject 5 mg into the skin once a week. DX:E11.65   TRELEGY ELLIPTA 200-62.5-25 MCG/ACT AEPB INHALE 1 PUFF ONCE DAILY   triamcinolone  cream (KENALOG ) 0.1 % Apply 1 application topically 2 (two) times daily as needed (dry skin).   vitamin B-12 (CYANOCOBALAMIN ) 1000 MCG tablet Take 1,000 mcg by mouth  daily.   Facility-Administered Encounter Medications as of 02/27/2024  Medication   denosumab  (PROLIA ) injection 60 mg    History: Past Medical History:  Diagnosis Date   Allergy    Asthma    Cancer (HCC)    cervical   Complication of anesthesia     I woke and was short of breath.   CTS (carpal tunnel syndrome)    Diabetes mellitus without complication (HCC)    Early cataracts, bilateral    Fracture    trimalleolar ankle left   GERD (gastroesophageal reflux disease)    Glaucoma    Glaucoma    History of kidney stones     I passed it   Hyperlipidemia    Hypertension    Hypothyroidism    Neuropathy    Vitamin D  deficiency    Wears glasses    Past Surgical History:  Procedure Laterality Date   ABDOMINAL HYSTERECTOMY     APPENDECTOMY     CARDIAC CATHETERIZATION  2006   negative   COLONOSCOPY  2009   negative   INNER EAR SURGERY Right 1981   left shoulder surgery Left 10/2009   ORIF ANKLE FRACTURE Left 09/05/2018   Procedure: OPEN REDUCTION INTERNAL FIXATION TRIMALLEOLAR FRACTURE POSSIBLE SYNDEMOSIS;  Surgeon: Elsa Lonni SAUNDERS, MD;  Location: [facility];  Service: Orthopedics;  Laterality: Left;   TONSILLECTOMY     TUBAL LIGATION     Family History  Problem Relation Age of Onset   Diabetes Mother    Prostate cancer Father    Heart disease Father    Heart attack Other    Breast cancer Neg Hx    Social History   Occupational History   Not on file  Tobacco Use   Smoking status: Former    Current packs/day: 0.00    Average packs/day: 1 pack/day for 15.0 years (15.0 ttl pk-yrs)    Types: Cigarettes    Start date: 06/06/1986    Quit date: 06/05/2001    Years since quitting: 22.7   Smokeless tobacco: Never  Vaping Use   Vaping status: Never Used  Substance and Sexual Activity   Alcohol use: No   Drug use: No   Sexual activity: Not Currently   Tobacco Counseling Counseling given: Yes  SDOH Screenings   Food Insecurity: No Food Insecurity  (02/27/2024)  Housing: Low Risk  (02/27/2024)  Transportation Needs: No Transportation Needs (02/27/2024)  Utilities: Not At Risk (02/27/2024)  Alcohol Screen: Low Risk  (02/22/2023)  Depression (PHQ2-9): Low Risk  (02/27/2024)  Financial Resource Strain: Low Risk  (02/22/2023)  Physical Activity: Insufficiently Active (02/27/2024)  Social Connections: Moderately Integrated (02/27/2024)  Stress: No Stress Concern Present (02/27/2024)  Tobacco Use: Medium Risk (02/27/2024)  Health Literacy: Adequate Health Literacy (02/27/2024)   See flowsheets for full screening details  Depression Screen PHQ 2 & 9 Depression Scale- Over the past 2 weeks, how often have you been bothered by any of the following problems? Little interest or pleasure in doing things: 0 Feeling down, depressed, or hopeless (PHQ Adolescent also includes...irritable): 0 PHQ-2 Total Score: 0 Trouble falling or staying asleep, or sleeping too much: 0 Feeling tired or having little energy: 0 Poor appetite or overeating (PHQ Adolescent also includes...weight loss): 0 Feeling bad about yourself - or that you are a failure or have let yourself or your family down: 0 Trouble concentrating on things, such as reading the newspaper or watching television (PHQ Adolescent also includes...like school work): 0 Moving or speaking so slowly that other people could have noticed. Or the opposite - being so fidgety or restless that you have been moving around a lot more than usual: 0 Thoughts that you would be better off dead, or of hurting yourself in some way: 0 PHQ-9 Total Score: 0 If you checked off any problems, how difficult have these problems made it for you to do your work, take care of things at home, or get along with other people?: Not difficult at all     Goals Addressed             This Visit's Progress    Have 3 meals a day   On track    Pt would like continue to eat healthy and maintain blood sugars.               Objective:    Today's Vitals   02/27/24 1354  BP: 135/72  Pulse: 89  Weight: 95 lb (43.1 kg)  Height: 5' 2 (1.575 m)   Body mass index is 17.38 kg/m.  Hearing/Vision screen No results found. Immunizations and Health Maintenance Health Maintenance  Topic Date Due   COVID-19 Vaccine (1) Never done   Diabetic kidney evaluation - Urine ACR  02/09/2024   Colonoscopy  05/15/2024 (Originally 10/07/2017)   OPHTHALMOLOGY EXAM  03/04/2024   HEMOGLOBIN A1C  05/29/2024   Mammogram  08/14/2024   Diabetic kidney evaluation - eGFR measurement  11/29/2024  FOOT EXAM  11/29/2024   Medicare Annual Wellness (AWV)  02/26/2025   Bone Density Scan  09/02/2025   DTaP/Tdap/Td (3 - Td or Tdap) 12/04/2033   Pneumococcal Vaccine: 50+ Years  Completed   Influenza Vaccine  Completed   Hepatitis C Screening  Completed   Zoster Vaccines- Shingrix   Completed   Meningococcal B Vaccine  Aged Out        Assessment/Plan:  This is a routine wellness examination for Maria [name].  Patient Care Team: Severa Rock HERO, FNP as PCP - General (Family Medicine) Billee Mliss BIRCH, RPH-CPP as Pharmacist (Family Medicine) Cleatus Collar, MD as Consulting Physician (Ophthalmology)  I have personally reviewed and noted the following in the patients chart:   Medical and social history Use of alcohol, tobacco or illicit drugs  Current medications and supplements including opioid prescriptions. Functional ability and status Nutritional status Physical activity Advanced directives List of other physicians Hospitalizations, surgeries, and ER visits in previous 12 months Vitals Screenings to include cognitive, depression, and falls Referrals and appointments  No orders of the defined types were placed in this encounter.  In addition, I have reviewed and discussed with patient certain preventive protocols, quality metrics, and best practice recommendations. A written personalized care plan for preventive services as  well as general preventive health recommendations were provided to patient.   Maria [name], CMA   02/27/2024   Return in 1 year (on 02/26/2025).  After Visit Summary: (MyChart) Due to this being a telephonic visit, the after visit summary with patients personalized plan was offered to patient via MyChart   Nurse Notes: pt do covid vaccine [place] ACR

## 2024-02-27 NOTE — Patient Instructions (Signed)
 Maria [name],  Thank you for taking the time for your Medicare Wellness Visit. I appreciate your continued commitment to your health goals. Please review the care plan we discussed, and feel free to reach out if I can assist you further.  Please note that Annual Wellness Visits do not include a physical exam. Some assessments may be limited, especially if the visit was conducted virtually. If needed, we may recommend an in-person follow-up with your provider.  Ongoing Care Seeing your primary care provider every 3 to 6 months helps us  monitor your health and provide consistent, personalized care.   Referrals If a referral was made during today's visit and you haven't received any updates within two weeks, please contact the referred provider directly to check on the status.  Recommended Screenings:  Health Maintenance  Topic Date Due   COVID-19 Vaccine (1) Never done   Yearly kidney health urinalysis for diabetes  02/09/2024   Medicare Annual Wellness Visit  02/22/2024   Colon Cancer Screening  05/15/2024*   Eye exam for diabetics  03/04/2024   Hemoglobin A1C  05/29/2024   Breast Cancer Screening  08/14/2024   Yearly kidney function blood test for diabetes  11/29/2024   Complete foot exam   11/29/2024   Osteoporosis screening with Bone Density Scan  09/02/2025   DTaP/Tdap/Td vaccine (3 - Td or Tdap) 12/04/2033   Pneumococcal Vaccine for age over 75  Completed   Flu Shot  Completed   Hepatitis C Screening  Completed   Zoster (Shingles) Vaccine  Completed   Meningitis B Vaccine  Aged Out  *Topic was postponed. The date shown is not the original due date.       02/27/2024    1:56 PM  Advanced Directives  Does Patient Have a Medical Advance Directive? No  Would patient like information on creating a medical advance directive? No - Patient declined    Vision: Annual vision screenings are recommended for early detection of glaucoma, cataracts, and diabetic retinopathy. These exams  can also reveal signs of chronic conditions such as diabetes and high blood pressure.  Dental: Annual dental screenings help detect early signs of oral cancer, gum disease, and other conditions linked to overall health, including heart disease and diabetes.  Please see the attached documents for additional preventive care recommendations.

## 2024-02-27 NOTE — Addendum Note (Signed)
 Addended by: Shanea Karney D on: 02/27/2024 11:28 AM   Modules accepted: Level of Service

## 2024-02-29 ENCOUNTER — Ambulatory Visit: Payer: Self-pay | Admitting: Family Medicine

## 2024-02-29 ENCOUNTER — Encounter: Payer: Self-pay | Admitting: Family Medicine

## 2024-02-29 VITALS — BP 110/67 | HR 83 | Temp 97.3°F | Ht 62.0 in | Wt 100.0 lb

## 2024-02-29 DIAGNOSIS — E113311 Type 2 diabetes mellitus with moderate nonproliferative diabetic retinopathy with macular edema, right eye: Secondary | ICD-10-CM

## 2024-02-29 DIAGNOSIS — J4540 Moderate persistent asthma, uncomplicated: Secondary | ICD-10-CM

## 2024-02-29 DIAGNOSIS — K219 Gastro-esophageal reflux disease without esophagitis: Secondary | ICD-10-CM

## 2024-02-29 DIAGNOSIS — E1169 Type 2 diabetes mellitus with other specified complication: Secondary | ICD-10-CM

## 2024-02-29 DIAGNOSIS — M810 Age-related osteoporosis without current pathological fracture: Secondary | ICD-10-CM

## 2024-02-29 DIAGNOSIS — E1142 Type 2 diabetes mellitus with diabetic polyneuropathy: Secondary | ICD-10-CM

## 2024-02-29 DIAGNOSIS — E1159 Type 2 diabetes mellitus with other circulatory complications: Secondary | ICD-10-CM

## 2024-02-29 DIAGNOSIS — J0140 Acute pansinusitis, unspecified: Secondary | ICD-10-CM

## 2024-02-29 LAB — CMP14+EGFR
ALT: 15 IU/L (ref 0–32)
AST: 14 IU/L (ref 0–40)
Albumin: 4.2 g/dL (ref 3.9–4.9)
Alkaline Phosphatase: 78 IU/L (ref 49–135)
BUN/Creatinine Ratio: 18 (ref 12–28)
BUN: 11 mg/dL (ref 8–27)
Bilirubin Total: 0.8 mg/dL (ref 0.0–1.2)
CO2: 22 mmol/L (ref 20–29)
Calcium: 8.8 mg/dL (ref 8.7–10.3)
Chloride: 107 mmol/L — ABNORMAL HIGH (ref 96–106)
Creatinine, Ser: 0.62 mg/dL (ref 0.57–1.00)
Globulin, Total: 2.2 g/dL (ref 1.5–4.5)
Glucose: 187 mg/dL — ABNORMAL HIGH (ref 70–99)
Potassium: 3.8 mmol/L (ref 3.5–5.2)
Sodium: 143 mmol/L (ref 134–144)
Total Protein: 6.4 g/dL (ref 6.0–8.5)
eGFR: 97 mL/min/1.73 (ref >59)

## 2024-02-29 LAB — T4, FREE: Free T4: 1.26 ng/dL (ref 0.82–1.77)

## 2024-02-29 LAB — TSH: TSH: 4.53 u[IU]/mL — ABNORMAL HIGH (ref 0.450–4.500)

## 2024-02-29 LAB — BAYER DCA HB A1C WAIVED: HB A1C (BAYER DCA - WAIVED): 6.8 % — ABNORMAL HIGH (ref 4.8–5.6)

## 2024-02-29 MED ORDER — PANTOPRAZOLE SODIUM 40 MG PO TBEC: 40.0000 mg | DELAYED_RELEASE_TABLET | Freq: Every day | ORAL | 1 refills | Status: AC

## 2024-02-29 MED ORDER — LISINOPRIL 5 MG PO TABS: 5.0000 mg | ORAL_TABLET | Freq: Every day | ORAL | 1 refills | Status: AC

## 2024-02-29 MED ORDER — AMOXICILLIN-POT CLAVULANATE 875-125 MG PO TABS: 1.0000 | ORAL_TABLET | Freq: Two times a day (BID) | ORAL | 0 refills | Status: AC

## 2024-02-29 MED ORDER — MONTELUKAST SODIUM 10 MG PO TABS: 10.0000 mg | ORAL_TABLET | Freq: Every day | ORAL | 1 refills | Status: AC

## 2024-02-29 MED ORDER — JUBBONTI 60 MG/ML ~~LOC~~ SOSY: 60.0000 mg | PREFILLED_SYRINGE | SUBCUTANEOUS | 0 refills | Status: DC

## 2024-02-29 NOTE — Telephone Encounter (Signed)
Patient aware and appt scheduled.

## 2024-02-29 NOTE — Addendum Note (Signed)
 Addended by: BRANDY SETTER D on: 02/29/2024 03:48 PM   Modules accepted: Orders

## 2024-02-29 NOTE — Progress Notes (Signed)
 Subjective:  Patient ID: Maria [name], female    DOB: 30-Dec-1955, 68 y.o.   MRN: [id number]  Patient Care Team: Severa Rock HERO, FNP as PCP - General (Family Medicine) Billee Mliss BIRCH, RPH-CPP as Pharmacist (Family Medicine) Cleatus Collar, MD as Consulting Physician (Ophthalmology)   Chief Complaint:  Diabetes (3 month follow up ), Headache, and Nasal Congestion (X 1 month on and off )   HPI: Maria [name] is a 68 y.o. female presenting on 02/29/2024 for Diabetes (3 month follow up ), Headache, and Nasal Congestion (X 1 month on and off )   Maria [name] is a 68 year old female who presents with sinus congestion and related symptoms.  She has been experiencing sinus issues for approximately one month. The symptoms have been intermittent, and she has been using Zyrtec and over-the-counter sinus medications with limited relief. She has not been using Flonase due to personal preference. Her right ear feels irritated, and there is a sensation of pressure.  She is currently taking Singulair  at night for asthma. No significant issues with asthma, noting only seasonal variations.  No headaches, chest pain, or leg swelling.  She is managing her diabetes with Farxiga  and a weekly dose of 5 mg Mounjaro . Postprandial blood sugar levels occasionally reach 238-250 mg/dL, but her J8r has been well-controlled in the past, with recent values around 7.0%.  She is on rosuvastatin  20 mg for cholesterol management. No muscle aches associated with this medication.  She experiences neuropathy, which is slightly worse, particularly at night. She takes magnesium  to help manage the symptoms. She tried an over-the-counter supplement, Neuriva, but it caused severe gastrointestinal side effects, including diarrhea and vomiting, which took several days to resolve.  Her retinopathy is being monitored with regular eye checks and imaging, with the last appointment in September. She is scheduled for follow-up  in June.  She has been coordinating her second Prolia  shot with her pharmacy.          Relevant past medical, surgical, family, and social history reviewed and updated as indicated.  Allergies and medications reviewed and updated. Data reviewed: Chart in Epic.   Past Medical History:  Diagnosis Date   Allergy    Asthma    Cancer (HCC)    cervical   Complication of anesthesia     I woke and was short of breath.   CTS (carpal tunnel syndrome)    Diabetes mellitus without complication (HCC)    Early cataracts, bilateral    Fracture    trimalleolar ankle left   GERD (gastroesophageal reflux disease)    Glaucoma    Glaucoma    History of kidney stones     I passed it   Hyperlipidemia    Hypertension    Hypothyroidism    Neuropathy    Vitamin D  deficiency    Wears glasses     Past Surgical History:  Procedure Laterality Date   ABDOMINAL HYSTERECTOMY     APPENDECTOMY     CARDIAC CATHETERIZATION  2006   negative   COLONOSCOPY  2009   negative   INNER EAR SURGERY Right 1981   left shoulder surgery Left 10/2009   ORIF ANKLE FRACTURE Left 09/05/2018   Procedure: OPEN REDUCTION INTERNAL FIXATION TRIMALLEOLAR FRACTURE POSSIBLE SYNDEMOSIS;  Surgeon: Elsa Lonni SAUNDERS, MD;  Location: [facility] OR;  Service: Orthopedics;  Laterality: Left;   TONSILLECTOMY     TUBAL LIGATION      Social History  Socioeconomic History   Marital status: Married    Spouse name: Oneil   Number of children: 2   Years of education: Not on file   Highest education level: Not on file  Occupational History   Not on file  Tobacco Use   Smoking status: Former    Current packs/day: 0.00    Average packs/day: 1 pack/day for 15.0 years (15.0 ttl pk-yrs)    Types: Cigarettes    Start date: 06/06/1986    Quit date: 06/05/2001    Years since quitting: 22.7   Smokeless tobacco: Never  Vaping Use   Vaping status: Never Used  Substance and Sexual Activity   Alcohol use: No   Drug use: No    Sexual activity: Not Currently  Other Topics Concern   Not on file  Social History Narrative   2 sons.   3 grandchildren.   Social Drivers of Health   Tobacco Use: Medium Risk (02/29/2024)   Patient History    Smoking Tobacco Use: Former    Smokeless Tobacco Use: Never    Passive Exposure: Not on file  Financial Resource Strain: Low Risk (02/22/2023)   Overall Financial Resource Strain (CARDIA)    Difficulty of Paying Living Expenses: Not hard at all  Food Insecurity: No Food Insecurity (02/27/2024)   Epic    Worried About Programme Researcher, Broadcasting/film/video in the Last Year: Never true    Ran Out of Food in the Last Year: Never true  Transportation Needs: No Transportation Needs (02/27/2024)   Epic    Lack of Transportation (Medical): No    Lack of Transportation (Non-Medical): No  Physical Activity: Insufficiently Active (02/27/2024)   Exercise Vital Sign    Days of Exercise per Week: 3 days    Minutes of Exercise per Session: 30 min  Stress: No Stress Concern Present (02/27/2024)   [employer] of Occupational Health - Occupational Stress Questionnaire    Feeling of Stress: Not at all  Social Connections: Moderately Integrated (02/27/2024)   Social Connection and Isolation Panel    Frequency of Communication with Friends and Family: More than three times a week    Frequency of Social Gatherings with Friends and Family: Three times a week    Attends Religious Services: 1 to 4 times per year    Active Member of Clubs or Organizations: No    Attends Banker Meetings: Never    Marital Status: Married  Catering Manager Violence: Not At Risk (02/27/2024)   Epic    Fear of Current or Ex-Partner: No    Emotionally Abused: No    Physically Abused: No    Sexually Abused: No  Depression (PHQ2-9): Low Risk (02/29/2024)   Depression (PHQ2-9)    PHQ-2 Score: 0  Alcohol Screen: Low Risk (02/22/2023)   Alcohol Screen    Last Alcohol Screening Score (AUDIT): 0  Housing: Low Risk  (02/27/2024)   Epic    Unable to Pay for Housing in the Last Year: No    Number of Times Moved in the Last Year: 0    Homeless in the Last Year: No  Utilities: Not At Risk (02/27/2024)   Epic    Threatened with loss of utilities: No  Health Literacy: Adequate Health Literacy (02/27/2024)   B1300 Health Literacy    Frequency of need for help with medical instructions: Never    Outpatient Encounter Medications as of 02/29/2024  Medication Sig   acetaminophen  (TYLENOL ) 500 MG tablet Take 1,000 mg by  mouth every 6 (six) hours as needed for moderate pain.   albuterol  (VENTOLIN  HFA) 108 (90 Base) MCG/ACT inhaler Inhale 2 puffs into the lungs every 6 (six) hours as needed for wheezi ng orshortness of breath.   amoxicillin -clavulanate (AUGMENTIN ) 875-125 MG tablet Take 1 tablet by mouth 2 (two) times daily.   bimatoprost (LUMIGAN) 0.01 % SOLN Place 1 drop into both eyes at bedtime.   calcium  carbonate (OSCAL) 1500 (600 Ca) MG TABS tablet Take by mouth 2 (two) times daily with a meal.   cetirizine (ZYRTEC) 10 MG tablet 1 tablet Orally Once a day for 30 day(s)   Cholecalciferol (VITAMIN D3) 50 MCG (2000 UT) capsule 1 tablet   Continuous Blood Gluc Receiver (FREESTYLE LIBRE 2 READER) DEVI Use to test blood sugar continuously. DX: E11.65   Continuous Glucose Sensor (FREESTYLE LIBRE 3 PLUS SENSOR) MISC Change sensor every 15 days. DX:E11.65   dapagliflozin  propanediol (FARXIGA ) 10 MG TABS tablet Take 1 tablet (10 mg total) by mouth daily.   levothyroxine  (SYNTHROID ) 50 MCG tablet TAKE ONE TABLET DAILY   loperamide  (IMODIUM  A-D) 2 MG tablet Take 1 tablet (2 mg total) by mouth 4 (four) times daily as needed for diarrhea or loose stools.   LUMIGAN 0.01 % SOLN Place 1 drop into both eyes at bedtime.    ondansetron  (ZOFRAN ) 4 MG tablet Take 1 tablet (4 mg total) by mouth every 8 (eight) hours as needed for nausea or vomiting.   polyethylene glycol powder (GLYCOLAX /MIRALAX ) 17 GM/SCOOP powder Take 17 g by  mouth daily.   rosuvastatin  (CRESTOR ) 20 MG tablet Take 1 tablet (20 mg total) by mouth daily.   tirzepatide  (MOUNJARO ) 5 MG/0.5ML Pen Inject 5 mg into the skin once a week. DX:E11.65   TRELEGY ELLIPTA 200-62.5-25 MCG/ACT AEPB INHALE 1 PUFF ONCE DAILY   triamcinolone  cream (KENALOG ) 0.1 % Apply 1 application topically 2 (two) times daily as needed (dry skin).   vitamin B-12 (CYANOCOBALAMIN ) 1000 MCG tablet Take 1,000 mcg by mouth daily.   lisinopril  (ZESTRIL ) 5 MG tablet Take 1 tablet (5 mg total) by mouth daily. for high blood pressure   montelukast  (SINGULAIR ) 10 MG tablet Take 1 tablet (10 mg total) by mouth daily.   pantoprazole  (PROTONIX ) 40 MG tablet Take 1 tablet (40 mg total) by mouth daily.   [DISCONTINUED] lisinopril  (ZESTRIL ) 5 MG tablet Take 1 tablet (5 mg total) by mouth daily. for high blood pressure   [DISCONTINUED] montelukast  (SINGULAIR ) 10 MG tablet Take 1 tablet (10 mg total) by mouth daily.   [DISCONTINUED] pantoprazole  (PROTONIX ) 40 MG tablet Take 1 tablet (40 mg total) by mouth daily.   Facility-Administered Encounter Medications as of 02/29/2024  Medication   denosumab  (PROLIA ) injection 60 mg    Allergies[1]  Pertinent ROS per HPI, otherwise unremarkable      Objective:  BP 110/67   Pulse 83   Temp (!) 97.3 F (36.3 C)   Ht 5' 2 (1.575 m)   Wt 100 lb (45.4 kg)   SpO2 100%   BMI 18.29 kg/m    Wt Readings from Last 3 Encounters:  02/29/24 100 lb (45.4 kg)  02/27/24 95 lb (43.1 kg)  11/30/23 95 lb 12.8 oz (43.5 kg)    Physical Exam Vitals and nursing note reviewed.  Constitutional:      General: She is not in acute distress.    Appearance: She is well-developed and normal weight. She is not ill-appearing, toxic-appearing or diaphoretic.  HENT:     Head:  Normocephalic and atraumatic.     Right Ear: A middle ear effusion is present.     Left Ear: A middle ear effusion is present.     Nose: Congestion present.     Right Turbinates: Enlarged.      Left Turbinates: Enlarged.     Right Sinus: Maxillary sinus tenderness and frontal sinus tenderness present.     Left Sinus: Maxillary sinus tenderness and frontal sinus tenderness present.     Mouth/Throat:     Mouth: Mucous membranes are moist.     Pharynx: Oropharynx is clear.  Eyes:     Conjunctiva/sclera: Conjunctivae normal.     Pupils: Pupils are equal, round, and reactive to light.  Cardiovascular:     Rate and Rhythm: Normal rate and regular rhythm.     Heart sounds: Normal heart sounds.  Pulmonary:     Effort: Pulmonary effort is normal.     Breath sounds: Normal breath sounds.  Musculoskeletal:     Cervical back: Normal range of motion and neck supple.     Right lower leg: No edema.     Left lower leg: No edema.  Skin:    General: Skin is warm and dry.     Capillary Refill: Capillary refill takes less than 2 seconds.  Neurological:     General: No focal deficit present.     Mental Status: She is alert and oriented to person, place, and time.  Psychiatric:        Mood and Affect: Mood normal.        Behavior: Behavior normal. Behavior is cooperative.        Thought Content: Thought content normal.        Judgment: Judgment normal.       Results for orders placed or performed in visit on 11/30/23  Bayer DCA Hb A1c Waived   Collection Time: 11/30/23  8:14 AM  Result Value Ref Range   HB A1C (BAYER DCA - WAIVED) 7.0 (H) 4.8 - 5.6 %  Thyroid  Panel With TSH   Collection Time: 11/30/23  8:20 AM  Result Value Ref Range   TSH 5.760 (H) 0.450 - 4.500 uIU/mL   T4, Total 8.7 4.5 - 12.0 ug/dL   T3 Uptake Ratio 28 24 - 39 %   Free Thyroxine Index 2.4 1.2 - 4.9  CMP14+EGFR   Collection Time: 11/30/23  8:20 AM  Result Value Ref Range   Glucose 185 (H) 70 - 99 mg/dL   BUN 15 8 - 27 mg/dL   Creatinine, Ser 9.34 0.57 - 1.00 mg/dL   eGFR 96 >40 fO/fpw/8.26   BUN/Creatinine Ratio 23 12 - 28   Sodium 138 134 - 144 mmol/L   Potassium 4.3 3.5 - 5.2 mmol/L   Chloride 103 96  - 106 mmol/L   CO2 22 20 - 29 mmol/L   Calcium  9.1 8.7 - 10.3 mg/dL   Total Protein 6.5 6.0 - 8.5 g/dL   Albumin 4.5 3.9 - 4.9 g/dL   Globulin, Total 2.0 1.5 - 4.5 g/dL   Bilirubin Total 1.2 0.0 - 1.2 mg/dL   Alkaline Phosphatase 90 44 - 121 IU/L   AST 20 0 - 40 IU/L   ALT 18 0 - 32 IU/L  VITAMIN D  25 Hydroxy (Vit-D Deficiency, Fractures)   Collection Time: 11/30/23  8:20 AM  Result Value Ref Range   Vit D, 25-Hydroxy 51.7 30.0 - 100.0 ng/mL  Lipid panel   Collection Time: 11/30/23  8:20 AM  Result Value Ref Range   Cholesterol, Total 155 100 - 199 mg/dL   Triglycerides 886 0 - 149 mg/dL   HDL 56 >60 mg/dL   VLDL Cholesterol Cal 20 5 - 40 mg/dL   LDL Chol Calc (NIH) 79 0 - 99 mg/dL   Chol/HDL Ratio 2.8 0.0 - 4.4 ratio       Pertinent labs & imaging results that were available during my care of the patient were reviewed by me and considered in my medical decision making.  Assessment & Plan:  Syliva was seen today for diabetes, headache and nasal congestion.  Diagnoses and all orders for this visit:  Type 2 diabetes mellitus with other specified complication, without long-term current use of insulin  (HCC) -     Bayer DCA Hb A1c Waived -     TSH -     CMP14+EGFR -     Microalbumin / creatinine urine ratio -     T4, Free  Hypertension associated with type 2 diabetes mellitus (HCC) -     Bayer DCA Hb A1c Waived -     TSH -     CMP14+EGFR -     lisinopril  (ZESTRIL ) 5 MG tablet; Take 1 tablet (5 mg total) by mouth daily. for high blood pressure -     Microalbumin / creatinine urine ratio -     T4, Free  Moderate persistent chronic asthma without complication -     montelukast  (SINGULAIR ) 10 MG tablet; Take 1 tablet (10 mg total) by mouth daily.  Gastroesophageal reflux disease without esophagitis -     pantoprazole  (PROTONIX ) 40 MG tablet; Take 1 tablet (40 mg total) by mouth daily.  Acute non-recurrent pansinusitis -     amoxicillin -clavulanate (AUGMENTIN ) 875-125 MG  tablet; Take 1 tablet by mouth 2 (two) times daily.  Hyperlipidemia associated with type 2 diabetes mellitus (HCC) -     Bayer DCA Hb A1c Waived -     CMP14+EGFR  Diabetic polyneuropathy associated with type 2 diabetes mellitus (HCC) -     Bayer DCA Hb A1c Waived -     TSH -     CMP14+EGFR -     T4, Free  Moderate nonproliferative diabetic retinopathy of right eye with macular edema associated with type 2 diabetes mellitus (HCC) -     Bayer DCA Hb A1c Waived -     TSH -     CMP14+EGFR -     T4, Free  Age-related osteoporosis without current pathological fracture -     CMP14+EGFR       Acute non-recurrent pansinusitis Sinus issues for about a month with intermittent symptoms. Fluid behind ears indicating sinus congestion. Right ear irritation present. - Prescribed Augmentin  for sinusitis - Instructed on proper Flonase administration technique  Type 2 diabetes mellitus with complications Blood glucose levels spike postprandially but return to baseline within two hours. Last A1c was 7.0, previously 6.9 and 5.4. No significant side effects reported. - Ordered A1c test - Monitor A1c levels; will consider increasing Mounjaro  dose if A1c is above 7.5  Moderate persistent asthma Asthma is well-controlled with current medication regimen. No recent exacerbations reported. - Continue Singulair  at night  Gastroesophageal reflux disease Reflux symptoms are well-controlled with current medication. - Refilled Protonix   Age-related osteoporosis Due for second Prolia  injection. Importance of maintaining bone density and preventing fractures discussed. - Checked availability of Prolia  injection - Administered Prolia  injection if available          Continue all  other maintenance medications.  Follow up plan: Return in about 3 months (around 05/29/2024) for DM.   Continue healthy lifestyle choices, including diet (rich in fruits, vegetables, and lean proteins, and low in salt and  simple carbohydrates) and exercise (at least 30 minutes of moderate physical activity daily).  Educational handout given for sinus infection   The above assessment and management plan was discussed with the patient. The patient verbalized understanding of and has agreed to the management plan. Patient is aware to call the clinic if they develop any new symptoms or if symptoms persist or worsen. Patient is aware when to return to the clinic for a follow-up visit. Patient educated on when it is appropriate to go to the emergency department.   Rosaline Bruns, FNP-C [facility] [phone]     [1]  Allergies Allergen Reactions   Ozempic  (0.25 Or 0.5 Mg-Dose) [Semaglutide (0.25 Or 0.5mg -Dos)] Nausea And Vomiting    N/v, no energy

## 2024-03-01 LAB — MICROALBUMIN / CREATININE URINE RATIO
Creatinine, Urine: 60.5 mg/dL
Microalb/Creat Ratio: 11 mg/g{creat} (ref 0–29)
Microalbumin, Urine: 6.4 ug/mL

## 2024-03-03 ENCOUNTER — Other Ambulatory Visit: Payer: Self-pay

## 2024-03-03 MED FILL — Denosumab-bbdz Inj Soln Prefilled Syringe 60 MG/ML: 60.0000 mg | SUBCUTANEOUS | 180 days supply | Qty: 1 | Fill #0 | Status: AC

## 2024-03-03 NOTE — Progress Notes (Signed)
 Specialty Pharmacy Initial Fill Coordination Note  Maria [name] is a 68 y.o. female contacted today regarding initial fill of specialty medication(s) Denosumab -bbdz (Jubbonti )   Patient requested Courier to Provider Office   Delivery date: 03/11/24   Verified address: [facility]-[address]   Medication will be filled on: 03/10/24   Patient is aware of $0 copayment.

## 2024-03-03 NOTE — Progress Notes (Signed)
 See benefits inv encounter from 01/31/24. Copay is $0.

## 2024-03-17 ENCOUNTER — Telehealth: Payer: Self-pay | Admitting: Family Medicine

## 2024-03-17 NOTE — Telephone Encounter (Unsigned)
 Copied from CRM [id number]. Topic: Clinical - Medication Question >> Mar 17, 2024  1:53 PM Carrielelia G wrote: Attn: pharmacist Mliss   Patient Maria [name] has a question regarding the medication: tirzepatide  (MOUNJARO ) 5 MG/0.5ML Pen  (Declined to state the question, when asked)   Please advise

## 2024-03-18 ENCOUNTER — Other Ambulatory Visit: Payer: Self-pay | Admitting: Family Medicine

## 2024-03-18 ENCOUNTER — Ambulatory Visit (INDEPENDENT_AMBULATORY_CARE_PROVIDER_SITE_OTHER): Admitting: *Deleted

## 2024-03-18 DIAGNOSIS — M810 Age-related osteoporosis without current pathological fracture: Secondary | ICD-10-CM

## 2024-03-18 DIAGNOSIS — E1169 Type 2 diabetes mellitus with other specified complication: Secondary | ICD-10-CM

## 2024-03-18 MED ORDER — DENOSUMAB-BBDZ 60 MG/ML ~~LOC~~ SOSY
60.0000 mg | PREFILLED_SYRINGE | Freq: Once | SUBCUTANEOUS | Status: AC
  Administered 2024-03-18: 60 mg via SUBCUTANEOUS

## 2024-03-18 NOTE — Progress Notes (Signed)
 Patient is in office today for a nurse visit for Jubbonti  injection.. Patient Injection was given in the  Right arm. Patient tolerated injection well.

## 2024-04-01 ENCOUNTER — Telehealth: Payer: Self-pay | Admitting: Pharmacist

## 2024-04-01 DIAGNOSIS — E119 Type 2 diabetes mellitus without complications: Secondary | ICD-10-CM

## 2024-04-01 NOTE — Telephone Encounter (Signed)
 [id number] placed to pharmacy.  Patient would like to discuss therapy and access.  There are also some medication list discrepancies that need to be resolved.  Patient will need appt with Pharmacy to discuss.   Wakeelah Solan Dattero Arben Packman, PharmD, BCACP, CPP Clinical Pharmacist, [facility]

## 2024-04-03 NOTE — Telephone Encounter (Addendum)
 Patient left me a message 03/31/24 requesting assistance with Mounjaro  medication.   I havent given a call back yet, but seen this note on her chart. Should I wait for someone to reach out from her referral? No assistance for Mounjaro .

## 2024-04-10 ENCOUNTER — Telehealth: Payer: Self-pay | Admitting: Pharmacist

## 2024-04-10 ENCOUNTER — Other Ambulatory Visit (INDEPENDENT_AMBULATORY_CARE_PROVIDER_SITE_OTHER)

## 2024-04-10 DIAGNOSIS — E119 Type 2 diabetes mellitus without complications: Secondary | ICD-10-CM

## 2024-04-10 DIAGNOSIS — Z7985 Long-term (current) use of injectable non-insulin antidiabetic drugs: Secondary | ICD-10-CM

## 2024-04-10 NOTE — Progress Notes (Signed)
 "  04/10/2024 Name: Maria [name] MRN: [id number] DOB: July 11, 1955  Chief Complaint  Patient presents with   Diabetes    Maria [name] is a 69 y.o. year old female who presented for a telephone visit.   They were referred to the pharmacist by their PCP for assistance in managing diabetes.    Subjective:  Care Team: Primary Care Provider: Severa Rock HERO, FNP ; Next Scheduled Visit: 04/2024  Medication Access/Adherence  Current Pharmacy:  [facility] [place], [place] - [address] [place] [address] Phone: [phone] Fax: [phone]  [facility] - [place], [place] - [address] [facility] [address] [place] [address] [place]  [address] Phone: [phone] Fax: [phone]  [facility] - [facility] [address] [place] [address] Phone: [phone] Fax: [phone]   Patient reports affordability concerns with their medications: Yes  Patient reports access/transportation concerns to their pharmacy: No  Patient reports adherence concerns with their medications:  Yes  Was told in January her cost would be $647 for her medications   Diabetes: History of diabetes > 10 years. Highest A1C 9.1% in 04/2022 when taking Farxiga , Glipizide , and Metformin  and started on Mounjaro  10/2022.  Current medications:  - Mounjaro  5 mg weekly - Farxiga  10 mg daily - Freestyle Libre, uses reader device  Medications tried in the past: Glipizide , Lantus , Metformin   Current glucose readings: A1C 7% on 11/30/23 - 138 mg/dL with some juice about 30 minutes prior  Date of Download: 01/31/2024 (read to me) Average Glucose: 143 mg/dL  Observed patterns:  Patient reports hypoglycemic episodes of 68-70 with libre but denies s/sx including dizziness, shakiness, sweating. Doesn't take anything to help, usually goes back to sleep.   Patient denies hyperglycemic symptoms including polyuria,  polydipsia, polyphagia, nocturia, neuropathy, blurred vision. Always tries to keep sugar < 250.  Macrovascular and Microvascular Risk Reduction:  Statin? yes (Rosuvastatin  20 mg daily); ACEi/ARB? yes (Lisinopril  5 mg daily) Last urinary albumin/creatinine ratio:  Lab Results  Component Value Date   MICRALBCREAT 11 02/29/2024   MICRALBCREAT 12 02/09/2023   MICRALBCREAT 28 02/01/2022   MICRALBCREAT 63 (H) 01/26/2021   MICRALBCREAT 26 02/03/2019   MICRALBCREAT 16.3 03/04/2018   MICRALBCREAT 28.4 01/31/2017   Last eye exam:  Lab Results  Component Value Date   HMDIABEYEEXA Retinopathy (A) 03/05/2023   Last foot exam: 11/30/2023 Tobacco Use:  Tobacco Use: Medium Risk (02/29/2024)   Patient History    Smoking Tobacco Use: Former    Smokeless Tobacco Use: Never    Passive Exposure: Not on file    Objective:  Lab Results  Component Value Date   HGBA1C 6.8 (H) 02/29/2024    Lab Results  Component Value Date   CREATININE 0.62 02/29/2024   BUN 11 02/29/2024   NA 143 02/29/2024   K 3.8 02/29/2024   CL 107 (H) 02/29/2024   CO2 22 02/29/2024    Lab Results  Component Value Date   CHOL 155 11/30/2023   HDL 56 11/30/2023   LDLCALC 79 11/30/2023   TRIG 113 11/30/2023   CHOLHDL 2.8 11/30/2023    Medications Reviewed Today     Reviewed by Billee Mliss BIRCH, RPH-CPP (Pharmacist) on 04/25/24 at [phone]  Med List Status: <None>   Medication Order Taking? Sig Documenting Provider Last Dose Status Informant  acetaminophen  (TYLENOL ) 500 MG tablet [id number]  Take 1,000 mg by mouth every 6 (six)  hours as needed for moderate pain. [provider]  Active Self  albuterol  (VENTOLIN  HFA) 108 (90 Base) MCG/ACT inhaler [id number]  Inhale 2 puffs into the lungs every 6 (six) hours as needed for wheezi ng orshortness of breath. Alphonsa Elsie RAMAN, MD  Active   amoxicillin -clavulanate (AUGMENTIN ) 875-125 MG tablet [id number]  Take 1 tablet by mouth 2 (two) times daily. Severa Rock HERO,  FNP  Active   bimatoprost (LUMIGAN) 0.01 % SOLN [id number]  Place 1 drop into both eyes at bedtime. [provider]  Active   calcium  carbonate (OSCAL) 1500 (600 Ca) MG TABS tablet [id number]  Take by mouth 2 (two) times daily with a meal. [provider]  Active   cetirizine (ZYRTEC) 10 MG tablet [id number]  1 tablet Orally Once a day for 30 day(s) [provider]  Active   Cholecalciferol (VITAMIN D3) 50 MCG (2000 UT) capsule [id number]  1 tablet [provider]  Active   Continuous Blood Gluc Receiver (FREESTYLE LIBRE 2 READER) DEVI [id number]  Use to test blood sugar continuously. DX: E11.65 Severa Rock HERO, FNP  Consider Medication Status and Discontinue (Change in therapy)   Continuous Glucose Sensor (FREESTYLE LIBRE 3 PLUS SENSOR) MISC [id number]  Change sensor every 15 days. DX:E11.65 Severa Rock HERO, FNP  Active   denosumab -bbdz (JUBBONTI ) 60 MG/ML SOSY injection [id number]  Inject 60 mg into the skin every 6 (six) months. Severa Rock HERO, FNP  Active   FARXIGA  10 MG TABS tablet [id number]  TAKE 1 TABLET DAILY Rakes, Linda M, FNP  Active   levothyroxine  (SYNTHROID ) 50 MCG tablet [id number]  TAKE ONE TABLET DAILY Rakes, Rock HERO, FNP  Active   lisinopril  (ZESTRIL ) 5 MG tablet [id number]  Take 1 tablet (5 mg total) by mouth daily. for high blood pressure Rakes, Rock HERO, FNP  Active   loperamide  (IMODIUM  A-D) 2 MG tablet [id number]  Take 1 tablet (2 mg total) by mouth 4 (four) times daily as needed for diarrhea or loose stools. Zackowski, Scott, MD  Active   LUMIGAN 0.01 % SOLN [id number]  Place 1 drop into both eyes at bedtime.  [provider]  Active Self  montelukast  (SINGULAIR ) 10 MG tablet [id number]  Take 1 tablet (10 mg total) by mouth daily. Severa Rock HERO, FNP  Active   ondansetron  (ZOFRAN ) 4 MG tablet [id number]  Take 1 tablet (4 mg total) by mouth every 8 (eight) hours as needed for nausea or vomiting. Severa Rock HERO, FNP  Active   pantoprazole  (PROTONIX )  40 MG tablet [id number]  Take 1 tablet (40 mg total) by mouth daily. Severa Rock HERO, FNP  Active   polyethylene glycol powder (GLYCOLAX /MIRALAX ) 17 GM/SCOOP powder [id number]  Take 17 g by mouth daily. Severa Rock HERO, FNP  Active   rosuvastatin  (CRESTOR ) 20 MG tablet [id number]  Take 1 tablet (20 mg total) by mouth daily. Severa Rock HERO, FNP  Active   tirzepatide  (MOUNJARO ) 5 MG/0.5ML Pen [id number]  Inject 5 mg into the skin once a week. DX:E11.65 Severa Rock HERO, FNP  Active   DOMINIC BECK 200-62.5-25 MCG/ACT AEPB [id number]  INHALE 1 PUFF ONCE DAILY Rakes, Rock HERO, FNP  Active            Med Note [place], POWELL HERO   Thu Jan 31, 2024  8:49 AM) Only uses seasonally or when sick, doesn't use regularly  triamcinolone  cream (KENALOG ) 0.1 % [id number]  Apply 1 application topically 2 (two) times daily as needed (dry skin). Waddell,  Malena M, DO  Active   vitamin B-12 (CYANOCOBALAMIN ) 1000 MCG tablet [id number]  Take 1,000 mcg by mouth daily. [provider]  Active               Assessment/Plan:   Diabetes: - Currently controlled; goal A1c <7%. Cardiorenal risk reduction is optimized.. Blood pressure not able to assess. LDL is not at goal.  - Reviewed long term cardiovascular and renal outcomes of uncontrolled blood sugar., Reviewed goal A1c, goal fasting, and goal 2 hour post prandial glucose. Recommended to check glucose two times daily, and Reviewed signs and symptoms of hypoglycemia. - Recommend to start Freestyle Libre 3 sensors ., Recommend to continue Mounjaro  5 mg weekly, Farxiga  10 mg daily. - Recommended to have something on hand for lows overnight, despite not dropping lower than 68 - Recommended to enroll in Medicare Prescription Payment Plan (M3P) with insurance for new year - Recommended to receive home delivery of Libre 3 Sensors, Mounjaro , and Farxiga  by end of year - Enroll in [employer] and assist with Extra Help/Low Income Subsidy enrollment   Follow Up Plan: 1 month with  Rock Bruns, beginning of year with pharmacy to assess affordability of medications.      Sylvestre Rathgeber Dattero Brinden Kincheloe, PharmD, BCACP, CPP Clinical Pharmacist, [facility]    "

## 2024-04-22 ENCOUNTER — Telehealth: Payer: Self-pay | Admitting: Family Medicine

## 2024-04-22 NOTE — Telephone Encounter (Unsigned)
 Copied from CRM [id number]. Topic: Clinical - Prescription Issue >> Apr 22, 2024  2:35 PM Kevelyn M wrote: Reason for CRM: Patient is calling because she is stating her pharmacy needs a prior authorization for Mounjaro . Patient is concerned because she doesn't have any medication for this week. A PA request has been put in but the patient wants to speak to [facility] Attempted call, but was on hold.   Call back # [phone]

## 2024-04-23 ENCOUNTER — Telehealth: Payer: Self-pay

## 2024-04-23 ENCOUNTER — Other Ambulatory Visit (HOSPITAL_COMMUNITY): Payer: Self-pay

## 2024-04-23 NOTE — Telephone Encounter (Signed)
 Rec'd a PA denial fax from [facility] for patients Mounjaro  AND for patients CGM.  Unsure of who/when PA's were submitted. Will resubmit to insurance.     Pharmacy Patient Advocate Encounter   Received notification from Physician's Office that prior authorization for MOUNJARO  5MG  is required/requested.   Insurance verification completed.   The patient is insured through [place].   Per test claim: PA required; PA started via CoverMyMeds. KEY [id number] . Waiting for clinical questions to populate.

## 2024-04-23 NOTE — Telephone Encounter (Signed)
New encounter created for PA

## 2024-04-23 NOTE — Telephone Encounter (Signed)
 Pharmacy Patient Advocate Encounter   Received notification from [employer] CMM KEY that prior authorization for FREESTYLE LIBRE 2 PLUS SENSOR is required/requested.   Insurance verification completed.   The patient is insured through [place].   Per test claim: PA required; PA started via CoverMyMeds. KEY [id number] . Waiting for clinical questions to populate.

## 2024-04-24 NOTE — Telephone Encounter (Signed)
 FYI for [place]  >> Apr 24, 2024  4:42 PM Chiquita SQUIBB wrote: Patient is calling in stating that she just spoke to her insurance company regarding this prior authorization being denied, patient stated that she submitted an appeal through her insurance company with reference number [id number], patient spoke with Amy with [facility] regarding this and was told to inform the office. She also provided a fax number of  [phone]. Patient stated she is out of the medication. Patient is requesting an update for the prior authorization.

## 2024-04-25 ENCOUNTER — Other Ambulatory Visit (HOSPITAL_COMMUNITY): Payer: Self-pay

## 2024-04-25 ENCOUNTER — Telehealth: Payer: Self-pay | Admitting: Pharmacist

## 2024-04-25 NOTE — Telephone Encounter (Signed)
 Patient aware and verbalized understanding. Patient she spoke with insurance company and she applied it she states she gave all the information to [place] assistance on the phone yesterday about the informations.

## 2024-04-25 NOTE — Telephone Encounter (Signed)
 Can you try to see what's going on with this Mounjaro ? Sorry, I'm not sure who completed original, but patient is out. Thank you!

## 2024-04-25 NOTE — Telephone Encounter (Signed)
 Can someone please place Farxiga  10mg  daily samples up front for patient and let her know when they are there? Lavern is working on her PA for Mounjaro --insurance is stallings We are out of Mounjaro  samples, but may have Ozempic  in my fridge if she is willing to switch I'm out of the office today at a conference Patient was to call insurance to determine next steps But I'm happy to f/u with her on Monday Thank you!!

## 2024-05-29 ENCOUNTER — Ambulatory Visit: Admitting: Family Medicine

## 2024-08-26 ENCOUNTER — Encounter (INDEPENDENT_AMBULATORY_CARE_PROVIDER_SITE_OTHER): Admitting: Ophthalmology
